# Patient Record
Sex: Female | Born: 1970
Health system: Southern US, Community
[De-identification: ages and names within clinical notes are randomized; demographics above are authoritative.]

## PROBLEM LIST (undated history)

## (undated) DIAGNOSIS — D219 Benign neoplasm of connective and other soft tissue, unspecified: Secondary | ICD-10-CM

## (undated) DIAGNOSIS — E039 Hypothyroidism, unspecified: Secondary | ICD-10-CM

## (undated) DIAGNOSIS — T7840XA Allergy, unspecified, initial encounter: Secondary | ICD-10-CM

## (undated) DIAGNOSIS — E7800 Pure hypercholesterolemia, unspecified: Secondary | ICD-10-CM

## (undated) DIAGNOSIS — D649 Anemia, unspecified: Secondary | ICD-10-CM

## (undated) DIAGNOSIS — E079 Disorder of thyroid, unspecified: Secondary | ICD-10-CM

## (undated) DIAGNOSIS — K5900 Constipation, unspecified: Secondary | ICD-10-CM

## (undated) HISTORY — DX: Anemia, unspecified: D64.9

## (undated) HISTORY — DX: Hypothyroidism, unspecified: E03.9

## (undated) HISTORY — DX: Benign neoplasm of connective and other soft tissue, unspecified: D21.9

## (undated) HISTORY — DX: Allergy, unspecified, initial encounter: T78.40XA

## (undated) HISTORY — DX: Pure hypercholesterolemia, unspecified: E78.00

## (undated) HISTORY — DX: Constipation, unspecified: K59.00

## (undated) HISTORY — PX: COLPOSCOPY: SHX161

## (undated) HISTORY — PX: TUBAL LIGATION: SHX77

## (undated) HISTORY — DX: Disorder of thyroid, unspecified: E07.9

---

## 2003-05-17 ENCOUNTER — Encounter (HOSPITAL_COMMUNITY): Admission: RE | Admit: 2003-05-17 | Discharge: 2003-08-15 | Payer: Self-pay | Admitting: Internal Medicine

## 2003-05-23 ENCOUNTER — Ambulatory Visit (HOSPITAL_COMMUNITY): Admission: RE | Admit: 2003-05-23 | Discharge: 2003-05-23 | Payer: Self-pay | Admitting: Internal Medicine

## 2003-05-24 ENCOUNTER — Encounter (HOSPITAL_COMMUNITY): Admission: RE | Admit: 2003-05-24 | Discharge: 2003-08-22 | Payer: Self-pay | Admitting: Internal Medicine

## 2003-05-24 ENCOUNTER — Encounter: Payer: Self-pay | Admitting: Internal Medicine

## 2003-09-10 DIAGNOSIS — E079 Disorder of thyroid, unspecified: Secondary | ICD-10-CM

## 2003-09-10 HISTORY — DX: Disorder of thyroid, unspecified: E07.9

## 2004-08-09 ENCOUNTER — Other Ambulatory Visit: Admission: RE | Admit: 2004-08-09 | Discharge: 2004-08-09 | Payer: Self-pay | Admitting: Gynecology

## 2005-02-25 ENCOUNTER — Inpatient Hospital Stay (HOSPITAL_COMMUNITY): Admission: AD | Admit: 2005-02-25 | Discharge: 2005-03-01 | Payer: Self-pay | Admitting: Gynecology

## 2005-04-12 ENCOUNTER — Other Ambulatory Visit: Admission: RE | Admit: 2005-04-12 | Discharge: 2005-04-12 | Payer: Self-pay | Admitting: Gynecology

## 2006-04-30 ENCOUNTER — Other Ambulatory Visit: Admission: RE | Admit: 2006-04-30 | Discharge: 2006-04-30 | Payer: Self-pay | Admitting: Gynecology

## 2017-06-28 ENCOUNTER — Ambulatory Visit (INDEPENDENT_AMBULATORY_CARE_PROVIDER_SITE_OTHER): Payer: BLUE CROSS/BLUE SHIELD | Admitting: Emergency Medicine

## 2017-06-28 ENCOUNTER — Encounter: Payer: Self-pay | Admitting: Emergency Medicine

## 2017-06-28 VITALS — BP 110/68 | HR 78 | Temp 98.6°F | Resp 16 | Ht 65.0 in | Wt 169.0 lb

## 2017-06-28 DIAGNOSIS — R399 Unspecified symptoms and signs involving the genitourinary system: Secondary | ICD-10-CM | POA: Diagnosis not present

## 2017-06-28 DIAGNOSIS — N390 Urinary tract infection, site not specified: Secondary | ICD-10-CM | POA: Diagnosis not present

## 2017-06-28 LAB — POCT URINALYSIS DIP (MANUAL ENTRY)
Bilirubin, UA: NEGATIVE
Glucose, UA: NEGATIVE mg/dL
Nitrite, UA: NEGATIVE
Protein Ur, POC: NEGATIVE mg/dL
Spec Grav, UA: >=1.03 — AB (ref 1.010–1.025)
Urobilinogen, UA: 0.2 E.U./dL
pH, UA: 5 (ref 5.0–8.0)

## 2017-06-28 MED ORDER — SULFAMETHOXAZOLE-TRIMETHOPRIM 800-160 MG PO TABS: 1.0000 | ORAL_TABLET | Freq: Two times a day (BID) | ORAL | 0 refills | Status: AC

## 2017-06-28 MED ORDER — FLUCONAZOLE 150 MG PO TABS: 150.0000 mg | ORAL_TABLET | Freq: Once | ORAL | 0 refills | Status: AC

## 2017-06-28 NOTE — Patient Instructions (Signed)
Infeccin urinaria en los adultos  (Urinary Tract Infection, Adult)  Una infeccin urinaria (IU) puede ocurrir en cualquier lugar de las vas urinarias. Las vas urinarias incluyen lo siguiente:   Riones.   Urteres.   Vejiga.   Uretra.  Estos rganos fabrican, almacenan y eliminan la orina del organismo.  CUIDADOS EN EL HOGAR   Tome los medicamentos de venta libre y los recetados solamente como se lo haya indicado el mdico.   Si le recetaron un antibitico, tmelo como se lo haya indicado el mdico. No deje de tomar los antibiticos aunque comience a sentirse mejor.   Evite beber lo siguiente:  ? Alcohol.  ? Cafena.  ? T.  ? Bebidas con gas.   Beba suficiente lquido para mantener el pis claro o de color amarillo plido.   Concurra a todas las visitas de control como se lo haya indicado el mdico. Esto es importante.   Asegrese de lo siguiente:  ? Vaciar la vejiga con frecuencia y en su totalidad. No contener la orina durante largos perodos.  ? Vaciar la vejiga antes y despus de tener relaciones sexuales.  ? Limpiar de adelante hacia atrs despus de defecar, si es mujer. Usar cada trozo de papel una vez cuando se limpie.  SOLICITE AYUDA SI:   Siente dolor en la espalda.   Tiene fiebre.   Siente malestar estomacal (nuseas).   Vomita.   Los sntomas no mejoran despus de 3das de tratamiento.   Los sntomas desaparecen y luego reaparecen.  SOLICITE AYUDA DE INMEDIATO SI:   Siente un dolor muy intenso en la espalda.   Siente un dolor muy intenso en la parte inferior del abdomen.   Tiene vmitos y no puede retener los medicamentos ni el agua.  Esta informacin no tiene como fin reemplazar el consejo del mdico. Asegrese de hacerle al mdico cualquier pregunta que tenga.  Document Released: 02/13/2010 Document Revised: 12/18/2015 Document Reviewed: 07/17/2015  Elsevier Interactive Patient Education  2018 Elsevier Inc.

## 2017-06-28 NOTE — Progress Notes (Signed)
Kelli Hale 46 y.o.   Chief Complaint  Patient presents with  . Urinary Frequency    with vaginal discharge x 2 weeks and hurts to pee    HISTORY OF PRESENT ILLNESS: This is a 46 y.o. female complaining of UTI symptoms x 2 weeks.  Urinary Tract Infection   This is a new problem. The current episode started 1 to 4 weeks ago. The problem occurs every urination. The problem has been gradually worsening. The quality of the pain is described as burning. The pain is at a severity of 5/10. The pain is moderate. There has been no fever. She is not sexually active. There is no history of pyelonephritis. Associated symptoms include a discharge (white), frequency and urgency. Pertinent negatives include no chills, flank pain, hematuria, nausea, possible pregnancy or vomiting. She has tried nothing for the symptoms. There is no history of catheterization, kidney stones, recurrent UTIs or a urological procedure.     Prior to Admission medications   Not on File    No Known Allergies  There are no active problems to display for this patient.   No past medical history on file.  No past surgical history on file.  Social History   Social History  . Marital status: Single    Spouse name: N/A  . Number of children: N/A  . Years of education: N/A   Occupational History  . Not on file.   Social History Main Topics  . Smoking status: Never Smoker  . Smokeless tobacco: Never Used  . Alcohol use No  . Drug use: No  . Sexual activity: Not on file   Other Topics Concern  . Not on file   Social History Narrative  . No narrative on file    No family history on file.   Review of Systems  Constitutional: Negative for chills, diaphoresis and fever.  HENT: Negative.  Negative for sore throat.   Eyes: Negative.   Respiratory: Negative.  Negative for cough and shortness of breath.   Cardiovascular: Negative.  Negative for chest pain and palpitations.  Gastrointestinal:  Negative.  Negative for abdominal pain, blood in stool, diarrhea, melena, nausea and vomiting.  Genitourinary: Positive for dysuria, frequency and urgency. Negative for flank pain and hematuria.  Musculoskeletal: Negative for back pain and myalgias.  Skin: Negative.  Negative for rash.  Neurological: Negative.  Negative for dizziness, sensory change, focal weakness and headaches.  Endo/Heme/Allergies: Negative.   All other systems reviewed and are negative.  Vitals:   06/28/17 0941  BP: 110/68  Pulse: 78  Resp: 16  Temp: 98.6 F (37 C)  SpO2: 98%     Physical Exam  Constitutional: She is oriented to person, place, and time. She appears well-developed and well-nourished.  HENT:  Head: Normocephalic and atraumatic.  Right Ear: External ear normal.  Mouth/Throat: Oropharynx is clear and moist.  Eyes: Pupils are equal, round, and reactive to light. Conjunctivae are normal.  Neck: Normal range of motion. Neck supple.  Cardiovascular: Normal rate, regular rhythm and normal heart sounds.   Pulmonary/Chest: Effort normal and breath sounds normal.  Abdominal: Soft. Bowel sounds are normal. She exhibits no distension. There is no tenderness.  Musculoskeletal: Normal range of motion.  Neurological: She is alert and oriented to person, place, and time. No sensory deficit. She exhibits normal muscle tone.  Skin: Skin is warm and dry. Capillary refill takes less than 2 seconds.  Psychiatric: She has a normal mood and affect. Her behavior is  normal.  Vitals reviewed.    ASSESSMENT & PLAN: Kelli Hale was seen today for urinary frequency.  Diagnoses and all orders for this visit:  Lower urinary tract symptoms (LUTS) -     Urine Culture -     POCT urinalysis dipstick -     fluconazole (DIFLUCAN) 150 MG tablet; Take 1 tablet (150 mg total) by mouth once.  Acute UTI -     sulfamethoxazole-trimethoprim (BACTRIM DS,SEPTRA DS) 800-160 MG tablet; Take 1 tablet by mouth 2 (two) times  daily.    Patient Instructions  Infeccin urinaria en los adultos (Urinary Tract Infection, Adult) Una infeccin urinaria (IU) puede ocurrir en Clinical cytogeneticist de las vas urinarias. Las vas urinarias incluyen lo siguiente:  Riones.  Urteres.  Vejiga.  Uretra. Estos rganos fabrican, Buyer, retail y eliminan la orina del organismo. [place] los medicamentos de venta libre y los recetados solamente como se lo haya indicado el mdico.  Si le recetaron un antibitico, tmelo como se lo haya indicado el mdico. No deje de tomar los antibiticos aunque comience a sentirse mejor.  Evite beber lo siguiente: ? Alcohol. ? Cafena. ? T. ? Bebidas con gas.  Beba suficiente lquido para mantener el pis claro o de color amarillo plido.  Concurra a todas las visitas de control como se lo haya indicado el mdico. Esto es importante.  Asegrese de lo siguiente: ? Vaciar la vejiga con frecuencia y en su totalidad. No contener la orina durante largos perodos. ? Vaciar la vejiga antes y despus de Clinical biochemist. ? Limpiar de adelante hacia atrs despus de defecar, si es mujer. Usar cada trozo de papel una vez cuando se limpie. SOLICITE AYUDA SI:  Siente dolor en la espalda.  Tiene fiebre.  Siente malestar estomacal (nuseas).  Vomita.  Los sntomas no mejoran despus de 3das de tratamiento.  Los sntomas desaparecen y Teacher, adult education. SOLICITE AYUDA DE INMEDIATO SI:  Siente un dolor muy intenso en la espalda.  Siente un dolor muy intenso en la parte inferior del abdomen.  Tiene vmitos y no puede retener los medicamentos ni el agua. Esta informacin no tiene Marine scientist el consejo del mdico. Asegrese de hacerle al mdico cualquier pregunta que tenga. Document Released: 02/13/2010 Document Revised: 12/18/2015 Document Reviewed: 07/17/2015 Elsevier Interactive Patient Education  2018 Elsevier Inc.      Agustina Caroli,  MD [facility]

## 2017-06-29 LAB — URINE CULTURE

## 2017-07-01 ENCOUNTER — Encounter: Payer: Self-pay | Admitting: Radiology

## 2017-09-09 DIAGNOSIS — E039 Hypothyroidism, unspecified: Secondary | ICD-10-CM

## 2017-09-09 HISTORY — DX: Hypothyroidism, unspecified: E03.9

## 2018-01-02 ENCOUNTER — Other Ambulatory Visit: Payer: Self-pay

## 2018-01-02 ENCOUNTER — Encounter: Payer: Self-pay | Admitting: Emergency Medicine

## 2018-01-02 ENCOUNTER — Ambulatory Visit (INDEPENDENT_AMBULATORY_CARE_PROVIDER_SITE_OTHER): Payer: BLUE CROSS/BLUE SHIELD

## 2018-01-02 ENCOUNTER — Ambulatory Visit (INDEPENDENT_AMBULATORY_CARE_PROVIDER_SITE_OTHER): Payer: BLUE CROSS/BLUE SHIELD | Admitting: Emergency Medicine

## 2018-01-02 VITALS — BP 110/80 | HR 63 | Temp 98.7°F | Resp 16 | Ht 64.5 in | Wt 165.2 lb

## 2018-01-02 DIAGNOSIS — R1010 Upper abdominal pain, unspecified: Secondary | ICD-10-CM | POA: Diagnosis not present

## 2018-01-02 DIAGNOSIS — Z23 Encounter for immunization: Secondary | ICD-10-CM

## 2018-01-02 LAB — POCT CBC
Granulocyte percent: 58 %G (ref 37–80)
HCT, POC: 31.9 % — AB (ref 37.7–47.9)
Hemoglobin: 10 g/dL — AB (ref 12.2–16.2)
Lymph, poc: 2.2 (ref 0.6–3.4)
MCH, POC: 21.5 pg — AB (ref 27–31.2)
MCHC: 31.4 g/dL — AB (ref 31.8–35.4)
MCV: 68.4 fL — AB (ref 80–97)
MID (cbc): 0.3 (ref 0–0.9)
MPV: 8.1 fL (ref 0–99.8)
POC Granulocyte: 3.4 (ref 2–6.9)
POC LYMPH PERCENT: 36.5 %L (ref 10–50)
POC MID %: 5.5 %M (ref 0–12)
Platelet Count, POC: 317 10*3/uL (ref 142–424)
RBC: 4.67 M/uL (ref 4.04–5.48)
RDW, POC: 18.1 %
WBC: 5.9 10*3/uL (ref 4.6–10.2)

## 2018-01-02 LAB — POCT URINALYSIS DIP (MANUAL ENTRY)
Bilirubin, UA: NEGATIVE
Blood, UA: NEGATIVE
Glucose, UA: NEGATIVE mg/dL
Ketones, POC UA: NEGATIVE mg/dL
Nitrite, UA: NEGATIVE
Protein Ur, POC: NEGATIVE mg/dL
Spec Grav, UA: 1.025 (ref 1.010–1.025)
Urobilinogen, UA: 0.2 E.U./dL
pH, UA: 6 (ref 5.0–8.0)

## 2018-01-02 MED ORDER — OMEPRAZOLE 40 MG PO CPDR
40.0000 mg | DELAYED_RELEASE_CAPSULE | Freq: Every day | ORAL | 3 refills | Status: DC
Start: 2018-01-02 — End: 2018-01-16

## 2018-01-02 NOTE — Progress Notes (Signed)
Kelli Hale 47 y.o.   Chief Complaint  Patient presents with  . Abdominal Pain    x 8 days  . Nausea    HISTORY OF PRESENT ILLNESS: This is a 47 y.o. female complaining of upper abdominal pain for the past 7 to 8 days with nausea.  Has fullness sensation in the upper abdomen with early satiety.  Denies vomiting.  Denies diarrhea.  Denies rectal bleeding.  No significant past medical history.  On no medications.  Non-smoker.  [place] abuser.  Has a history of anemia.  Denies melena.  Abdominal Pain  This is a new problem. The current episode started 1 to 4 weeks ago. The onset quality is gradual. The problem occurs intermittently. The problem has been waxing and waning. The pain is located in the epigastric region. The pain is at a severity of 5/10. The pain is moderate. The quality of the pain is dull and a sensation of fullness. The abdominal pain does not radiate. Associated symptoms include nausea. Pertinent negatives include no anorexia, diarrhea, dysuria, fever, frequency, headaches, hematochezia, hematuria, melena, myalgias, vomiting or weight loss. The pain is aggravated by eating. The pain is relieved by nothing. She has tried nothing for the symptoms. There is no history of abdominal surgery, colon cancer, Crohn's disease, gallstones, GERD, pancreatitis, PUD or ulcerative colitis.     Prior to Admission medications   Not on File    No Known Allergies  Patient Active Problem List   Diagnosis Date Noted  . Lower urinary tract symptoms (LUTS) 06/28/2017  . Acute UTI 06/28/2017    No past medical history on file.    Social History   Socioeconomic History  . Marital status: Single    Spouse name: Not on file  . Number of children: Not on file  . Years of education: Not on file  . Highest education level: Not on file  Occupational History  . Not on file  Social Needs  . Financial resource strain: Not on file  . Food insecurity:    Worry: Not on file   Inability: Not on file  . Transportation needs:    Medical: Not on file    Non-medical: Not on file  Tobacco Use  . Smoking status: Never Smoker  . Smokeless tobacco: Never Used  Substance and Sexual Activity  . Alcohol use: No  . Drug use: No  . Sexual activity: Not on file  Lifestyle  . Physical activity:    Days per week: Not on file    Minutes per session: Not on file  . Stress: Not on file  Relationships  . Social connections:    Talks on phone: Not on file    Gets together: Not on file    Attends religious service: Not on file    Active member of club or organization: Not on file    Attends meetings of clubs or organizations: Not on file    Relationship status: Not on file  . Intimate partner violence:    Fear of current or ex partner: Not on file    Emotionally abused: Not on file    Physically abused: Not on file    Forced sexual activity: Not on file  Other Topics Concern  . Not on file  Social History Narrative  . Not on file    Family History  Problem Relation Age of Onset  . Heart disease Mother      Review of Systems  Constitutional: Negative.  Negative  for chills, fever and weight loss.  HENT: Negative.  Negative for nosebleeds, sinus pain and sore throat.   Eyes: Negative.  Negative for blurred vision and double vision.  Respiratory: Negative.  Negative for cough, hemoptysis and shortness of breath.   Cardiovascular: Negative.  Negative for chest pain, palpitations, claudication and leg swelling.  Gastrointestinal: Positive for abdominal pain and nausea. Negative for anorexia, blood in stool, diarrhea, heartburn, hematochezia, melena and vomiting.  Genitourinary: Negative.  Negative for dysuria, frequency and hematuria.  Musculoskeletal: Negative for back pain, myalgias and neck pain.  Skin: Negative.  Negative for rash.  Neurological: Negative.  Negative for dizziness and headaches.  Endo/Heme/Allergies: Negative.   All other systems reviewed and  are negative.   Vitals:   01/02/18 1629  BP: 110/80  Pulse: 63  Resp: 16  Temp: 98.7 F (37.1 C)  SpO2: 98%    Physical Exam  Constitutional: She is oriented to person, place, and time. She appears well-developed and well-nourished.  HENT:  Head: Normocephalic and atraumatic.  Nose: Nose normal.  Mouth/Throat: Oropharynx is clear and moist.  Eyes: Pupils are equal, round, and reactive to light. Conjunctivae and EOM are normal.  Neck: Normal range of motion. Neck supple. No JVD present. No thyromegaly present.  Cardiovascular: Normal rate, regular rhythm and normal heart sounds.  Pulmonary/Chest: Effort normal and breath sounds normal. No respiratory distress.  Abdominal: Soft. Normal appearance and bowel sounds are normal. She exhibits no distension, no abdominal bruit, no ascites and no mass. There is no hepatosplenomegaly. There is tenderness in the epigastric area. There is no rigidity, no rebound, no guarding and no CVA tenderness.  Musculoskeletal: Normal range of motion.  Lymphadenopathy:    She has no cervical adenopathy.  Neurological: She is alert and oriented to person, place, and time. No sensory deficit. She exhibits normal muscle tone.  Skin: Skin is warm and dry. Capillary refill takes less than 2 seconds. No rash noted.  Psychiatric: She has a normal mood and affect. Her behavior is normal.  Vitals reviewed.  Results for orders placed or performed in visit on 01/02/18 (from the past 24 hour(s))  POCT CBC     Status: Abnormal   Collection Time: 01/02/18  5:05 PM  Result Value Ref Range   WBC 5.9 4.6 - 10.2 K/uL   Lymph, poc 2.2 0.6 - 3.4   POC LYMPH PERCENT 36.5 10 - 50 %L   MID (cbc) 0.3 0 - 0.9   POC MID % 5.5 0 - 12 %M   POC Granulocyte 3.4 2 - 6.9   Granulocyte percent 58.0 37 - 80 %G   RBC 4.67 4.04 - 5.48 M/uL   Hemoglobin 10.0 (A) 12.2 - 16.2 g/dL   HCT, POC 31.9 (A) 37.7 - 47.9 %   MCV 68.4 (A) 80 - 97 fL   MCH, POC 21.5 (A) 27 - 31.2 pg   MCHC  31.4 (A) 31.8 - 35.4 g/dL   RDW, POC 18.1 %   Platelet Count, POC 317 142 - 424 K/uL   MPV 8.1 0 - 99.8 fL  POCT urinalysis dipstick     Status: Abnormal   Collection Time: 01/02/18  5:08 PM  Result Value Ref Range   Color, UA yellow yellow   Clarity, UA clear clear   Glucose, UA negative negative mg/dL   Bilirubin, UA negative negative   Ketones, POC UA negative negative mg/dL   Spec Grav, UA 1.025 1.010 - 1.025   Blood, UA  negative negative   pH, UA 6.0 5.0 - 8.0   Protein Ur, POC negative negative mg/dL   Urobilinogen, UA 0.2 0.2 or 1.0 E.U./dL   Nitrite, UA Negative Negative   Leukocytes, UA Trace (A) Negative   Dg Abd Acute W/chest  Result Date: 01/02/2018 CLINICAL DATA:  Upper abdominal pain. EXAM: DG ABDOMEN ACUTE W/ 1V CHEST COMPARISON:  No recent. FINDINGS: Mediastinum hilar structures normal. Lungs are clear. No pleural effusion or pneumothorax. Heart size normal. Soft tissue structures the abdomen are unremarkable. No bowel distention. Stool noted throughout the colon. Faint opacity noted over the left upper abdomen most likely represents debris in the stomach. Mild thoracolumbar spine scoliosis.  No acute bony abnormality. IMPRESSION: 1.  No acute cardiopulmonary disease. 2.  No acute intra-abdominal abnormality. Electronically Signed   By: Marcello Moores  Register   On: 01/02/2018 17:07   A total of 40 minutes was spent in the room with the patient, greater than 50% of which was in counseling/coordination of care regarding differential diagnosis, management, medications, diet, diagnostic imaging, and follow-up with GI.  Blood work and x-rays reviewed with the patient in the room.   ASSESSMENT & PLAN: [name] was seen today for abdominal pain and nausea.  Diagnoses and all orders for this visit:  Upper abdominal pain -     POCT CBC -     POCT urinalysis dipstick -     Comprehensive metabolic panel -     Lipase -     DG Abd Acute W/Chest; Future -     omeprazole (PRILOSEC) 40  MG capsule; Take 1 capsule (40 mg total) by mouth daily. -     Ambulatory referral to Gastroenterology  Need for diphtheria-tetanus-pertussis (Tdap) vaccine -     Tdap vaccine greater than or equal to 7yo IM    Patient Instructions       IF you received an x-ray today, you will receive an invoice from [facility] Rad[facility]. Please contact [facility] Radiology at [phone] with questions or concerns regarding your invoice.   IF you received labwork today, you will receive an invoice from [place]. Please contact [facility] at [phone] with questions or concerns regarding your invoice.   Our billing staff will not be able to assist you with questions regarding bills from these companies.  You will be contacted with the lab results as soon as they are available. The fastest way to get your results is to activate your My Chart account. Instructions are located on the last page of this paperwork. If you have not heard from [place] regarding the results in 2 weeks, please contact this office.     Dolor abdominal en los adultos Abdominal Pain, Adult El dolor de [place] (abdominal) puede tener muchas causas. [place] veces, el dolor de [place] no es peligroso. Muchos de [employer] de dolor de estmago pueden controlarse y tratarse en casa. Sin embargo, a Clinical cytogeneticist, Conservation officer, historic buildings de [employer] puede ser grave. El mdico intentar descubrir la causa del dolor de [place]. Siga estas indicaciones en su casa:  [place] los medicamentos de venta libre y los recetados solamente como se lo haya indicado el mdico. No tome medicamentos que lo ayuden a Landscape architect (laxantes), salvo que el mdico se lo indique.  Beba suficiente lquido para mantener el pis (orina) claro o de color amarillo plido.  Est atento al dolor de estmago para Actuary cambio.  Concurra a todas las visitas de control como se lo haya indicado el mdico. Esto  es importante. Comunquese con un mdico si:  El dolor de  estmago cambia o [place].  No tiene apetito o baja de peso sin proponrselo.  Tiene dificultades para defecar (est estreido) o heces lquidas (diarrea) durante ms de 2 o 3das.  Siente dolor al orinar o defecar.  El dolor de estmago lo despierta de noche.  El dolor empeora con las comidas, despus de comer o con determinados alimentos.  Vomita y no puede retener nada de lo que ingiere.  Tiene fiebre. Solicite ayuda de inmediato si:  El dolor no desaparece en el tiempo indicado por el mdico.  No puede detener los vmitos.  Siente dolor solamente en zonas especficas del abdomen, como el lado derecho o la parte inferior izquierda.  Tiene heces con sangre, de color negro o con aspecto alquitranado.  Tiene dolor muy intenso en el vientre, clicos o meteorismo.  Presenta signos de no tener suficientes lquidos o agua en el cuerpo (deshidratacin), por ejemplo: ? [place] es [place], es muy escasa o no orina. ? Labios agrietados. ? [employer]. ? Ojos hundidos. ? Somnolencia. ? Debilidad. Esta informacin no tiene Marine scientist el consejo del mdico. Asegrese de hacerle al mdico cualquier pregunta que tenga. Document Released: 11/22/2008 Document Revised: 11/20/2016 Document Reviewed: 02/07/2016 Elsevier Interactive Patient Education  2018 Elsevier Inc.      Agustina Caroli, MD [facility]

## 2018-01-02 NOTE — Patient Instructions (Addendum)
     IF you received an x-ray today, you will receive an invoice from [facility] Radiology. Please contact [facility] Radiology at [phone] with questions or concerns regarding your invoice.   IF you received labwork today, you will receive an invoice from [place]. Please contact [facility] at [phone] with questions or concerns regarding your invoice.   Our billing staff will not be able to assist you with questions regarding bills from these companies.  You will be contacted with the lab results as soon as they are available. The fastest way to get your results is to activate your My Chart account. Instructions are located on the last page of this paperwork. If you have not heard from [place] regarding the results in 2 weeks, please contact this office.     Dolor abdominal en los adultos Abdominal Pain, Adult El dolor de [place] (abdominal) puede tener muchas causas. [place] veces, el dolor de [place] no es peligroso. Muchos de [employer] de dolor de estmago pueden controlarse y tratarse en casa. Sin embargo, a Clinical cytogeneticist, Conservation officer, historic buildings de [employer] puede ser grave. El mdico intentar descubrir la causa del dolor de [place]. Siga estas indicaciones en su casa:  Tome los medicamentos de venta libre y los recetados solamente como se lo haya indicado el mdico. No tome medicamentos que lo ayuden a Landscape architect (laxantes), salvo que el mdico se lo indique.  Beba suficiente lquido para mantener el pis (orina) claro o de color amarillo plido.  Est atento al dolor de estmago para [employer]uary cambio.  Concurra a todas las visitas de control como se lo haya indicado el mdico. Esto es importante. Comunquese con un mdico si:  El dolor de estmago cambia o [place].  No tiene apetito o baja de peso sin proponrselo.  Tiene dificultades para defecar (est estreido) o heces lquidas (diarrea) durante ms de 2 o 3das.  Siente dolor al orinar o defecar.  El dolor de estmago  lo despierta de noche.  El dolor empeora con las comidas, despus de comer o con determinados alimentos.  Vomita y no puede retener nada de lo que ingiere.  Tiene fiebre. Solicite ayuda de inmediato si:  El dolor no desaparece en el tiempo indicado por el mdico.  No puede detener los vmitos.  Siente dolor solamente en zonas especficas del abdomen, como el lado derecho o la parte inferior izquierda.  Tiene heces con sangre, de color negro o con aspecto alquitranado.  Tiene dolor muy intenso en el vientre, clicos o meteorismo.  Presenta signos de no tener suficientes lquidos o agua en el cuerpo (deshidratacin), por ejemplo: ? [place] es [place], es muy escasa o no orina. ? Labios agrietados. ? [employer]. ? Ojos hundidos. ? Somnolencia. ? Debilidad. Esta informacin no tiene Marine scientist el consejo del mdico. Asegrese de hacerle al mdico cualquier pregunta que tenga. Document Released: 11/22/2008 Document Revised: 11/20/2016 Document Reviewed: 02/07/2016 Elsevier Interactive Patient Education  [employer].

## 2018-01-03 ENCOUNTER — Encounter: Payer: Self-pay | Admitting: Radiology

## 2018-01-03 LAB — COMPREHENSIVE METABOLIC PANEL
ALT: 17 IU/L (ref 0–32)
AST: 24 IU/L (ref 0–40)
Albumin/Globulin Ratio: 1.6 (ref 1.2–2.2)
Albumin: 4.4 g/dL (ref 3.5–5.5)
Alkaline Phosphatase: 88 IU/L (ref 39–117)
BUN/Creatinine Ratio: 19 (ref 9–23)
BUN: 15 mg/dL (ref 6–24)
Bilirubin Total: 0.3 mg/dL (ref 0.0–1.2)
CO2: 24 mmol/L (ref 20–29)
Calcium: 8.9 mg/dL (ref 8.7–10.2)
Chloride: 104 mmol/L (ref 96–106)
Creatinine, Ser: 0.77 mg/dL (ref 0.57–1.00)
GFR calc Af Amer: 107 mL/min/{1.73_m2} (ref >59)
GFR calc non Af Amer: 93 mL/min/{1.73_m2} (ref >59)
Globulin, Total: 2.8 g/dL (ref 1.5–4.5)
Glucose: 85 mg/dL (ref 65–99)
Potassium: 3.6 mmol/L (ref 3.5–5.2)
Sodium: 140 mmol/L (ref 134–144)
Total Protein: 7.2 g/dL (ref 6.0–8.5)

## 2018-01-03 LAB — LIPASE: Lipase: 34 U/L (ref 14–72)

## 2018-01-16 ENCOUNTER — Encounter: Payer: Self-pay | Admitting: Gastroenterology

## 2018-01-16 ENCOUNTER — Other Ambulatory Visit: Payer: Self-pay

## 2018-01-16 ENCOUNTER — Ambulatory Visit (AMBULATORY_SURGERY_CENTER): Payer: BLUE CROSS/BLUE SHIELD | Admitting: Gastroenterology

## 2018-01-16 ENCOUNTER — Other Ambulatory Visit (INDEPENDENT_AMBULATORY_CARE_PROVIDER_SITE_OTHER): Payer: BLUE CROSS/BLUE SHIELD

## 2018-01-16 ENCOUNTER — Ambulatory Visit (INDEPENDENT_AMBULATORY_CARE_PROVIDER_SITE_OTHER): Payer: BLUE CROSS/BLUE SHIELD | Admitting: Gastroenterology

## 2018-01-16 VITALS — BP 110/76 | HR 60 | Ht 65.0 in | Wt 164.4 lb

## 2018-01-16 VITALS — BP 127/81 | HR 52 | Temp 98.4°F | Resp 13 | Ht 65.0 in | Wt 164.0 lb

## 2018-01-16 DIAGNOSIS — R1013 Epigastric pain: Secondary | ICD-10-CM | POA: Diagnosis not present

## 2018-01-16 DIAGNOSIS — D649 Anemia, unspecified: Secondary | ICD-10-CM | POA: Diagnosis not present

## 2018-01-16 DIAGNOSIS — K5900 Constipation, unspecified: Secondary | ICD-10-CM

## 2018-01-16 DIAGNOSIS — K2950 Unspecified chronic gastritis without bleeding: Secondary | ICD-10-CM | POA: Diagnosis not present

## 2018-01-16 HISTORY — PX: UPPER GASTROINTESTINAL ENDOSCOPY: SHX188

## 2018-01-16 LAB — FERRITIN: Ferritin: 2.6 ng/mL — ABNORMAL LOW (ref 10.0–291.0)

## 2018-01-16 MED ORDER — SODIUM CHLORIDE 0.9 % IV SOLN: 500.0000 mL | Freq: Once | INTRAVENOUS | Status: DC

## 2018-01-16 MED ORDER — OMEPRAZOLE 40 MG PO CPDR: 40.0000 mg | DELAYED_RELEASE_CAPSULE | Freq: Two times a day (BID) | ORAL | 3 refills | Status: DC

## 2018-01-16 MED ORDER — FERROUS SULFATE 325 (65 FE) MG PO TABS: 325.0000 mg | ORAL_TABLET | Freq: Two times a day (BID) | ORAL | 3 refills | Status: DC

## 2018-01-16 NOTE — Op Note (Signed)
[place] Patient Name: Kelli Hale Procedure Date: 01/16/2018 3:36 PM MRN: [id number] Endoscopist: Remo Lipps P. Armbruster MD, MD Age: 47 Referring MD:  Date of Birth: 06-24-1971 Gender: Female Account #: [id number] Procedure:                Upper GI endoscopy Indications:              Epigastric abdominal pain, Iron deficiency anemia Medicines:                Monitored Anesthesia Care Procedure:                Pre-Anesthesia Assessment:                           - Prior to the procedure, a History and Physical                            was performed, and patient medications and                            allergies were reviewed. The patient's tolerance of                            previous anesthesia was also reviewed. The risks                            and benefits of the procedure and the sedation                            options and risks were discussed with the patient.                            All questions were answered, and informed consent                            was obtained. Prior Anticoagulants: The patient has                            taken no previous anticoagulant or antiplatelet                            agents. ASA Grade Assessment: II - A patient with                            mild systemic disease. After reviewing the risks                            and benefits, the patient was deemed in                            satisfactory condition to undergo the procedure.                           After obtaining informed consent, the endoscope was  passed under direct vision. Throughout the                            procedure, the patient's blood pressure, pulse, and                            oxygen saturations were monitored continuously. The                            Endoscope was introduced through the mouth, and                            advanced to the second part of duodenum. The upper            GI endoscopy was accomplished without difficulty.                            The patient tolerated the procedure well. Scope In: Scope Out: Findings:                 Esophagogastric landmarks were identified: the                            Z-line was found at 37 cm, the gastroesophageal                            junction was found at 37 cm and the upper extent of                            the gastric folds was found at 37 cm from the                            incisors.                           The exam of the esophagus was otherwise normal.                           The entire examined stomach was normal. Biopsies                            were taken from the antrum, body, and incisura with                            a cold forceps for Helicobacter pylori testing.                           The duodenal bulb and second portion of the                            duodenum were normal. Complications:            No immediate complications. Estimated blood loss:  Minimal. Estimated Blood Loss:     Estimated blood loss was minimal. Impression:               - Esophagogastric landmarks identified.                           - Normal esophagus.                           - Normal stomach. Biopsied to rule out H pylori                            given iron deficiency / epigastric pain.                           - Normal duodenal bulb and second portion of the                            duodenum.                           No overt cause for pain or iron deficiency anemia                            on this exam. Recommendation:           - Patient has a contact number available for                            emergencies. The signs and symptoms of potential                            delayed complications were discussed with the                            patient. Return to normal activities tomorrow.                            Written discharge instructions were  provided to the                            patient.                           - Resume previous diet.                           - Continue present medications.                           - Start ferrous sulfate 325mg  twice daily, Miralax                            daily for constipation                           - Await pathology results.                           -  Recommend US of the RUQ to assess for gallstones                           - Recommend colonoscopy to clear the colon in                            regards to iron deficiency. It's possible the                            anemia is otherwise due to menstrual blood loss.                            Will discuss with patient Kelli Hale. Armbruster MD, MD 01/16/2018 4:01:12 PM This report has been signed electronically.

## 2018-01-16 NOTE — Patient Instructions (Signed)
YOU HAD AN ENDOSCOPIC PROCEDURE TODAY AT [facility]:   Refer to the procedure report that was given to you for any specific questions about what was found during the examination.  If the procedure report does not answer your questions, please call your gastroenterologist to clarify.  If you requested that your care partner not be given the details of your procedure findings, then the procedure report has been included in a sealed envelope for you to review at your convenience later.  YOU SHOULD EXPECT: Some feelings of bloating in the abdomen. Passage of more gas than usual.  Walking can help get rid of the air that was put into your GI tract during the procedure and reduce the bloating. If you had a lower endoscopy (such as a colonoscopy or flexible sigmoidoscopy) you may notice spotting of blood in your stool or on the toilet paper. If you underwent a bowel prep for your procedure, you may not have a normal bowel movement for a few days.  Please Note:  You might notice some irritation and congestion in your nose or some drainage.  This is from the oxygen used during your procedure.  There is no need for concern and it should clear up in a day or so.  SYMPTOMS TO REPORT IMMEDIATELY:    Following upper endoscopy (EGD)  Vomiting of blood or coffee ground material  New chest pain or pain under the shoulder blades  Painful or persistently difficult swallowing  New shortness of breath  Fever of 100F or higher  Black, tarry-looking stools  For urgent or emergent issues, a gastroenterologist can be reached at any hour by calling [phone].   DIET:  We do recommend a small meal at first, but then you may proceed to your regular diet.  Drink plenty of fluids but you should avoid alcoholic beverages for 24 hours.  ACTIVITY:  You should plan to take it easy for the rest of today and you should NOT DRIVE or use heavy machinery until tomorrow (because of the sedation medicines used  during the test).    FOLLOW UP: Our staff will call the number listed on your records the next business day following your procedure to check on you and address any questions or concerns that you may have regarding the information given to you following your procedure. If we do not reach you, we will leave a message.  However, if you are feeling well and you are not experiencing any problems, there is no need to return our call.  We will assume that you have returned to your regular daily activities without incident.  If any biopsies were taken you will be contacted by phone or by letter within the next 1-3 weeks.  Please call us at [phone] if you have not heard about the biopsies in 3 weeks.    SIGNATURES/CONFIDENTIALITY: You and/or your care partner have signed paperwork which will be entered into your electronic medical record.  These signatures attest to the fact that that the information above on your After Visit Summary has been reviewed and is understood.  Full responsibility of the confidentiality of this discharge information lies with you and/or your care-partner.   Ferrous sulfate twice a day with meals. Use Miralax daily.

## 2018-01-16 NOTE — Patient Instructions (Addendum)
If you are age 47 or older, your body mass index should be between 23-30. Your Body mass index is 27.35 kg/m. If this is out of the aforementioned range listed, please consider follow up with your Primary Care Provider.  If you are age 48 or younger, your body mass index should be between 19-25. Your Body mass index is 27.35 kg/m. If this is out of the aformentioned range listed, please consider follow up with your Primary Care Provider.   You have been scheduled for an endoscopy. Please follow written instructions given to you at your visit today. If you use inhalers (even only as needed), please bring them with you on the day of your procedure. Your physician has requested that you go to www.startemmi.com and enter the access code given to you at your visit today. This web site gives a general overview about your procedure. However, you should still follow specific instructions given to you by our office regarding your preparation for the procedure.  Take Omeprazole 40 mg twice a day.  Please purchase the following medications over the counter and take as directed: Miralax  Please go to the lab in the basement of our building to have lab work done as you leave today.   Thank you for entrusting me with your care and for choosing [facility], Dr. [place] Cellar

## 2018-01-16 NOTE — Progress Notes (Signed)
Her daughter [name] is interpreting for her

## 2018-01-16 NOTE — Progress Notes (Signed)
Called to room to assist during endoscopic procedure.  Patient ID and intended procedure confirmed with present staff. Received instructions for my participation in the procedure from the performing physician.  

## 2018-01-16 NOTE — Progress Notes (Signed)
HPI :  47 y/o healthy female referred here for a new patient visit for abdominal discomfort by Dr. Agustina Caroli.  She has been experiencing epigastric discomfort for the past 3-4 weeks. She states it feels like a "pressure" in her abdomen after she eats. This is effecting how she is eating and bothering her. She feels it in the epigastric area, does not radiate to the RUQ, but can feel it in her back a little bit. She endorses nausea with eating but no vomiting. Eating reliably produces her symptoms, which can then last for several hours. She has the pain on most days. It does not wake her up at night. She was placed on omeprazole 40mg  / day for the past week which did seem to help too much so far. On review of labs she has a microcytic anemia, Hgb of 10s with MCV of 68. She's had no iron levels tested.She does endorse some constipation at baseline, she does not take anything for this. No blood in the stools. She has regular menses which can be heavy at times. They are uncertain about how long this anemia has been going on. No weight loss. She has not had H pylori testing. She denies any reflux symptoms / heartburn. No dysphagia. No family history of cancer. Her gallbladder is in place.      History reviewed. No pertinent past medical history.   Past Surgical History:  Procedure Laterality Date  . CESAREAN SECTION     Family History  Problem Relation Age of Onset  . Heart disease Mother    Social History   Tobacco Use  . Smoking status: Never Smoker  . Smokeless tobacco: Never Used  Substance Use Topics  . Alcohol use: No  . Drug use: No   Current Outpatient Medications  Medication Sig Dispense Refill  . omeprazole (PRILOSEC) 40 MG capsule Take 1 capsule (40 mg total) by mouth daily. 30 capsule 3   No current facility-administered medications for this visit.    No Known Allergies   Review of Systems: All systems reviewed and negative except where noted in HPI.    Dg  Abd Acute W/chest  Result Date: 01/02/2018 CLINICAL DATA:  Upper abdominal pain. EXAM: DG ABDOMEN ACUTE W/ 1V CHEST COMPARISON:  No recent. FINDINGS: Mediastinum hilar structures normal. Lungs are clear. No pleural effusion or pneumothorax. Heart size normal. Soft tissue structures the abdomen are unremarkable. No bowel distention. Stool noted throughout the colon. Faint opacity noted over the left upper abdomen most likely represents debris in the stomach. Mild thoracolumbar spine scoliosis.  No acute bony abnormality. IMPRESSION: 1.  No acute cardiopulmonary disease. 2.  No acute intra-abdominal abnormality. Electronically Signed   By: Marcello Moores  Register   On: 01/02/2018 17:07   Lab Results  Component Value Date   WBC 5.9 01/02/2018   HGB 10.0 (A) 01/02/2018   HCT 31.9 (A) 01/02/2018   MCV 68.4 (A) 01/02/2018    Lab Results  Component Value Date   CREATININE 0.77 01/02/2018   BUN 15 01/02/2018   NA 140 01/02/2018   K 3.6 01/02/2018   CL 104 01/02/2018   CO2 24 01/02/2018    Lab Results  Component Value Date   ALT 17 01/02/2018   AST 24 01/02/2018   ALKPHOS 88 01/02/2018   BILITOT 0.3 01/02/2018      Physical Exam: BP 110/76 (BP Location: Left Arm, Patient Position: Sitting, Cuff Size: Normal)   Pulse 60   Ht 5\' 5"  (  1.651 m) Comment: height measured without shoes  Wt 164 lb 6 oz (74.6 kg)   LMP 01/07/2018   Breastfeeding? Unknown   BMI 27.35 kg/m  Constitutional: Pleasant,well-developed, female in no acute distress. HEENT: Normocephalic and atraumatic. Conjunctivae are normal. No scleral icterus. Neck supple.  Cardiovascular: Normal rate, regular rhythm.  Pulmonary/chest: Effort normal and breath sounds normal. No wheezing, rales or rhonchi. Abdominal: Soft, nondistended, mild epigastric TTP without rebound. There are no masses palpable. No hepatomegaly. Extremities: no edema Lymphadenopathy: No cervical adenopathy noted. Neurological: Alert and oriented to person  place and time. Skin: Skin is warm and dry. No rashes noted. Psychiatric: Normal mood and affect. Behavior is normal.   ASSESSMENT AND PLAN: 47 y/o female with persistent epigastric pain for the past 3-4 weeks, reliably reproduced with eating. She's also noted to be anemic with microcytosis, concerning for iron deficiency. I discussed ddx with her and daughter to include PUD, H pylori, biliary colic, other gastric lesion, etc. Given her anemia, while it's possible this could be related to menses, I'm recommending we initially evaluate with an EGD to assess for PUD / gastritis / mass lesion. I discussed risks / benefits of EGD and anesthesia with the patient and her daughter who provided translation, who agrees to proceed. I will send her to the lab to check iron studies today and replete if deficient. While we await EGD she will increase omeprazole to BID. For her constipation, recommend a trial of Miralax daily and titrate up as needed. If her EGD is negative, will then proceed with an [place] to assess for gallstones although duration of how long her pain lasts is a bit atypical for biliary colic. She agreed with the plan. Further recommendations pending the results and her course.   Peoa Cellar, MD [facility]  CC: Mitchel [place]our [place]

## 2018-01-16 NOTE — Progress Notes (Signed)
Report to RN, VSS, adequate respirations noted, no c/o pain or discomfort 

## 2018-01-17 LAB — IRON AND TIBC
Iron Saturation: 4 % — CL (ref 15–55)
Iron: 18 ug/dL — ABNORMAL LOW (ref 27–159)
Total Iron Binding Capacity: 439 ug/dL (ref 250–450)
UIBC: 421 ug/dL (ref 131–425)

## 2018-01-19 ENCOUNTER — Telehealth: Payer: Self-pay | Admitting: *Deleted

## 2018-01-19 NOTE — Telephone Encounter (Signed)
  Follow up Call-  Call back number 01/16/2018  Post procedure Call Back phone  # [phone]- her daughter Lenn Sink number  Permission to leave phone message Yes  Some recent data might be hidden     Patient questions:  Do you have a fever, pain , or abdominal swelling? No. Pain Score  0 *  Have you tolerated food without any problems? Yes.    Have you been able to return to your normal activities? Yes.    Do you have any questions about your discharge instructions: Diet   No. Medications  No. Follow up visit  No.  Do you have questions or concerns about your Care? No.  Actions: * If pain score is 4 or above: No action needed, pain <4.

## 2018-01-21 ENCOUNTER — Telehealth: Payer: Self-pay | Admitting: Gastroenterology

## 2018-01-21 NOTE — Telephone Encounter (Signed)
I have relayed EGD biopsy results to patient. Patient states she is taking her Iron and is already taking Miralax. She would like to schedule [place] preferably on 01/26/18 or 01/30/18. Pt also states she would like to think about scheduling colonoscopy and will let me know what she decides after hearing about [place] appointment.

## 2018-01-22 NOTE — Telephone Encounter (Signed)
This is patient that Lorriane Shire called with test results. States she is not having any pain but feels hot like she may have a fever. Please advise.

## 2018-01-22 NOTE — Telephone Encounter (Signed)
If she feels like she is having a fever she should get a thermometer and take her temperature. If she is having a fever without other symptoms, she can see her primary care for evaluation of that.  Colonoscopy is recommended given her iron deficiency with negative EGD.  [place] was recommended to evaluate for gallstones for her history of upper abdominal discomfort. If her pain has completely resolved and no longer having it, we can hold off on that. thanks

## 2018-01-22 NOTE — Telephone Encounter (Signed)
Kelli Hale, please see below. Can you please explain this to the patient that Dr. Havery Moros is recommending the colonoscopy to evaluate for iron deficiency and schedule, if she is willing, along with a pre-visit appointment.

## 2018-01-22 NOTE — Telephone Encounter (Signed)
Is she having right upper quadrant pain? Or any abdominal pain?

## 2018-01-22 NOTE — Telephone Encounter (Signed)
Patient states she is not having any pain but states she feels very hot like she may have a fever.

## 2018-01-27 ENCOUNTER — Encounter: Payer: Self-pay | Admitting: Gastroenterology

## 2018-01-27 NOTE — Telephone Encounter (Signed)
Okay thanks Julie 

## 2018-01-27 NOTE — Telephone Encounter (Signed)
Please see note from [place].

## 2018-01-27 NOTE — Telephone Encounter (Signed)
Spoke with patient states the fever like symptoms lasted a short time and no pain in abdomen. Pt does state some discomfort in her throat when swallowing liquids feels like it is closing up.

## 2018-01-27 NOTE — Telephone Encounter (Signed)
Patient has decided to move forward with colonoscopy. Patient has been scheduled for previsit and colonoscopy in [place].

## 2018-03-11 ENCOUNTER — Encounter: Payer: BLUE CROSS/BLUE SHIELD | Admitting: Gastroenterology

## 2018-04-16 ENCOUNTER — Ambulatory Visit (AMBULATORY_SURGERY_CENTER): Payer: Self-pay | Admitting: *Deleted

## 2018-04-16 VITALS — Ht 64.0 in | Wt 166.6 lb

## 2018-04-16 DIAGNOSIS — D509 Iron deficiency anemia, unspecified: Secondary | ICD-10-CM

## 2018-04-16 MED ORDER — NA SULFATE-K SULFATE-MG SULF 17.5-3.13-1.6 GM/177ML PO SOLN: 1.0000 | Freq: Once | ORAL | 0 refills | Status: AC

## 2018-04-16 NOTE — Progress Notes (Signed)
No egg or soy allergy known to patient - eggs upset stomach but not allergic- feels heavy in stomach  No issues with past sedation with any surgeries  or procedures, no intubation problems  No diet pills per patient No home 02 use per patient  No blood thinners per patient  Pt denies issues with constipation  No A fib or A flutter  EMMI video sent to pt's e mail - pt has no e [place] in [place] today with pt.  Pt given a Suprep $15 coupon in [place] today .

## 2018-04-23 ENCOUNTER — Encounter: Payer: Self-pay | Admitting: Gastroenterology

## 2018-04-30 ENCOUNTER — Other Ambulatory Visit: Payer: Self-pay

## 2018-04-30 ENCOUNTER — Ambulatory Visit (AMBULATORY_SURGERY_CENTER): Payer: BLUE CROSS/BLUE SHIELD | Admitting: Gastroenterology

## 2018-04-30 ENCOUNTER — Encounter: Payer: Self-pay | Admitting: Gastroenterology

## 2018-04-30 ENCOUNTER — Other Ambulatory Visit (INDEPENDENT_AMBULATORY_CARE_PROVIDER_SITE_OTHER): Payer: BLUE CROSS/BLUE SHIELD

## 2018-04-30 VITALS — BP 119/75 | HR 43 | Temp 98.4°F | Resp 18 | Ht 64.0 in | Wt 166.0 lb

## 2018-04-30 DIAGNOSIS — D509 Iron deficiency anemia, unspecified: Secondary | ICD-10-CM | POA: Diagnosis not present

## 2018-04-30 DIAGNOSIS — D125 Benign neoplasm of sigmoid colon: Secondary | ICD-10-CM

## 2018-04-30 DIAGNOSIS — Z1211 Encounter for screening for malignant neoplasm of colon: Secondary | ICD-10-CM | POA: Diagnosis not present

## 2018-04-30 LAB — HEMOGLOBIN: Hemoglobin: 13.3 g/dL (ref 12.0–15.0)

## 2018-04-30 MED ORDER — FERROUS SULFATE 325 (65 FE) MG PO TABS: 325.0000 mg | ORAL_TABLET | Freq: Two times a day (BID) | ORAL | 3 refills | Status: DC

## 2018-04-30 MED ORDER — SODIUM CHLORIDE 0.9 % IV SOLN: 500.0000 mL | INTRAVENOUS | Status: DC

## 2018-04-30 NOTE — Progress Notes (Signed)
To pacu VSS. Report to [email or web address] 

## 2018-04-30 NOTE — Progress Notes (Signed)
Patient to lab after discharge. °

## 2018-04-30 NOTE — Patient Instructions (Signed)
Discharge instructions given. Handout on polyps. Prescription sent to pharmacy. Resume previous medications. YOU HAD AN ENDOSCOPIC PROCEDURE TODAY AT THE [facility]:   Refer to the procedure report that was given to you for any specific questions about what was found during the examination.  If the procedure report does not answer your questions, please call your gastroenterologist to clarify.  If you requested that your care partner not be given the details of your procedure findings, then the procedure report has been included in a sealed envelope for you to review at your convenience later.  YOU SHOULD EXPECT: Some feelings of bloating in the abdomen. Passage of more gas than usual.  Walking can help get rid of the air that was put into your GI tract during the procedure and reduce the bloating. If you had a lower endoscopy (such as a colonoscopy or flexible sigmoidoscopy) you may notice spotting of blood in your stool or on the toilet paper. If you underwent a bowel prep for your procedure, you may not have a normal bowel movement for a few days.  Please Note:  You might notice some irritation and congestion in your nose or some drainage.  This is from the oxygen used during your procedure.  There is no need for concern and it should clear up in a day or so.  SYMPTOMS TO REPORT IMMEDIATELY:   Following lower endoscopy (colonoscopy or flexible sigmoidoscopy):  Excessive amounts of blood in the stool  Significant tenderness or worsening of abdominal pains  Swelling of the abdomen that is new, acute  Fever of 100F or higher   For urgent or emergent issues, a gastroenterologist can be reached at any hour by calling [phone].   DIET:  We do recommend a small meal at first, but then you may proceed to your regular diet.  Drink plenty of fluids but you should avoid alcoholic beverages for 24 hours.  ACTIVITY:  You should plan to take it easy for the rest of today and you  should NOT DRIVE or use heavy machinery until tomorrow (because of the sedation medicines used during the test).    FOLLOW UP: Our staff will call the number listed on your records the next business day following your procedure to check on you and address any questions or concerns that you may have regarding the information given to you following your procedure. If we do not reach you, we will leave a message.  However, if you are feeling well and you are not experiencing any problems, there is no need to return our call.  We will assume that you have returned to your regular daily activities without incident.  If any biopsies were taken you will be contacted by phone or by letter within the next 1-3 weeks.  Please call us at [phone] if you have not heard about the biopsies in 3 weeks.    SIGNATURES/CONFIDENTIALITY: You and/or your care partner have signed paperwork which will be entered into your electronic medical record.  These signatures attest to the fact that that the information above on your After Visit Summary has been reviewed and is understood.  Full responsibility of the confidentiality of this discharge information lies with you and/or your care-partner. 

## 2018-04-30 NOTE — Progress Notes (Signed)
Called to room to assist during endoscopic procedure.  Patient ID and intended procedure confirmed with present staff. Received instructions for my participation in the procedure from the performing physician.  

## 2018-04-30 NOTE — Progress Notes (Signed)
Pt's states no medical or surgical changes since previsit or office visit. 

## 2018-04-30 NOTE — Op Note (Signed)
[place] Patient Name: Kelli Hale Procedure Date: 04/30/2018 2:27 PM MRN: [id number] Endoscopist: Remo Lipps P. Havery Moros , MD Age: 47 Referring MD:  Date of Birth: 01-Feb-1971 Gender: Female Account #: [id number] Procedure:                Colonoscopy Indications:              Iron deficiency anemia, EGD negative Medicines:                Monitored Anesthesia Care Procedure:                Pre-Anesthesia Assessment:                           - Prior to the procedure, a History and Physical                            was performed, and patient medications and                            allergies were reviewed. The patient's tolerance of                            previous anesthesia was also reviewed. The risks                            and benefits of the procedure and the sedation                            options and risks were discussed with the patient.                            All questions were answered, and informed consent                            was obtained. Prior Anticoagulants: The patient has                            taken no previous anticoagulant or antiplatelet                            agents. ASA Grade Assessment: II - A patient with                            mild systemic disease. After reviewing the risks                            and benefits, the patient was deemed in                            satisfactory condition to undergo the procedure.                           After obtaining informed consent, the colonoscope  was passed under direct vision. Throughout the                            procedure, the patient's blood pressure, pulse, and                            oxygen saturations were monitored continuously. The                            Colonoscope was introduced through the anus and                            advanced to the the terminal ileum, with                            identification of  the appendiceal orifice and IC                            valve. The colonoscopy was performed without                            difficulty. The patient tolerated the procedure                            well. The quality of the bowel preparation was                            good. The terminal ileum, ileocecal valve,                            appendiceal orifice, and rectum were photographed. Scope In: 2:31:02 PM Scope Out: 2:49:34 PM Scope Withdrawal Time: 0 hours 15 minutes 45 seconds  Total Procedure Duration: 0 hours 18 minutes 32 seconds  Findings:                 The perianal and digital rectal examinations were                            normal.                           The terminal ileum appeared normal.                           A 4 mm polyp was found in the sigmoid colon. The                            polyp was sessile. The polyp was removed with a                            cold snare. Resection and retrieval were complete.                           The exam was otherwise without abnormality on  direct and retroflexion views. Complications:            No immediate complications. Estimated blood loss:                            Minimal. Estimated Blood Loss:     Estimated blood loss was minimal. Impression:               - The examined portion of the ileum was normal.                           - One 4 mm polyp in the sigmoid colon, removed with                            a cold snare. Resected and retrieved.                           - The examination was otherwise normal on direct                            and retroflexion views.                           No cause for iron deficiency anemia, which may                            otherwise be due to menstrual blood loss. Recommendation:           - Patient has a contact number available for                            emergencies. The signs and symptoms of potential                             delayed complications were discussed with the                            patient. Return to normal activities tomorrow.                            Written discharge instructions were provided to the                            patient.                           - Resume previous diet.                           - Continue present medications.                           - Resume ferrous sulfate 325mg  BID (patient had ran                            out)                           -  Repeat Hgb to ensure stable                           - Consultation to Gynecology for menorrhagia                           - Await pathology results.                           - Repeat colonoscopy for surveillance based on                            pathology results. Remo Lipps P. Havery Moros, MD 04/30/2018 2:54:28 PM This report has been signed electronically.

## 2018-05-01 ENCOUNTER — Other Ambulatory Visit: Payer: Self-pay

## 2018-05-01 ENCOUNTER — Telehealth: Payer: Self-pay

## 2018-05-01 DIAGNOSIS — N924 Excessive bleeding in the premenopausal period: Secondary | ICD-10-CM

## 2018-05-01 NOTE — Telephone Encounter (Signed)
  Follow up Call-  Call back number 04/30/2018 01/16/2018  Post procedure Call Back phone  # [phone]- her daughter Lenn Sink number  Permission to leave phone message Yes Yes  Some recent data might be hidden     Patient questions:  Do you have a fever, pain , or abdominal swelling? No. Pain Score  0 *  Have you tolerated food without any problems? Yes.    Have you been able to return to your normal activities? Yes.    Do you have any questions about your discharge instructions: Diet   No. Medications  No. Follow up visit  No.  Do you have questions or concerns about your Care? No.  Actions: * If pain score is 4 or above: No action needed, pain <4.

## 2018-05-04 ENCOUNTER — Telehealth: Payer: Self-pay | Admitting: Obstetrics and Gynecology

## 2018-05-04 NOTE — Telephone Encounter (Signed)
Called with an interpreter and left a message for patient to call back to schedule a new patient doctor referral appointment with our office to see any provider for: Menorrhagia.

## 2018-05-08 ENCOUNTER — Encounter: Payer: Self-pay | Admitting: Gastroenterology

## 2018-05-27 ENCOUNTER — Other Ambulatory Visit: Payer: Self-pay | Admitting: Obstetrics and Gynecology

## 2018-05-27 ENCOUNTER — Ambulatory Visit (INDEPENDENT_AMBULATORY_CARE_PROVIDER_SITE_OTHER): Payer: BLUE CROSS/BLUE SHIELD | Admitting: Obstetrics and Gynecology

## 2018-05-27 ENCOUNTER — Other Ambulatory Visit: Payer: Self-pay

## 2018-05-27 ENCOUNTER — Encounter: Payer: Self-pay | Admitting: Obstetrics and Gynecology

## 2018-05-27 ENCOUNTER — Other Ambulatory Visit (HOSPITAL_COMMUNITY)
Admission: RE | Admit: 2018-05-27 | Discharge: 2018-05-27 | Disposition: A | Discharge status: Home / Self Care | Payer: BLUE CROSS/BLUE SHIELD | Source: Ambulatory Visit | Attending: Obstetrics and Gynecology | Admitting: Obstetrics and Gynecology

## 2018-05-27 VITALS — BP 118/73 | HR 51 | Ht 64.5 in | Wt 163.0 lb

## 2018-05-27 DIAGNOSIS — N939 Abnormal uterine and vaginal bleeding, unspecified: Secondary | ICD-10-CM

## 2018-05-27 DIAGNOSIS — Z01419 Encounter for gynecological examination (general) (routine) without abnormal findings: Secondary | ICD-10-CM | POA: Diagnosis not present

## 2018-05-27 DIAGNOSIS — Z1231 Encounter for screening mammogram for malignant neoplasm of breast: Secondary | ICD-10-CM

## 2018-05-27 NOTE — Progress Notes (Signed)
Patient scheduled while in office for PUS/EMB and MMG with help of interpreter, Alba. Patient scheduled for PUS/EMB on 06-04-18 at 0800. Patient agreeable to date and time of appointment. Patient scheduled for 3D MMG at [facility] on 06-23-18 at 1500. Patient aware to contact [employer] to see if plan with cover Gypsy charge, aware there will be out of pocket expense if they do not. Patient states she thinks she will do 3D, but aware to call the [facility] prior to her appointment if she changes her mind.

## 2018-05-27 NOTE — Progress Notes (Signed)
47 y.o. [id number] Married female here for new patient annual exam.    Spanish interpretor present for the visit today.   Menstruation every month, about every 20 - 25 days for 3 - 4 months.  Last for 8 days.  Has clots. Changes pad every 15 minutes at the worst.  Back pain and cramping with cycles.   Pain with intercourse.   No pain outside of cycle or if not having intercourse.   Hx anemia.  Taking iron daily.   Normal endoscopy and colonoscopy.  Found polyp.   Has pain in back when sitting for a long time.   From [place].  Living here for 20 years.   PCP:   [place]  Patient's last menstrual period was 05/21/2018.     Period Cycle (Days): 14 Period Duration (Days): 5-7 Period Pattern: (!) Irregular Menstrual Flow: Heavy Menstrual Control: Maxi pad Dysmenorrhea: (!) Moderate     Sexually active: Yes.    The current method of family planning is tubal ligation.    Exercising: yes walk daily Smoker:  no  Health Maintenance: Pap:  2018 normal per patient.  Did through [employer].  History of abnormal Pap:  no MMG:  n/a Colonoscopy:  n/a BMD:   n/a  Result  n/a TDaP:  12/2017 Gardasil:   no HIV: Hep C: Screening Labs:   ---   reports that she has never smoked. She has never used smokeless tobacco. She reports that she does not drink alcohol or use drugs.  Past Medical History:  Diagnosis Date  . Allergy   . Anemia   . Constipation    occasionally- but daily or weekly   . Thyroid disease 2005    Past Surgical History:  Procedure Laterality Date  . CESAREAN SECTION    . COLPOSCOPY    . TUBAL LIGATION    . UPPER GASTROINTESTINAL ENDOSCOPY  01/16/2018    Current Outpatient Medications  Medication Sig Dispense Refill  . ferrous sulfate (FERROUSUL) 325 (65 FE) MG tablet Take 1 tablet (325 mg total) by mouth 2 (two) times daily with a meal. 30 tablet 3  . Multiple Vitamin (MULTIVITAMIN) tablet Take 1 tablet by mouth daily.     Current  Facility-Administered Medications  Medication Dose Route Frequency Provider Last Rate Last Dose  . 0.9 %  sodium chloride infusion  500 mL Intravenous Once Armbruster, [name], MD        Family History  Problem Relation Age of Onset  . Heart disease Mother   . Rectal cancer Neg Hx   . Stomach cancer Neg Hx   . Colon cancer Neg Hx   . Esophageal cancer Neg Hx   . Colon polyps Neg Hx     Review of Systems  Constitutional: Negative.   HENT: Negative.   Eyes: Negative.   Respiratory: Negative.   Cardiovascular: Negative.   Gastrointestinal: Negative.   Endocrine: Negative.   Genitourinary: Positive for vaginal bleeding.  Musculoskeletal: Negative.   Skin: Negative.   Allergic/Immunologic: Negative.   Neurological: Negative.   Hematological: Negative.   Psychiatric/Behavioral: Negative.   All other systems reviewed and are negative.   Exam:   BP 118/73   Pulse (!) 51   Ht 5' 4.5" (1.638 m)   Wt 163 lb (73.9 kg)   LMP 05/21/2018   BMI 27.55 kg/m     General appearance: alert, cooperative and appears stated age Head: Normocephalic, without obvious abnormality, atraumatic Neck: no adenopathy, supple, symmetrical, trachea midline and  thyroid normal to inspection and palpation Lungs: clear to auscultation bilaterally Breasts: normal appearance, no masses or tenderness, No nipple retraction or dimpling, No nipple discharge or bleeding, No axillary or supraclavicular adenopathy Heart: regular rate and rhythm Abdomen: soft, non-tender; no masses, no organomegaly Extremities: extremities normal, atraumatic, no cyanosis or edema Skin: Skin color, texture, turgor normal. No rashes or lesions Lymph nodes: Cervical, supraclavicular, and axillary nodes normal. No abnormal inguinal nodes palpated Neurologic: Grossly normal  Pelvic: External genitalia:  no lesions              Urethra:  normal appearing urethra with no masses, tenderness or lesions              Bartholins and  Skenes: normal                 Vagina: normal appearing vagina with normal color and discharge, no lesions              Cervix: no lesions.  Mild menstrual bleeding noted.               Pap taken: Yes.   Bimanual Exam:  Uterus:  7 - 8 week size and globular.               Adnexa: no mass, fullness, tenderness              Rectal exam: Yes.  .  Confirms.              Anus:  normal sphincter tone, no lesions  Chaperone was present for exam.  Assessment:   Well woman visit with normal exam. Status post BTL.  Hx anemia.  Menorrhagia with irregular menses.  I suspect fibroids.   Plan: Mammogram screening.  Will assist with scheduling.  Recommended self breast awareness. Pap and HR HPV as above. Guidelines for Calcium, Vitamin D, regular exercise program including cardiovascular and weight bearing exercise. TSH, CBC. Return for pelvic US and possible EMB.  Discussed with patient and written information given to the patient in Spanish.  Follow up annually and prn.    After visit summary provided.

## 2018-05-27 NOTE — Patient Instructions (Signed)
EXERCISE AND DIET:  We recommended that you start or continue a regular exercise program for good health. Regular exercise means any activity that makes your heart beat faster and makes you sweat.  We recommend exercising at least 30 minutes per day at least 3 days a week, preferably 4 or 5.  We also recommend a diet low in fat and sugar.  Inactivity, poor dietary choices and obesity can cause diabetes, heart attack, stroke, and kidney damage, among others.    ALCOHOL AND SMOKING:  Women should limit their alcohol intake to no more than 7 drinks/beers/glasses of wine (combined, not each!) per week. Moderation of alcohol intake to this level decreases your risk of breast cancer and liver damage. And of course, no recreational drugs are part of a healthy lifestyle.  And absolutely no smoking or even second hand smoke. Most people know smoking can cause heart and lung diseases, but did you know it also contributes to weakening of your bones? Aging of your skin?  Yellowing of your teeth and nails?  CALCIUM AND VITAMIN D:  Adequate intake of calcium and Vitamin D are recommended.  The recommendations for exact amounts of these supplements seem to change often, but generally speaking 600 mg of calcium (either carbonate or citrate) and 800 units of Vitamin D per day seems prudent. Certain women may benefit from higher intake of Vitamin D.  If you are among these women, your doctor will have told you during your visit.    PAP SMEARS:  Pap smears, to check for cervical cancer or precancers,  have traditionally been done yearly, although recent scientific advances have shown that most women can have pap smears less often.  However, every woman still should have a physical exam from her gynecologist every year. It will include a breast check, inspection of the vulva and vagina to check for abnormal growths or skin changes, a visual exam of the cervix, and then an exam to evaluate the size and shape of the uterus and  ovaries.  And after 47 years of age, a rectal exam is indicated to check for rectal cancers. We will also provide age appropriate advice regarding health maintenance, like when you should have certain vaccines, screening for sexually transmitted diseases, bone density testing, colonoscopy, mammograms, etc.   MAMMOGRAMS:  All women over 40 years old should have a yearly mammogram. Many facilities now offer a "3D" mammogram, which may cost around $50 extra out of pocket. If possible,  we recommend you accept the option to have the 3D mammogram performed.  It both reduces the number of women who will be called back for extra views which then turn out to be normal, and it is better than the routine mammogram at detecting truly abnormal areas.    COLONOSCOPY:  Colonoscopy to screen for colon cancer is recommended for all women at age 50.  We know, you hate the idea of the prep.  We agree, BUT, having colon cancer and not knowing it is worse!!  Colon cancer so often starts as a polyp that can be seen and removed at colonscopy, which can quite literally save your life!  And if your first colonoscopy is normal and you have no family history of colon cancer, most women don't have to have it again for 10 years.  Once every ten years, you can do something that may end up saving your life, right?  We will be happy to help you get it scheduled when you are ready.    Be sure to check your insurance coverage so you understand how much it will cost.  It may be covered as a preventative service at no cost, but you should check your particular policy.      Biopsia de endometrio (Endometrial Biopsy) La biopsia de endometrio es un procedimiento en el que se toma una Sentinel de tejido del tero. Luego la muestra de tejido se observa en el microscopio para ver si el tejido es normal o anormal. El endometrio es el revestimiento interno del tero. Este procedimiento ayuda a determinar si est en el ciclo menstrual y de que modo los  niveles de hormonas afectan el revestimiento del tero. Este procedimiento tambin se [place] para evaluar el sangrado uterino o para Electrical engineer de endometrio, tuberculosis, plipos o enfermedades inflamatorias. INFORME A SU MDICO:  Cualquier alergia que tenga.  Todos los [employer] [place], incluyendo vitaminas, hierbas, gotas oftlmicas, cremas y medicamentos de venta libre.  Problemas previos que usted o los [employer] de su familia hayan tenido con el uso de anestsicos.  Enfermedades de [employer].  Cirugas previas.  Padecimientos mdicos.  Posible embarazo.  RIESGOS Y COMPLICACIONES Generalmente es un procedimiento seguro. Sin embargo, Games developer procedimiento, pueden surgir complicaciones. Las complicaciones posibles son:  Hemorragias.  Infecciones plvicas  Lesin en la pared del tero con el instrumento utilizado para tomar la biopsia (raro). ANTES DEL PROCEDIMIENTO  Lleve un registro de sus ciclos menstruales segn las indicaciones de su mdico. Puede ser necesario que programe el procedimiento para un momento especfico del ciclo menstrual.  Tendr que llevar un apsito sanitario para usar despus del procedimiento.  Pdale a alguna persona que la lleve a su casa despus del procedimiento si le dan un medicamento para relajarse (sedante).  PROCEDIMIENTO  Le podrn administrar un medicamento para relajarse.  Deber recostarse en una camilla con los pies y las piernas elevados, como en el examen plvico.  El mdico insertar un instrumento (espculo) en la vagina para observar el cuello del tero.  El cuello del tero ser desinfectado con una solucin antisptica. Para adormecer el cuello del tero le aplicarn un medicamento (anestsico local).  Se utilizar un frceps (tenculo) para [employer].  Se insertar un instrumento delgado, similar a una varilla (sonda uterina) a travs del cuello del tero para Teacher, adult education su longitud  y la ubicacin en la que ser tomada la muestra para la biopsia.  Luego se pasa un tubo delgado y flexible (catter) a travs del cuello del tero hasta el tero. El catter se [place] para Barista de tejido del endometrio para la biopsia.  El catter y el espculo se retirarn y la [place] se enviar al laboratorio para ser examinada.  DESPUS DEL PROCEDIMIENTO  Descansar en una sala de recuperacin hasta que est lista para volver a su casa.  Sentir clicos leves y tendr una pequea cantidad de sangrado vaginal durante algunos das despus del procedimiento. Esto es normal.  Asegrese de [employer].  Esta informacin no tiene Marine scientist el consejo del mdico. Asegrese de hacerle al mdico cualquier pregunta que tenga. Document Released: 04/28/2013 Document Revised: 08/31/2013 Document Reviewed: 02/10/2013 Elsevier Interactive Patient Education  2017 Onycha transvaginal (Transvaginal Ultrasound) La ecografa transvaginal, tambin llamada ecografa endovaginal, es un estudio que utiliza ondas sonoras inofensivas para tomar imgenes del aparato genital femenino. Las imgenes se toman con un dispositivo llamado transductor, que se coloca en la vagina. Este estudio puede realizarse para:  Detectar problemas con el embarazo.  Examinar al beb en desarrollo.  Detectar cualquier anomala en el tero o los ovarios.  Evaluar el dolor plvico o hemorragia. INFORME A SU MDICO:  Cualquier alergia que tenga.  Todos los [employer], incluidos vitaminas, hierbas, gotas oftlmicas, cremas y medicamentos de venta libre.  Enfermedades de la sangre que tenga.  Si tiene cirugas previas.  Cualquier enfermedad que tenga.  Si est embarazada o podra estarlo.  Si est menstruando. RIESGOS Y COMPLICACIONES La ecografa no representa complicaciones ni riesgos conocidos. ANTES DEL PROCEDIMIENTO  Este estudio se debe  realizar cuando su vejiga est vaca. Siga las indicaciones de su mdico sobre la ingesta de lquidos y vaciar su vejiga antes del estudio. PROCEDIMIENTO  Deber vaciar la vejiga.  Se quitar la ropa de la cintura para abajo.  Deber acostarse en una camilla, con las rodillas dobladas y los pies en los estribos.  Un mdico cubrir el transductor con un condn estril.  Aplicar gel en el transductor. El gel ayuda a transmitir las ondas sonoras y Fish farm manager la irritacin de la vagina.  El Diplomatic Services operational officer en la vagina para obtener las imgenes. Estas imgenes se vern en un monitor similar a una pequea pantalla de televisin.  El tcnico retirar el transductor cuando termine el procedimiento. DESPUS DEL PROCEDIMIENTO  Es su responsabilidad retirar el resultado del [place]. Consulte a su mdico o en el departamento donde se realice el procedimiento cundo estarn [employer].  Concurra a todas las visitas de control como se lo haya indicado el mdico. Esto es importante. Esta informacin no tiene Marine scientist el consejo del mdico. Asegrese de hacerle al mdico cualquier pregunta que tenga. Document Released: 12/12/2008 Document Revised: 12/18/2015 Document Reviewed: 01/25/2015 Elsevier Interactive Patient Education  [employer].

## 2018-05-28 LAB — CBC
Hematocrit: 39.9 % (ref 34.0–46.6)
Hemoglobin: 12.9 g/dL (ref 11.1–15.9)
MCH: 27.6 pg (ref 26.6–33.0)
MCHC: 32.3 g/dL (ref 31.5–35.7)
MCV: 85 fL (ref 79–97)
PLATELETS: 283 10*3/uL (ref 150–450)
RBC: 4.68 x10E6/uL (ref 3.77–5.28)
RDW: 14.1 % (ref 12.3–15.4)
WBC: 5.4 10*3/uL (ref 3.4–10.8)

## 2018-05-28 LAB — TSH: TSH: 36.12 u[IU]/mL — ABNORMAL HIGH (ref 0.450–4.500)

## 2018-05-29 LAB — CYTOLOGY - PAP
DIAGNOSIS: NEGATIVE
HPV: NOT DETECTED

## 2018-06-04 ENCOUNTER — Encounter: Payer: Self-pay | Admitting: Obstetrics and Gynecology

## 2018-06-04 ENCOUNTER — Other Ambulatory Visit: Payer: BLUE CROSS/BLUE SHIELD

## 2018-06-04 ENCOUNTER — Ambulatory Visit (INDEPENDENT_AMBULATORY_CARE_PROVIDER_SITE_OTHER): Payer: BLUE CROSS/BLUE SHIELD | Admitting: Obstetrics and Gynecology

## 2018-06-04 ENCOUNTER — Ambulatory Visit (INDEPENDENT_AMBULATORY_CARE_PROVIDER_SITE_OTHER): Payer: BLUE CROSS/BLUE SHIELD

## 2018-06-04 VITALS — BP 110/62 | HR 64 | Resp 16 | Ht 64.5 in | Wt 163.0 lb

## 2018-06-04 DIAGNOSIS — D219 Benign neoplasm of connective and other soft tissue, unspecified: Secondary | ICD-10-CM

## 2018-06-04 DIAGNOSIS — N939 Abnormal uterine and vaginal bleeding, unspecified: Secondary | ICD-10-CM

## 2018-06-04 DIAGNOSIS — N9489 Other specified conditions associated with female genital organs and menstrual cycle: Secondary | ICD-10-CM | POA: Diagnosis not present

## 2018-06-04 NOTE — Patient Instructions (Signed)
Dilatacin y curetaje o curetaje por aspiracin (Dilation and Curettage or Vacuum Curettage) La dilatacin y el curetaje por aspiracin son procedimientos menores. Consiste en la apertura (dilatacin) del cuello del tero y el raspado (curetaje) en la superficie interna del tero. Durante este procedimiento, el tejido del interior del tero se raspa suavemente. Durante el curetaje por aspiracin, se utiliza una succin suave para extirpar tejidos del tero.  El curetaje podr realizarse para diagnstico o para tratar un problema. Como procedimiento diagnstico, se realiza para examinar los tejidos del tero. Un diagnstico con curetaje se realiza en caso de presentarse los siguientes sntomas:   Sangrado irregular en el tero.  Hemorragias con cogulos.  Prdida de Microsoft perodos [place].  Perodos menstruales prolongados.  Sangrado luego de la menopausia.  Falta de periodo menstrual (amenorrea).  Cambio en el tamao y forma del tero. Entergy Corporation, el curetaje podr realizarse por las siguientes causas:   Remocin del DIU (dispositivo intrauterino).  Remocin de placenta retenida luego del parto. La placenta retenida puede causar una hemorragia lo suficientemente grave como para requerir transfusiones o Astronomer una infeccin.  Aborto.  Aborto espontneo.  Extirpacin de plipos en el interior del tero.  Extirpacin de fibromas no frecuentes (bultos no cancerosos). INFORME A SU MDICO:   Cualquier alergia que tenga.  Todos los UAL Corporation [place], incluyendo vitaminas, hierbas, gotas oftlmicas, cremas y medicamentos de venta libre.  Problemas previos que usted o los [facility] de su familia hayan tenido con el uso de anestsicos.  Enfermedades de Campbell Soup.  Cirugas previas.  Padecimientos mdicos. RIESGOS Y COMPLICACIONES  Generalmente es un procedimiento seguro. Sin embargo, Games developer procedimiento, pueden surgir complicaciones.  Las complicaciones posibles son:  Elvina Mattes.  Infeccin en el tero.  Lesin en el cuello del tero.  Desarrollo de tejido Pensions consultant (adherencias) dentro del tero que posteriormente causan hemorragia menstrual en cantidad anormal.  Complicaciones de la anestesia general, si se ha usado.  Perforacin del tero. Esto es raro. ANTES DEL PROCEDIMIENTO   Coma y beba antes del procedimiento slo lo que le indique el mdico.  Arregle con alguna persona para que la lleve a su casa. PROCEDIMIENTO  El procedimiento demora entre 15 y 26 minutos.  Podrn administrarle uno de los siguientes medicamentos: ? Medicamentos que adormecen el rea del cuello uterino (anestesia local). ? Un medicamento para que duerma durante el procedimiento (anestesia general).  Deber recostarse sobre la espalda con las piernas en los estribos.  Le colocarn en la vagina un instrumento metlico o plstico entibiado espculo) para [place] y permitir al profesional visualizar el cuello del tero.  Reliant Energy formas en las que el cuello del tero puede ser ablandado y dilatado. Pueden ser: ? Tomar medicamentos. ? Insercin de unas varillas delgadas (laminarias) en el cuello del tero.  Se utilizar un instrumento curvo (cureta)para raspar las clulas de la membrana que cubre el interior del tero. En algunos casos, se aplica una succin suave con la cureta. Luego la cureta se Charity fundraiser. DESPUS DEL PROCEDIMIENTO   Descansar en una sala de recuperacin hasta que se sienta estable y lista para volver a su casa.  Podr tener nuseas o vmitos si le han administrado anestesia general.  Education officer, environmental de garganta si le han colocado un tubo durante la anestesia general.  Podr sentir algunos clicos y Best boy una pequea hemorragia. Estas molestias pueden durar entre 2 das y 2 semanas despus del procedimiento.  Luego del procedimiento, el tero formar [employer]  nueva cubierta. Esto puede hacer que el  prximo perodo se retrase. Esta informacin no tiene Marine scientist el consejo del mdico. Asegrese de hacerle al mdico cualquier pregunta que tenga. Document Released: 02/12/2008 Document Revised: 04/28/2013 Elsevier Interactive Patient Education  2017 [place] (Hysteroscopy) La histeroscopa es un procedimiento que se utiliza para observar el interior de la matriz (tero) Puede indicarse por diferentes motivos, entre ellos:  Para evaluar una hemorragia anormal, un fibroma (tumor benigno, no canceroso), tumores, plipos o tejido cicatrizal (adherencias) y la posibilidad de cncer de tero.  Para detectar bultos (tumores) y otros crecimientos anormales uterinos.  Para buscar las causas por las que una mujer no queda embarazada (infertilidad) causas recurrentes de prdida de embarazo (abortos espontneos) o por la prdida de un dispositivo intrauterino (DIU).  Para realizar un procedimiento de esterilizacin cerrando las trompas de Falopio desde adentro del tero. En este procedimiento, se coloca un tubo delgado y luminoso, con una cmara en el extremo (histeroscopio)para observar el interior del tero. La histeroscopa se realiza inmediatamente despus del perodo menstrual para asegurarse de que no existe embarazo. INFORME A SU MDICO:  Cualquier alergia que tenga.  Todos los UAL Corporation [place], incluyendo vitaminas, hierbas, gotas oftlmicas, cremas y medicamentos de venta libre.  Problemas previos que usted o los [facility] de su familia hayan tenido con el uso de anestsicos.  Enfermedades de Campbell Soup.  Cirugas previas.  Padecimientos mdicos.  RIESGOS Y COMPLICACIONES Generalmente es un procedimiento seguro. Sin embargo, Games developer procedimiento, pueden surgir complicaciones. Las complicaciones posibles son:  Perforacin del tero.  Sangrado excesivo.  Infeccin.  Lesin en el cuello del tero.  Lesiones en otros  rganos.  Reaccin alrgica a un medicamento.  Inoculacin de lquido en exceso dentro del tero. ANTES DEL PROCEDIMIENTO  Consulte a su mdico si debe cambiar o suspender los medicamentos que toma habitualmente.  No tome aspirina ni anticoagulantes durante la semana previa al procedimiento, o segn le hayan indicado. Pueden ocasionar hemorragias.  Si fuma, no lo haga durante las AT[employer]T al procedimiento.  En algunos casos, el da anterior al procedimiento se coloca un medicamento en el cuello del tero. Este medicamento dilata el cuello y [place] la abertura. Esto facilita la insercin del instrumento en el tero durante el procedimiento.  No debe comer ni beber nada durante al menos 8 horas antes de la [place].  Pdale a alguna persona que la lleve a su casa luego del procedimiento.  PROCEDIMIENTO  Le administrarn un medicamento para relajarse (sedante). Tambin podrn administrarle uno de los siguientes medicamentos: ? Medicamentos que adormecen el rea del cuello uterino (anestesia local). ? Un medicamento para que duerma durante el procedimiento (anestesia genera).  El histeroscopio se inserta a travs de la vagina, dentro del tero. La cmara del laparoscopio enva una imagen a una pantalla de televisin que se encuentra en el quirfano. [employer] modo, el mdico tendr Ardelia Mems buena visin del interior del tero.  Durante el Wellsite geologist un lquido o aire en el interior de [place], lo que le permitir al cirujano observar mejor.  En algunas ocasiones, el tejido del interior del tero se legra suavemente. Esas muestras de tejido se envan al laboratorio para ser examinadas.  DESPUS DEL PROCEDIMIENTO  Si le han administrado anestesia general podr sentirse atontada durante algunas horas despus del procedimiento.  Si se utiliza Tour manager, podr volver a su casa tan pronto como est estable y se sienta en condiciones.  Podr  sentir clicos  leves. Es normal que esto dure un par [employer].  Podr tener una hemorragia, la que puede variar entre una pequea mancha durante algunos das hasta una hemorragia similar a Mining engineer durante 3-7 das. Esto es normal.  Si el resultado de los estudios no estn listos durante la visita, tenga otra entrevista con su mdico para conocerlos.  Esta informacin no tiene Marine scientist el consejo del mdico. Asegrese de hacerle al mdico cualquier pregunta que tenga. Document Released: 08/26/2005 Document Revised: 06/16/2013 Document Reviewed: 03/25/2013 Elsevier Interactive Patient Education  2017 [employer].

## 2018-06-04 NOTE — Progress Notes (Signed)
GYNECOLOGY  VISIT   HPI: 47 y.o.   Married  Hispanic  female   [phone] with Patient's last menstrual period was 05/21/2018.   here for   Ultrasound.  Interpretor present for this visit.   Heavy menses and dysmenorrhea.   She does not have a lot of pain.  Her lower back pain has improved since she had her pap smear.  The pain only occurs once and a while.   GYNECOLOGIC HISTORY: Patient's last menstrual period was 05/21/2018. Contraception:  Tubal ligation  Menopausal hormone therapy: none Last mammogram:  None  Last pap smear:   05/27/18 Neg; Neg HR HPV        OB History    Gravida  3   Para  2   Term  2   Preterm  0   AB  0   Living  3     SAB  0   TAB  0   Ectopic  0   Multiple  0   Live Births  3              Patient Active Problem List   Diagnosis Date Noted  . Upper abdominal pain 01/02/2018  . Lower urinary tract symptoms (LUTS) 06/28/2017  . Acute UTI 06/28/2017    Past Medical History:  Diagnosis Date  . Allergy   . Anemia   . Constipation    occasionally- but daily or weekly   . Thyroid disease 2005    Past Surgical History:  Procedure Laterality Date  . CESAREAN SECTION    . COLPOSCOPY    . TUBAL LIGATION    . UPPER GASTROINTESTINAL ENDOSCOPY  01/16/2018    Current Outpatient Medications  Medication Sig Dispense Refill  . ferrous sulfate (FERROUSUL) 325 (65 FE) MG tablet Take 1 tablet (325 mg total) by mouth 2 (two) times daily with a meal. 30 tablet 3  . Multiple Vitamin (MULTIVITAMIN) tablet Take 1 tablet by mouth daily.     Current Facility-Administered Medications  Medication Dose Route Frequency Provider Last Rate Last Dose  . 0.9 %  sodium chloride infusion  500 mL Intravenous Once Armbruster, [name], MD         ALLERGIES: Patient has no known allergies.  Family History  Problem Relation Age of Onset  . Heart disease Mother   . Rectal cancer Neg Hx   . Stomach cancer Neg Hx   . Colon cancer Neg Hx   .  Esophageal cancer Neg Hx   . Colon polyps Neg Hx     Social History   Socioeconomic History  . Marital status: Married    Spouse name: Not on file  . Number of children: 3  . Years of education: Not on file  . Highest education level: Not on file  Occupational History  . Not on file  Social Needs  . Financial resource strain: Not on file  . Food insecurity:    Worry: Not on file    Inability: Not on file  . Transportation needs:    Medical: Not on file    Non-medical: Not on file  Tobacco Use  . Smoking status: Never Smoker  . Smokeless tobacco: Never Used  Substance and Sexual Activity  . Alcohol use: No  . Drug use: No  . Sexual activity: Yes    Birth control/protection: Surgical  Lifestyle  . Physical activity:    Days per week: Not on file    Minutes per session: Not on  file  . Stress: Not on file  Relationships  . Social connections:    Talks on phone: Not on file    Gets together: Not on file    Attends religious service: Not on file    Active member of club or organization: Not on file    Attends meetings of clubs or organizations: Not on file    Relationship status: Not on file  . Intimate partner violence:    Fear of current or ex partner: Not on file    Emotionally abused: Not on file    Physically abused: Not on file    Forced sexual activity: Not on file  Other Topics Concern  . Not on file  Social History Narrative  . Not on file    Review of Systems  Constitutional: Positive for chills.       Weight loss   Eyes:       Head cold   Respiratory:       Sinusitis     PHYSICAL EXAMINATION:    BP 110/62   Pulse 64   Resp 16   Ht 5' 4.5" (1.638 m)   Wt 163 lb (73.9 kg)   LMP 05/21/2018   BMI 27.55 kg/m     General appearance: alert, cooperative and appears stated age  Pelvic US Posterior right fibroid - 3.6 x 2.9 cm with degeneration.  Echogenic mass of the endometrium 24 x 10 mm with feeder vessel.  2.3 cm right ovarian follicle.    No free fluid.  ASSESSMENT  Status post BTL.  Hx anemia.  Menorrhagia with irregular menses.    Fibroid with degeneration.  Minimal pelvic pain.  Thickened endometrium with feeder vessel.   PLAN  I discussed the fibroid and echogenic thickening suspected to be a polyp.  I am recommending a hysteroscopy with dilation and curettage with polypectomy.  Hysterectomy was briefly discussed but is not needed at this time.  Written information on hysteroscopy with dilation and curettage in Spanish to patient. Patient will return for preop visit.    An After Visit Summary was printed and given to the patient.  _15_____ minutes face to face time of which over 50% was spent in counseling.

## 2018-06-05 ENCOUNTER — Telehealth: Payer: Self-pay | Admitting: *Deleted

## 2018-06-05 DIAGNOSIS — R7989 Other specified abnormal findings of blood chemistry: Secondary | ICD-10-CM

## 2018-06-05 NOTE — Telephone Encounter (Signed)
Notes recorded by Burnice Logan, RN on 06/05/2018 at 9:47 AM EDT Call placed using [place], [place] #[id number], Left message to call Sharee Pimple, RN at [phone].   [phone] recall placed

## 2018-06-05 NOTE — Telephone Encounter (Signed)
Spoke with patient using [place] ID [phone]. Advised as seen below per Dr. Quincy Simmonds. Lab appt scheduled for 10/1 at 2:30pm. Patient verbalizes understanding and is agreeable.   Future lab orders placed.   Encounter closed.

## 2018-06-05 NOTE — Telephone Encounter (Signed)
-----   Message from Nunzio Cobbs, MD sent at 06/05/2018  5:41 AM EDT ----- Please contact patient about her test results.  You will need to check her DPI or use a Spanish interpretor.  She was at the office yesterday, and I did not review this with her at that time.  She has an elevated TSH and may be developing hypothyroidism.  This needs to be rechecked along with a free T4 level to confirm the diagnosis.  She needs to have a hysteroscopy with polyp removal, and we need to determine her thyroid status prior to surgery. She may need to start taking thyroid hormone. Please schedule this lab visit for next week.   Her blood counts were normal.   Her pap is normal and her HR HPV is negative.  Pap recall - 02.

## 2018-06-09 ENCOUNTER — Other Ambulatory Visit (INDEPENDENT_AMBULATORY_CARE_PROVIDER_SITE_OTHER): Payer: BLUE CROSS/BLUE SHIELD

## 2018-06-09 DIAGNOSIS — R7989 Other specified abnormal findings of blood chemistry: Secondary | ICD-10-CM

## 2018-06-10 LAB — T4, FREE: FREE T4: 0.44 ng/dL — AB (ref 0.82–1.77)

## 2018-06-10 LAB — TSH: TSH: 39.87 u[IU]/mL — ABNORMAL HIGH (ref 0.450–4.500)

## 2018-06-11 ENCOUNTER — Encounter: Payer: Self-pay | Admitting: Obstetrics and Gynecology

## 2018-06-23 ENCOUNTER — Ambulatory Visit
Admission: RE | Admit: 2018-06-23 | Discharge: 2018-06-23 | Disposition: A | Discharge status: Home / Self Care | Payer: BLUE CROSS/BLUE SHIELD | Source: Ambulatory Visit | Attending: Obstetrics and Gynecology | Admitting: Obstetrics and Gynecology

## 2018-06-23 DIAGNOSIS — Z1231 Encounter for screening mammogram for malignant neoplasm of breast: Secondary | ICD-10-CM

## 2018-06-25 ENCOUNTER — Ambulatory Visit (INDEPENDENT_AMBULATORY_CARE_PROVIDER_SITE_OTHER): Payer: BLUE CROSS/BLUE SHIELD | Admitting: Obstetrics and Gynecology

## 2018-06-25 ENCOUNTER — Encounter: Payer: Self-pay | Admitting: Obstetrics and Gynecology

## 2018-06-25 ENCOUNTER — Other Ambulatory Visit: Payer: Self-pay

## 2018-06-25 VITALS — BP 110/62 | HR 60 | Ht 64.5 in | Wt 163.0 lb

## 2018-06-25 DIAGNOSIS — N9489 Other specified conditions associated with female genital organs and menstrual cycle: Secondary | ICD-10-CM | POA: Diagnosis not present

## 2018-06-25 DIAGNOSIS — N760 Acute vaginitis: Secondary | ICD-10-CM | POA: Diagnosis not present

## 2018-06-25 DIAGNOSIS — E039 Hypothyroidism, unspecified: Secondary | ICD-10-CM

## 2018-06-25 DIAGNOSIS — N946 Dysmenorrhea, unspecified: Secondary | ICD-10-CM | POA: Diagnosis not present

## 2018-06-25 MED ORDER — LEVOTHYROXINE SODIUM 50 MCG PO TABS: 50.0000 ug | ORAL_TABLET | Freq: Every day | ORAL | 11 refills | Status: DC

## 2018-06-25 NOTE — Patient Instructions (Signed)
Levothyroxine tablets Qu es este medicamento? La LEVOTIROXINA es una hormona tiroidea. Este medicamento puede Un[employer]ys Corporation sntomas de deficiencia tiroidea, tales como hablar con lentitud, falta de energa, aumento de [place], cada del cabello, piel seca y sensibilidad inusual al fro. Tambin sirve para tratar una enfermedad llamada bocio (la dilatacin de la glndula tiroides). Se utiliza tambin para tratar algunos tipos de cncer tiroideo junto con la [place] y otros medicamentos. Este medicamento puede ser utilizado para otros usos; si tiene alguna pregunta consulte con su proveedor de atencin mdica o con su farmacutico. MARCAS COMUNES: Estre, Levo-T, Levothroid, Levoxyl, Synthroid, Thyro-Tabs, Unithroid [employer] a mi profesional de la salud antes de tomar este medicamento? Necesita saber si usted presenta alguno de los [employer] o situaciones: -angina -problemas de coagulacin -diabetes -si est haciendo dieta o participa en un programa para bajar de peso -problemas de fertilidad -enfermedad cardiaca -altos niveles de la hormona tiroidea -problema de la glndula pituitaria -ataque cardaco previo -una reaccin alrgica o inusual a la levotiroxina, otras hormonas tiroideas, a otros medicamentos, alimentos, colorantes o conservantes -si est embarazada o buscando quedar embarazada -si est amamantando a un beb Cmo debo utilizar este medicamento? Cel[employer] Corporation medicamento por va oral con agua abundante. Es mejor tomar este medicamento con el estmago vaco por lo menos 30 minutos antes o 2 horas despus de una comida. Siga las instrucciones de la etiqueta del [place]. Tmelo a la United Technologies Corporation. No tome su medicamento con una frecuencia mayor que la indicada. Hable con su pediatra para informarse acerca del uso de este medicamento en nios. Aunque este medicamento ha sido recetado a nios y bebs tan menores como unos pocos das de edad para  condiciones selectivas, las precauciones se aplican. Para los bebs, puede triturar la tableta y mezclarla con una pequea cantidad (5-10 ml o 1 a 2 cucharaditas) de agua, leche materna o formula del lactante sin soya. No lo mezcle con formula del lactante a base de soya. Sobredosis: Pngase en contacto inmediatamente con un centro toxicolgico o una sala de urgencia si usted cree que haya tomado demasiado medicamento. ATENCIN: [employer] es solo para usted. No comparta este medicamento con nadie. Qu sucede si me olvido de una dosis? Si olvida una dosis, tmela lo antes posible. Si es casi la hora de la prxima dosis, tome slo esa dosis. No tome dosis adicionales o dobles. Qu puede interactuar con este medicamento? -amiodarona -anticidos -medicamentos antitiroideos -suplementos de calcio -carbamazepina -colestiramina -colestipol -digoxina -hormonas femeninas, incluyendo el contraconceptivo o las pldoras anticonceptivas -suplementos de hierro -Personal assistant -productos lquidos de nutricin, como Ensure -medicamentos para resfros y Primary school teacher -medicamentos para la diabetes -medicamentos para la depresin mental -medicamentos o productos a base de hierbas para bajar de peso o reducir el apetito -fenobarbital u otros barbitricos -fenitona -prednisona u otros corticosteroides -rifabutina -rifampicina -isoflavonas de soya -sucralfato -teofilina -warfarina Puede ser que esta lista no menciona todas las posibles interacciones. Informe a su profesional de KB [employer]	[place] de [employer] Corporation productos a base de hierbas, medicamentos de [place] o suplementos nutritivos que est tomando. Si usted fuma, consume bebidas alcohlicas o si utiliza drogas ilegales, indqueselo tambin a su profesional de KB Home	[place]. Algunas sustancias pueden interactuar con su medicamento. A qu debo estar atento al usar Coca-Cola? Asegrese de tomar Coca-Cola con lquidos en abundancia.  Ciertas marcas de las tabletas pueden provocar asfixia, arcadas o dificultad al tragar a caso de que la tableta se le puede  quedar atascada en la garganta. La mayora de estos problemas se desaparecen cuando tome el medicamento con la cantidad [place] de agua u otros lquidos. No cambie de marca de Coca-Cola a menos que su profesional de la salud est de acuerdo. Consulte con su profesional de la salud si no est seguro. Necesitar realizarse [employer] regularmente y [place] de sangre de [place] en cuando para evaluar su respuesta al [place]. Si est News Corporation para el hipotiroidismo, pueden transcurrir varias semanas antes de que observe Southwest Airlines. Consulte con su mdico o con su profesional de la salud si sus sntomas no mejoran. Tal vez deba usar este medicamento de por vida. No deje de usarlo a menos que su mdico o su profesional de KB Home	[place] as lo indique. Este medicamento puede afectar su nivel de Dispensing optician. Si tiene diabetes, controle su nivel de azcar como se le haya indicado. Es posible que se le caiga un poco el cabello al comenzar [place]. Por lo general este problema se resuelve solo. Si va a someterse a una operacin, informe a su mdico o a su profesional de la salud que est tomando Coca-Cola. Qu efectos secundarios puedo tener al Masco Corporation este medicamento? Efectos secundarios que debe informar a su mdico o a Barrister's clerk de la salud tan pronto como sea posible: -Chief of Staff como erupcin cutnea, picazn o urticarias, hinchazn de la cara, labios o lengua -dolor en el pecho -sudoracin excesiva o intolerancia al calor -pulso cardiaco rpido o irregular -nerviosismo -erupcin cutnea o urticaria -hinchazn de tobillos, pies o piernas -temblores Efectos secundarios que, por lo general, no requieren atencin mdica (debe informarlos a su mdico o a su profesional de la salud si persisten o si son molestos): -cambios en el  apetito -cambios en los perodos menstruales -diarrea -cada del cabello -dolor de cabeza -problemas para dormir -prdida de peso Puede ser que esta lista no menciona todos los posibles efectos secundarios. Comunquese a su mdico por asesoramiento mdico Humana Inc. Usted puede informar los efectos secundarios a la FDA por telfono al 1-800-FDA-1088. Dnde debo guardar mi medicina? Mantngala fuera del alcance de los nios. [place] a [employer], entre 15 y 42 grados C (31 y 60 grados F). Protjala de la luz y de la humedad. Mantenga el envase bien cerrado. Deseche todo el medicamento que no haya utilizado, despus de la fecha de vencimiento. ATENCIN: Este folleto es un resumen. Puede ser que no cubra toda la posible informacin. Si usted tiene preguntas acerca de esta medicina, consulte con su mdico, su farmacutico o su profesional de Technical sales engineer.  2018 Elsevier/Gold Standard (2014-10-18 00:00:00)

## 2018-06-25 NOTE — Progress Notes (Signed)
GYNECOLOGY  VISIT   HPI: 47 y.o.   Married  Hispanic  female   [phone] with Patient's last menstrual period was 06/17/2018 (exact date).   here for consult.  New dx hypothyroidism and has heavy menses and dysmenorrhea.   Interpretor present for the entire visit.   Elevated TSH 39.870 and low free T4 0.44 on 06/09/18.  Pelvic US done 06/04/18 for heavy menses and dysmenorrhea.  This showed: Posterior right fibroid - 3.6 x 2.9 cm with degeneration.  Echogenic mass of the endometrium 24 x 10 mm with feeder vessel.  2.3 cm right ovarian follicle.  No free fluid.  Noting some vaginal irritation symptoms.   GYNECOLOGIC HISTORY: Patient's last menstrual period was 06/17/2018 (exact date). Contraception:  Tubal Menopausal hormone therapy:  none Last mammogram:  none Last pap smear: 05/27/18 Neg; Neg HR HPV        OB History    Gravida  3   Para  2   Term  2   Preterm  0   AB  0   Living  3     SAB  0   TAB  0   Ectopic  0   Multiple  0   Live Births  3              Patient Active Problem List   Diagnosis Date Noted  . Endometrial mass 06/04/2018  . Fibroid 06/04/2018  . Upper abdominal pain 01/02/2018  . Lower urinary tract symptoms (LUTS) 06/28/2017  . Acute UTI 06/28/2017    Past Medical History:  Diagnosis Date  . Allergy   . Anemia   . Constipation    occasionally- but daily or weekly   . Hypothyroidism 2019  . Thyroid disease 2005    Past Surgical History:  Procedure Laterality Date  . CESAREAN SECTION    . COLPOSCOPY    . TUBAL LIGATION    . UPPER GASTROINTESTINAL ENDOSCOPY  01/16/2018    Current Outpatient Medications  Medication Sig Dispense Refill  . ferrous sulfate (FERROUSUL) 325 (65 FE) MG tablet Take 1 tablet (325 mg total) by mouth 2 (two) times daily with a meal. 30 tablet 3  . Multiple Vitamin (MULTIVITAMIN) tablet Take 1 tablet by mouth daily.     Current Facility-Administered Medications  Medication Dose Route Frequency  Provider Last Rate Last Dose  . 0.9 %  sodium chloride infusion  500 mL Intravenous Once Armbruster, [name], MD         ALLERGIES: Patient has no known allergies.  Family History  Problem Relation Age of Onset  . Heart disease Mother   . Rectal cancer Neg Hx   . Stomach cancer Neg Hx   . Colon cancer Neg Hx   . Esophageal cancer Neg Hx   . Colon polyps Neg Hx   . Breast cancer Neg Hx     Social History   Socioeconomic History  . Marital status: Married    Spouse name: Not on file  . Number of children: 3  . Years of education: Not on file  . Highest education level: Not on file  Occupational History  . Not on file  Social Needs  . Financial resource strain: Not on file  . Food insecurity:    Worry: Not on file    Inability: Not on file  . Transportation needs:    Medical: Not on file    Non-medical: Not on file  Tobacco Use  . Smoking status: Never  Smoker  . Smokeless tobacco: Never Used  Substance and Sexual Activity  . Alcohol use: No  . Drug use: No  . Sexual activity: Yes    Birth control/protection: Surgical  Lifestyle  . Physical activity:    Days per week: Not on file    Minutes per session: Not on file  . Stress: Not on file  Relationships  . Social connections:    Talks on phone: Not on file    Gets together: Not on file    Attends religious service: Not on file    Active member of club or organization: Not on file    Attends meetings of clubs or organizations: Not on file    Relationship status: Not on file  . Intimate partner violence:    Fear of current or ex partner: Not on file    Emotionally abused: Not on file    Physically abused: Not on file    Forced sexual activity: Not on file  Other Topics Concern  . Not on file  Social History Narrative  . Not on file    Review of Systems  All other systems reviewed and are negative.   PHYSICAL EXAMINATION:    BP 110/62 (BP Location: Right Arm, Patient Position: Sitting, Cuff Size:  Normal)   Pulse 60   Ht 5' 4.5" (1.638 m)   Wt 163 lb (73.9 kg)   LMP 06/17/2018 (Exact Date)   Breastfeeding? No   BMI 27.55 kg/m     General appearance: alert, cooperative and appears stated age Head: Normocephalic, without obvious abnormality, atraumatic Neck: no adenopathy, supple, symmetrical, trachea midline and thyroid normal to inspection and palpation Lungs: clear to auscultation bilaterally Heart: regular rate and rhythm Abdomen: soft, non-tender, no masses,  no organomegaly Extremities: extremities normal, atraumatic, no cyanosis or edema  No abnormal inguinal nodes palpated Neurologic: Grossly normal  Pelvic: External genitalia:  no lesions              Urethra:  normal appearing urethra with no masses, tenderness or lesions              Bartholins and Skenes: normal                 Vagina: normal appearing vagina with normal color and discharge, no lesions              Cervix: no lesions                Bimanual Exam:  Uterus:  normal size, contour, position, consistency, mobility, non-tender              Adnexa: no mass, fullness, tenderness              Chaperone was present for exam.  ASSESSMENT  Endometrial mass.  Dysmenorrhea. Hypothyroidism.  Vaginitis.   PLAN  We discussed hypothyroidism in detail.  Signs and symptoms, treatment with Synthroid and its importance and follow up TFTs in about 3 weeks in preparation for surgery.  Discussion of hysteroscopy with Myosure polypectomy, dilation and curettage.  Risks, benefits, and alternatives reviewed. Risks include but are not limited to bleeding, infection, damage to surrounding organs including uterine perforation requiring hospitalization and laparoscopy, pulmonary edema, reaction to anesthesia, DVT, PE, death, and need for further treatment. Surgical expectations and recovery discussed.  Patient wishes to proceed.  Affirm taken.  Tx to follow.    An After Visit Summary was printed and given to the  patient.  __25____ minutes  face to face time of which over 50% was spent in counseling.

## 2018-06-26 ENCOUNTER — Other Ambulatory Visit: Payer: Self-pay | Admitting: *Deleted

## 2018-06-26 LAB — VAGINITIS/VAGINOSIS, DNA PROBE
Candida Species: POSITIVE — AB
Gardnerella vaginalis: NEGATIVE
Trichomonas vaginosis: NEGATIVE

## 2018-06-26 MED ORDER — FLUCONAZOLE 150 MG PO TABS: 150.0000 mg | ORAL_TABLET | Freq: Once | ORAL | 0 refills | Status: AC

## 2018-06-28 ENCOUNTER — Encounter: Payer: Self-pay | Admitting: Obstetrics and Gynecology

## 2018-06-28 DIAGNOSIS — E039 Hypothyroidism, unspecified: Secondary | ICD-10-CM | POA: Insufficient documentation

## 2018-06-29 ENCOUNTER — Telehealth: Payer: Self-pay | Admitting: Obstetrics and Gynecology

## 2018-06-29 NOTE — Telephone Encounter (Signed)
Kim from Centralized Scheduling called stating this patient's surgical case needs to go through [facility], which is first come first served, because it is outside of Dr. Elza Rafter normal operating block.  Separate staff message sent.

## 2018-06-29 NOTE — Telephone Encounter (Signed)
Call to Central Scheduling. Case clarified and posted in Dr Elza Rafter block time.   Encounter closed.

## 2018-07-08 NOTE — Patient Instructions (Addendum)
Your procedure is scheduled on: Tuesday, Nov 19  Enter through the Main Entrance of [facility] at: 10 am  Pick up the phone at the desk and dial [phone].  Call this number if you have problems the morning of surgery: [phone].  Remember: Do NOT eat food or Do NOT drink clear liquids (including water) after midnight Monday.  Take these medicines the morning of surgery with a SIP OF WATER: synthroid  Brush your teeth on the day of surgery.  Stop herbal medications, vitamin supplements, Ibuprofen/NSAIDS at this time (or 1 week prior to surgery).  Do NOT wear jewelry (body piercing), metal hair clips/bobby pins, make-up, or nail polish. Do NOT wear lotions, powders, or perfumes.  You may wear deoderant. Do NOT shave for 48 hours prior to surgery. Do NOT bring valuables to the hospital. . Have a responsible adult drive you home and stay with you for 24 hours after your procedure. Home with daughter [name] cell [phone]

## 2018-07-17 ENCOUNTER — Encounter (HOSPITAL_COMMUNITY)
Admission: RE | Admit: 2018-07-17 | Discharge: 2018-07-17 | Disposition: A | Discharge status: Home / Self Care | Payer: BLUE CROSS/BLUE SHIELD | Source: Ambulatory Visit | Attending: Obstetrics and Gynecology | Admitting: Obstetrics and Gynecology

## 2018-07-17 ENCOUNTER — Encounter (HOSPITAL_COMMUNITY): Payer: Self-pay

## 2018-07-17 ENCOUNTER — Other Ambulatory Visit: Payer: Self-pay

## 2018-07-17 DIAGNOSIS — Z01812 Encounter for preprocedural laboratory examination: Secondary | ICD-10-CM | POA: Insufficient documentation

## 2018-07-17 LAB — BASIC METABOLIC PANEL
Anion gap: 8 (ref 5–15)
BUN: 18 mg/dL (ref 6–20)
CO2: 26 mmol/L (ref 22–32)
CREATININE: 0.7 mg/dL (ref 0.44–1.00)
Calcium: 8.6 mg/dL — ABNORMAL LOW (ref 8.9–10.3)
Chloride: 104 mmol/L (ref 98–111)
GFR calc Af Amer: >60 mL/min (ref >60)
GFR calc non Af Amer: >60 mL/min (ref >60)
GLUCOSE: 96 mg/dL (ref 70–99)
POTASSIUM: 3.6 mmol/L (ref 3.5–5.1)
SODIUM: 138 mmol/L (ref 135–145)

## 2018-07-17 LAB — CBC
HCT: 41.2 % (ref 36.0–46.0)
HEMOGLOBIN: 13.3 g/dL (ref 12.0–15.0)
MCH: 28.3 pg (ref 26.0–34.0)
MCHC: 32.3 g/dL (ref 30.0–36.0)
MCV: 87.7 fL (ref 80.0–100.0)
Platelets: 241 10*3/uL (ref 150–400)
RBC: 4.7 MIL/uL (ref 3.87–5.11)
RDW: 13.9 % (ref 11.5–15.5)
WBC: 5.8 10*3/uL (ref 4.0–10.5)
nRBC: 0 % (ref 0.0–0.2)

## 2018-07-17 NOTE — Progress Notes (Signed)
Used [employer], Administrator, sports for [employer].

## 2018-07-20 ENCOUNTER — Ambulatory Visit (INDEPENDENT_AMBULATORY_CARE_PROVIDER_SITE_OTHER): Payer: BLUE CROSS/BLUE SHIELD | Admitting: Obstetrics and Gynecology

## 2018-07-20 ENCOUNTER — Encounter: Payer: Self-pay | Admitting: Obstetrics and Gynecology

## 2018-07-20 ENCOUNTER — Other Ambulatory Visit: Payer: BLUE CROSS/BLUE SHIELD

## 2018-07-20 VITALS — BP 122/80 | HR 60 | Ht 64.5 in | Wt 162.4 lb

## 2018-07-20 DIAGNOSIS — N9489 Other specified conditions associated with female genital organs and menstrual cycle: Secondary | ICD-10-CM | POA: Diagnosis not present

## 2018-07-20 DIAGNOSIS — E039 Hypothyroidism, unspecified: Secondary | ICD-10-CM | POA: Diagnosis not present

## 2018-07-20 NOTE — Progress Notes (Signed)
GYNECOLOGY  VISIT   HPI: 47 y.o.   Married  Hispanic  female   [phone] with Patient's last menstrual period was 07/16/2018 (exact date).   here for surgical consult and TFTs.   Interpretor and daughter present for the discussion portion of the visit.   Had endometrial mass with feeder vessel.  Also has degenerating fibroid.  Normal ovaries.   Started Synthroid about 3 weeks ago for hypothyroidism.  Feeling better.  Has some blurry vision for one month.   GYNECOLOGIC HISTORY: Patient's last menstrual period was 07/16/2018 (exact date). Contraception:  Tubal Menopausal hormone therapy:  none Last mammogram:  06-23-18 3D Neg/density D/BiRads1 Last pap smear: 05-27-18 Neg:Neg HR HPV        OB History    Gravida  3   Para  2   Term  2   Preterm  0   AB  0   Living  3     SAB  0   TAB  0   Ectopic  0   Multiple  0   Live Births  3              Patient Active Problem List   Diagnosis Date Noted  . Hypothyroidism 06/28/2018  . Endometrial mass 06/04/2018  . Fibroid 06/04/2018  . Upper abdominal pain 01/02/2018  . Lower urinary tract symptoms (LUTS) 06/28/2017  . Acute UTI 06/28/2017    Past Medical History:  Diagnosis Date  . Allergy   . Anemia   . Constipation    occasionally- but daily or weekly   . Hypothyroidism 2019  . Hypothyroidism 2019  . Thyroid disease 2005    Past Surgical History:  Procedure Laterality Date  . CESAREAN SECTION     x 1  . COLPOSCOPY    . TUBAL LIGATION    . UPPER GASTROINTESTINAL ENDOSCOPY  01/16/2018    Current Outpatient Medications  Medication Sig Dispense Refill  . ferrous sulfate (FERROUSUL) 325 (65 FE) MG tablet Take 1 tablet (325 mg total) by mouth 2 (two) times daily with a meal. 30 tablet 3  . levothyroxine (SYNTHROID) 50 MCG tablet Take 1 tablet (50 mcg total) by mouth daily. One po qd 30 tablet 11  . Multiple Vitamin (MULTIVITAMIN) tablet Take 1 tablet by mouth daily.     Current  Facility-Administered Medications  Medication Dose Route Frequency Provider Last Rate Last Dose  . 0.9 %  sodium chloride infusion  500 mL Intravenous Once Armbruster, [name], MD         ALLERGIES: Patient has no known allergies.  Family History  Problem Relation Age of Onset  . Heart disease Mother   . Rectal cancer Neg Hx   . Stomach cancer Neg Hx   . Colon cancer Neg Hx   . Esophageal cancer Neg Hx   . Colon polyps Neg Hx   . Breast cancer Neg Hx     Social History   Socioeconomic History  . Marital status: Married    Spouse name: Not on file  . Number of children: 3  . Years of education: Not on file  . Highest education level: Not on file  Occupational History  . Not on file  Social Needs  . Financial resource strain: Not on file  . Food insecurity:    Worry: Not on file    Inability: Not on file  . Transportation needs:    Medical: Not on file    Non-medical: Not on file  Tobacco Use  . Smoking status: Never Smoker  . Smokeless tobacco: Never Used  Substance and Sexual Activity  . Alcohol use: No  . Drug use: No  . Sexual activity: Yes    Birth control/protection: Surgical  Lifestyle  . Physical activity:    Days per week: Not on file    Minutes per session: Not on file  . Stress: Not on file  Relationships  . Social connections:    Talks on phone: Not on file    Gets together: Not on file    Attends religious service: Not on file    Active member of club or organization: Not on file    Attends meetings of clubs or organizations: Not on file    Relationship status: Not on file  . Intimate partner violence:    Fear of current or ex partner: Not on file    Emotionally abused: Not on file    Physically abused: Not on file    Forced sexual activity: Not on file  Other Topics Concern  . Not on file  Social History Narrative  . Not on file    Review of Systems  All other systems reviewed and are negative.   PHYSICAL EXAMINATION:    BP 122/80  (BP Location: Right Arm, Patient Position: Sitting, Cuff Size: Large)   Pulse 60   Ht 5' 4.5" (1.638 m)   Wt 162 lb 6.4 oz (73.7 kg)   LMP 07/16/2018 (Exact Date)   BMI 27.45 kg/m     General appearance: alert, cooperative and appears stated age Head: Normocephalic, without obvious abnormality, atraumatic Neck: no adenopathy, supple, symmetrical, trachea midline and thyroid normal to inspection and palpation Lungs: clear to auscultation bilaterally Heart: regular rate and rhythm Abdomen: soft, non-tender, no masses,  no organomegaly Extremities: extremities normal, atraumatic, no cyanosis or edema Skin: Skin color, texture, turgor normal. No rashes or lesions Lymph nodes: Cervical, supraclavicular, and axillary nodes normal. No abnormal inguinal nodes palpated Neurologic: Grossly normal  Pelvic: External genitalia:  no lesions              Urethra:  normal appearing urethra with no masses, tenderness or lesions              Bartholins and Skenes: normal                 Vagina: normal appearing vagina with normal color and discharge, no lesions              Cervix: no lesions                Bimanual Exam:  Uterus:  normal size, contour, position, consistency, mobility, non-tender              Adnexa: no mass, fullness, tenderness        Chaperone was present for exam.  ASSESSMENT  Status post BTL.  Endometrial mass.  Hypothyroidism.  Visual change.   PLAN  Proceed with hysteroscopy with Myosure resection of endometrial mass, dilation and curettage.  Risks, benefits, and alternatives reviewed with the patient who wishes to proceed.  TFTs today.  We discussed that her thyroid function will require some time to regulate and that her dosage may be altered. I recommend that she see her ophthalmologist for an eye exam.  An After Visit Summary was printed and given to the patient.  __15____ minutes face to face time of which over 50% was spent in counseling.

## 2018-07-21 LAB — T4, FREE: FREE T4: 1.07 ng/dL (ref 0.82–1.77)

## 2018-07-21 LAB — TSH: TSH: 16.12 u[IU]/mL — AB (ref 0.450–4.500)

## 2018-07-27 NOTE — H&P (Signed)
Office Visit   07/20/2018 Kelli Hale, Everardo All, MD  Obstetrics and Gynecology   Endometrial mass +1 more  Dx   Surgical Consult   ; Referred by Horald Pollen, MD  Reason for Visit   Additional Documentation   Vitals:   BP 122/80 (BP Location: Right Arm, Patient Position: Sitting, Cuff Size: Large)   Pulse 60   Ht 5' 4.5" (1.638 m)   Wt 73.7 kg   LMP 07/16/2018 (Exact Date)   BMI 27.45 kg/m   BSA 1.83 m   Flowsheets:   MEWS Score,   Anthropometrics     Encounter Info:   Billing Info,   History,   Allergies,   Detailed Report     All Notes   Progress Notes by Nunzio Cobbs, MD at 07/20/2018 3:30 PM  Author: Nunzio Cobbs, MD Author Type: Physician Filed: 07/23/2018 6:26 AM  Note Status: Signed Cosign: Cosign Not Required Encounter Date: 07/20/2018  Editor: Nunzio Cobbs, MD (Physician)  Prior Versions: 1. Lowella Fairy, CMA (Certified Medical Assistant) at 07/20/2018 3:52 PM - Sign at close encounter    GYNECOLOGY  VISIT   HPI: 47 y.o.   Married  Hispanic  female   [phone] with Patient's last menstrual period was 07/16/2018 (exact date).   here for surgical consult and TFTs.   Interpretor and daughter present for the discussion portion of the visit.   Had endometrial mass with feeder vessel.  Also has degenerating fibroid.  Normal ovaries.   Started Synthroid about 3 weeks ago for hypothyroidism.  Feeling better.  Has some blurry vision for one month.   GYNECOLOGIC HISTORY: Patient's last menstrual period was 07/16/2018 (exact date). Contraception:  Tubal Menopausal hormone therapy:  none Last mammogram:  06-23-18 3D Neg/density D/BiRads1 Last pap smear: 05-27-18 Neg:Neg HR HPV                OB History    Gravida  3   Para  2   Term  2   Preterm  0   AB  0   Living  3     SAB  0   TAB  0   Ectopic  0   Multiple  0   Live Births  3                  Patient Active Problem List   Diagnosis Date Noted  . Hypothyroidism 06/28/2018  . Endometrial mass 06/04/2018  . Fibroid 06/04/2018  . Upper abdominal pain 01/02/2018  . Lower urinary tract symptoms (LUTS) 06/28/2017  . Acute UTI 06/28/2017        Past Medical History:  Diagnosis Date  . Allergy   . Anemia   . Constipation    occasionally- but daily or weekly   . Hypothyroidism 2019  . Hypothyroidism 2019  . Thyroid disease 2005         Past Surgical History:  Procedure Laterality Date  . CESAREAN SECTION     x 1  . COLPOSCOPY    . TUBAL LIGATION    . UPPER GASTROINTESTINAL ENDOSCOPY  01/16/2018          Current Outpatient Medications  Medication Sig Dispense Refill  . ferrous sulfate (FERROUSUL) 325 (65 FE) MG tablet Take 1 tablet (325 mg total) by mouth 2 (two) times daily with a meal. 30 tablet 3  . levothyroxine (SYNTHROID)  50 MCG tablet Take 1 tablet (50 mcg total) by mouth daily. One po qd 30 tablet 11  . Multiple Vitamin (MULTIVITAMIN) tablet Take 1 tablet by mouth daily.              Current Facility-Administered Medications  Medication Dose Route Frequency Provider Last Rate Last Dose  . 0.9 %  sodium chloride infusion  500 mL Intravenous Once Armbruster, [name], MD         ALLERGIES: Patient has no known allergies.       Family History  Problem Relation Age of Onset  . Heart disease Mother   . Rectal cancer Neg Hx   . Stomach cancer Neg Hx   . Colon cancer Neg Hx   . Esophageal cancer Neg Hx   . Colon polyps Neg Hx   . Breast cancer Neg Hx     Social History        Socioeconomic History  . Marital status: Married    Spouse name: Not on file  . Number of children: 3  . Years of education: Not on file  . Highest education level: Not on file  Occupational History  . Not on file  Social Needs  . Financial resource strain: Not on file  . Food insecurity:    Worry: Not on file     Inability: Not on file  . Transportation needs:    Medical: Not on file    Non-medical: Not on file  Tobacco Use  . Smoking status: Never Smoker  . Smokeless tobacco: Never Used  Substance and Sexual Activity  . Alcohol use: No  . Drug use: No  . Sexual activity: Yes    Birth control/protection: Surgical  Lifestyle  . Physical activity:    Days per week: Not on file    Minutes per session: Not on file  . Stress: Not on file  Relationships  . Social connections:    Talks on phone: Not on file    Gets together: Not on file    Attends religious service: Not on file    Active member of club or organization: Not on file    Attends meetings of clubs or organizations: Not on file    Relationship status: Not on file  . Intimate partner violence:    Fear of current or ex partner: Not on file    Emotionally abused: Not on file    Physically abused: Not on file    Forced sexual activity: Not on file  Other Topics Concern  . Not on file  Social History Narrative  . Not on file    Review of Systems  All other systems reviewed and are negative.   PHYSICAL EXAMINATION:    BP 122/80 (BP Location: Right Arm, Patient Position: Sitting, Cuff Size: Large)   Pulse 60   Ht 5' 4.5" (1.638 m)   Wt 162 lb 6.4 oz (73.7 kg)   LMP 07/16/2018 (Exact Date)   BMI 27.45 kg/m     General appearance: alert, cooperative and appears stated age Head: Normocephalic, without obvious abnormality, atraumatic Neck: no adenopathy, supple, symmetrical, trachea midline and thyroid normal to inspection and palpation Lungs: clear to auscultation bilaterally Heart: regular rate and rhythm Abdomen: soft, non-tender, no masses,  no organomegaly Extremities: extremities normal, atraumatic, no cyanosis or edema Skin: Skin color, texture, turgor normal. No rashes or lesions Lymph nodes: Cervical, supraclavicular, and axillary nodes normal. No abnormal inguinal nodes  palpated Neurologic: Grossly normal  Pelvic:  External genitalia:  no lesions              Urethra:  normal appearing urethra with no masses, tenderness or lesions              Bartholins and Skenes: normal                 Vagina: normal appearing vagina with normal color and discharge, no lesions              Cervix: no lesions                Bimanual Exam:  Uterus:  normal size, contour, position, consistency, mobility, non-tender              Adnexa: no mass, fullness, tenderness        Chaperone was present for exam.  ASSESSMENT  Status post BTL.  Endometrial mass.  Hypothyroidism.  Visual change.   PLAN  Proceed with hysteroscopy with Myosure resection of endometrial mass, dilation and curettage.  Risks, benefits, and alternatives reviewed with the patient who wishes to proceed.  TFTs today.  We discussed that her thyroid function will require some time to regulate and that her dosage may be altered. I recommend that she see her ophthalmologist for an eye exam.  An After Visit Summary was printed and given to the patient.  __15____ minutes face to face time of which over 50% was spent in counseling.

## 2018-07-28 ENCOUNTER — Encounter (HOSPITAL_COMMUNITY)
Admission: AD | Disposition: A | Discharge status: Home / Self Care | Payer: Self-pay | Source: Ambulatory Visit | Attending: Obstetrics and Gynecology

## 2018-07-28 ENCOUNTER — Encounter (HOSPITAL_COMMUNITY): Payer: Self-pay

## 2018-07-28 ENCOUNTER — Ambulatory Visit (HOSPITAL_COMMUNITY): Payer: BLUE CROSS/BLUE SHIELD | Admitting: Anesthesiology

## 2018-07-28 ENCOUNTER — Other Ambulatory Visit: Payer: Self-pay

## 2018-07-28 ENCOUNTER — Ambulatory Visit (HOSPITAL_COMMUNITY)
Admission: AD | Admit: 2018-07-28 | Discharge: 2018-07-28 | Disposition: A | Discharge status: Home / Self Care | Payer: BLUE CROSS/BLUE SHIELD | Source: Ambulatory Visit | Attending: Obstetrics and Gynecology | Admitting: Obstetrics and Gynecology

## 2018-07-28 DIAGNOSIS — D649 Anemia, unspecified: Secondary | ICD-10-CM | POA: Insufficient documentation

## 2018-07-28 DIAGNOSIS — N921 Excessive and frequent menstruation with irregular cycle: Secondary | ICD-10-CM | POA: Diagnosis not present

## 2018-07-28 DIAGNOSIS — R9389 Abnormal findings on diagnostic imaging of other specified body structures: Secondary | ICD-10-CM | POA: Insufficient documentation

## 2018-07-28 DIAGNOSIS — Z79899 Other long term (current) drug therapy: Secondary | ICD-10-CM | POA: Insufficient documentation

## 2018-07-28 DIAGNOSIS — E039 Hypothyroidism, unspecified: Secondary | ICD-10-CM | POA: Diagnosis not present

## 2018-07-28 DIAGNOSIS — N858 Other specified noninflammatory disorders of uterus: Secondary | ICD-10-CM | POA: Diagnosis not present

## 2018-07-28 DIAGNOSIS — N8500 Endometrial hyperplasia, unspecified: Secondary | ICD-10-CM | POA: Diagnosis not present

## 2018-07-28 DIAGNOSIS — N840 Polyp of corpus uteri: Secondary | ICD-10-CM | POA: Diagnosis not present

## 2018-07-28 DIAGNOSIS — Z79890 Hormone replacement therapy (postmenopausal): Secondary | ICD-10-CM | POA: Diagnosis not present

## 2018-07-28 DIAGNOSIS — N920 Excessive and frequent menstruation with regular cycle: Secondary | ICD-10-CM | POA: Diagnosis not present

## 2018-07-28 HISTORY — PX: DILATATION & CURETTAGE/HYSTEROSCOPY WITH MYOSURE: SHX6511

## 2018-07-28 LAB — PREGNANCY, URINE: PREG TEST UR: NEGATIVE

## 2018-07-28 SURGERY — DILATATION & CURETTAGE/HYSTEROSCOPY WITH MYOSURE
Anesthesia: General

## 2018-07-28 MED ORDER — ACETAMINOPHEN 160 MG/5ML PO SOLN: 325.0000 mg | ORAL | Status: DC | PRN

## 2018-07-28 MED ORDER — OXYCODONE HCL 5 MG PO TABS: 5.0000 mg | ORAL_TABLET | Freq: Once | ORAL | Status: DC | PRN

## 2018-07-28 MED ORDER — MEPERIDINE HCL 25 MG/ML IJ SOLN: 6.2500 mg | INTRAMUSCULAR | Status: DC | PRN

## 2018-07-28 MED ORDER — KETOROLAC TROMETHAMINE 30 MG/ML IJ SOLN
INTRAMUSCULAR | Status: AC
  Filled 2018-07-28: qty 1

## 2018-07-28 MED ORDER — DEXAMETHASONE SODIUM PHOSPHATE 10 MG/ML IJ SOLN
INTRAMUSCULAR | Status: AC
  Filled 2018-07-28: qty 1

## 2018-07-28 MED ORDER — SCOPOLAMINE 1 MG/3DAYS TD PT72
1.0000 | MEDICATED_PATCH | Freq: Once | TRANSDERMAL | Status: DC
  Administered 2018-07-28: 1.5 mg via TRANSDERMAL

## 2018-07-28 MED ORDER — GLYCOPYRROLATE 0.2 MG/ML IJ SOLN
INTRAMUSCULAR | Status: DC | PRN
  Administered 2018-07-28: 0.2 mg via INTRAVENOUS

## 2018-07-28 MED ORDER — SODIUM CHLORIDE 0.9 % IR SOLN
Status: DC | PRN
  Administered 2018-07-28: 3000 mL

## 2018-07-28 MED ORDER — OXYCODONE HCL 5 MG/5ML PO SOLN: 5.0000 mg | Freq: Once | ORAL | Status: DC | PRN

## 2018-07-28 MED ORDER — ACETAMINOPHEN 325 MG PO TABS: 325.0000 mg | ORAL_TABLET | ORAL | Status: DC | PRN

## 2018-07-28 MED ORDER — LIDOCAINE HCL (CARDIAC) PF 100 MG/5ML IV SOSY
PREFILLED_SYRINGE | INTRAVENOUS | Status: AC
  Filled 2018-07-28: qty 5

## 2018-07-28 MED ORDER — LACTATED RINGERS IV SOLN
INTRAVENOUS | Status: DC
  Administered 2018-07-28 (×2): via INTRAVENOUS

## 2018-07-28 MED ORDER — ONDANSETRON HCL 4 MG/2ML IJ SOLN
INTRAMUSCULAR | Status: DC | PRN
  Administered 2018-07-28: 4 mg via INTRAVENOUS

## 2018-07-28 MED ORDER — FENTANYL CITRATE (PF) 100 MCG/2ML IJ SOLN: 25.0000 ug | INTRAMUSCULAR | Status: DC | PRN

## 2018-07-28 MED ORDER — ONDANSETRON HCL 4 MG/2ML IJ SOLN
4.0000 mg | Freq: Once | INTRAMUSCULAR | Status: AC | PRN
  Administered 2018-07-28: 4 mg via INTRAVENOUS

## 2018-07-28 MED ORDER — MIDAZOLAM HCL 2 MG/2ML IJ SOLN
INTRAMUSCULAR | Status: AC
  Filled 2018-07-28: qty 2

## 2018-07-28 MED ORDER — LIDOCAINE HCL 1 % IJ SOLN
INTRAMUSCULAR | Status: AC
  Filled 2018-07-28: qty 20

## 2018-07-28 MED ORDER — ONDANSETRON HCL 4 MG/2ML IJ SOLN: 4.0000 mg | Freq: Once | INTRAMUSCULAR | Status: DC | PRN

## 2018-07-28 MED ORDER — PROPOFOL 10 MG/ML IV BOLUS
INTRAVENOUS | Status: DC | PRN
  Administered 2018-07-28: 150 mg via INTRAVENOUS

## 2018-07-28 MED ORDER — LIDOCAINE HCL (CARDIAC) PF 100 MG/5ML IV SOSY
PREFILLED_SYRINGE | INTRAVENOUS | Status: DC | PRN
  Administered 2018-07-28: 60 mg via INTRAVENOUS

## 2018-07-28 MED ORDER — GLYCOPYRROLATE 0.2 MG/ML IJ SOLN
INTRAMUSCULAR | Status: AC
  Filled 2018-07-28: qty 1

## 2018-07-28 MED ORDER — LIDOCAINE HCL 1 % IJ SOLN
INTRAMUSCULAR | Status: DC | PRN
  Administered 2018-07-28: 10 mL

## 2018-07-28 MED ORDER — ONDANSETRON HCL 4 MG/2ML IJ SOLN
INTRAMUSCULAR | Status: AC
  Filled 2018-07-28: qty 2

## 2018-07-28 MED ORDER — FENTANYL CITRATE (PF) 100 MCG/2ML IJ SOLN
INTRAMUSCULAR | Status: AC
  Filled 2018-07-28: qty 2

## 2018-07-28 MED ORDER — MIDAZOLAM HCL 2 MG/2ML IJ SOLN
INTRAMUSCULAR | Status: DC | PRN
  Administered 2018-07-28: 2 mg via INTRAVENOUS

## 2018-07-28 MED ORDER — LACTATED RINGERS IV SOLN
INTRAVENOUS | Status: DC
  Administered 2018-07-28: 125 mL/h via INTRAVENOUS

## 2018-07-28 MED ORDER — KETOROLAC TROMETHAMINE 30 MG/ML IJ SOLN
INTRAMUSCULAR | Status: DC | PRN
  Administered 2018-07-28: 30 mg via INTRAVENOUS

## 2018-07-28 MED ORDER — FENTANYL CITRATE (PF) 100 MCG/2ML IJ SOLN
INTRAMUSCULAR | Status: DC | PRN
  Administered 2018-07-28: 50 ug via INTRAVENOUS
  Administered 2018-07-28 (×2): 25 ug via INTRAVENOUS

## 2018-07-28 MED ORDER — DEXAMETHASONE SODIUM PHOSPHATE 10 MG/ML IJ SOLN
INTRAMUSCULAR | Status: DC | PRN
  Administered 2018-07-28: 10 mg via INTRAVENOUS

## 2018-07-28 MED ORDER — PROPOFOL 10 MG/ML IV BOLUS
INTRAVENOUS | Status: AC
  Filled 2018-07-28: qty 20

## 2018-07-28 MED ORDER — IBUPROFEN 800 MG PO TABS: 800.0000 mg | ORAL_TABLET | Freq: Three times a day (TID) | ORAL | 0 refills | Status: DC | PRN

## 2018-07-28 MED ORDER — SCOPOLAMINE 1 MG/3DAYS TD PT72
MEDICATED_PATCH | TRANSDERMAL | Status: AC
  Administered 2018-07-28: 1.5 mg via TRANSDERMAL
  Filled 2018-07-28: qty 1

## 2018-07-28 SURGICAL SUPPLY — 11 items
CATH ROBINSON RED A/P 16FR (CATHETERS) ×2 IMPLANT
DEVICE MYOSURE REACH (MISCELLANEOUS) ×2 IMPLANT
GLOVE BIO SURGEON STRL SZ 6.5 (GLOVE) ×2 IMPLANT
GLOVE BIOGEL PI IND STRL 7.0 (GLOVE) ×1 IMPLANT
GLOVE BIOGEL PI INDICATOR 7.0 (GLOVE) ×1
GOWN STRL REUS W/TWL LRG LVL3 (GOWN DISPOSABLE) ×4 IMPLANT
KIT PROCEDURE FLUENT (KITS) ×2 IMPLANT
PACK VAGINAL MINOR WOMEN LF (CUSTOM PROCEDURE TRAY) ×2 IMPLANT
PAD OB MATERNITY 4.3X12.25 (PERSONAL CARE ITEMS) ×2 IMPLANT
SEAL ROD LENS SCOPE MYOSURE (ABLATOR) ×2 IMPLANT
TOWEL OR 17X24 6PK STRL BLUE (TOWEL DISPOSABLE) ×4 IMPLANT

## 2018-07-28 NOTE — Anesthesia Preprocedure Evaluation (Signed)
Anesthesia Evaluation  Patient identified by MRN, date of birth, ID band Patient awake    Reviewed: Allergy & Precautions, H&P , NPO status , Patient's Chart, lab work & pertinent test results, reviewed documented beta blocker date and time   Airway Mallampati: II  TM Distance: >3 FB Neck ROM: full    Dental no notable dental hx.    Pulmonary neg pulmonary ROS,    Pulmonary exam normal breath sounds clear to auscultation       Cardiovascular Exercise Tolerance: Good negative cardio ROS   Rhythm:regular Rate:Normal     Neuro/Psych negative neurological ROS  negative psych ROS   GI/Hepatic negative GI ROS, Neg liver ROS,   Endo/Other  Hypothyroidism   Renal/GU negative Renal ROS  negative genitourinary   Musculoskeletal   Abdominal   Peds  Hematology negative hematology ROS (+) Blood dyscrasia, anemia ,   Anesthesia Other Findings   Reproductive/Obstetrics negative OB ROS                             Anesthesia Physical Anesthesia Plan  ASA: II  Anesthesia Plan: General   Post-op Pain Management:    Induction: Intravenous  PONV Risk Score and Plan: 3 and Ondansetron, Dexamethasone, Midazolam and Treatment may vary due to age or medical condition  Airway Management Planned: LMA  Additional Equipment:   Intra-op Plan:   Post-operative Plan: Extubation in OR  Informed Consent: I have reviewed the patients History and Physical, chart, labs and discussed the procedure including the risks, benefits and alternatives for the proposed anesthesia with the patient or authorized representative who has indicated his/her understanding and acceptance.   Dental Advisory Given  Plan Discussed with: CRNA, Anesthesiologist and Surgeon  Anesthesia Plan Comments: ( )        Anesthesia Quick Evaluation

## 2018-07-28 NOTE — Transfer of Care (Signed)
Immediate Anesthesia Transfer of Care Note  Patient: Kelli Hale  Procedure(s) Performed: DILATATION & CURETTAGE/HYSTEROSCOPY WITH MYOSURE (N/A )  Patient Location: PACU  Anesthesia Type:General  Level of Consciousness: awake, alert  and oriented  Airway & Oxygen Therapy: Patient Spontanous Breathing and Patient connected to nasal cannula oxygen  Post-op Assessment: Report given to RN, Post -op Vital signs reviewed and stable and Patient moving all extremities X 4  Post vital signs: Reviewed and stable  Last Vitals:  Vitals Value Taken Time  BP 128/90 07/28/2018 12:08 PM  Temp    Pulse 64 07/28/2018 12:10 PM  Resp 16 07/28/2018 12:10 PM  SpO2 100 % 07/28/2018 12:10 PM  Vitals shown include unvalidated device data.  Last Pain:  Vitals:   07/28/18 1008  TempSrc: Oral  PainSc: 8       Patients Stated Pain Goal: 3 (19/41/74 0814)  Complications: No apparent anesthesia complications

## 2018-07-28 NOTE — Op Note (Signed)
OPERATIVE REPORT   PREOPERATIVE DIAGNOSES:   Menorrhagia, endometrial thickening.   POSTOPERATIVE DIAGNOSES:   Menorrhagia, endometrial thickening.   PROCEDURE:  Hysteroscopy with dilation and curettage and Myosure resection of endometrial polyps  SURGEON:  Lenard Galloway, MD  ANESTHESIA:  LMA, paracervical block with 10 mL of 1% lidocaine.  IV FLUIDS:   1000 cc LR  EBL:  5 cc  URINE OUTPUT:  20 cc  NORMAL SALINE DEFICIT:   841 cc  COMPLICATIONS:  None.  INDICATIONS FOR THE PROCEDURE:     A plan is now made to proceed with a hysteroscopy with dilation and curettage and Myosure resection of endometrial mass; after risks, benefits and alternatives were reviewed.  FINDINGS:  Exam under anesthesia revealed a small anteverted, mobile uterus. The uterus was sounded to 7 cm Hysteroscopy showed multiple polypoid thickenings of the endometrial cavity.  The tubal ostia regions were normal. Endometrial currettings were scanty.  SPECIMENS:  Endometrial polyps and endometrial curettings were sent to Pathology separately.  PROCEDURE IN DETAIL:  The patient was reidentified in the preoperative hold area.  She received TED hose and PAS stockings for DVT prophylaxis.  In the operating room, the patient was placed in the dorsal lithotomy position and then an LMA anesthetic was introduced.  The patient's lower abdomen, vagina and perineum were sterilely prepped with Betadine and the  patient's bladder was catheterized of urine.  She was sterilly draped.  An exam under anesthesia was performed.  A speculum was placed inside the vagina and a single-tooth tenaculum was placed on the anterior cervical lip. The uterus was sounded. The cervix was dilated to a  #21 Pratt dilator.  The MyoSure hysteroscope was then inserted inside the uterine cavity  under the continuous infusion of normal  saline solution.  Findings are as noted above.   The MyoSure hysteroscope with the Reach device was used  to resect the polyps. The  specimen was sent to pathology.  The sharp and serrated curettes were introduced into  the uterine cavity and the endometrium was curetted in all 4 quadrants.  A minimal amount  of endometrial curettings was obtained.  This specimen was sent to Pathology.  A paracervical block was performed with a total of 10 mL of 1% lidocaine plain.    The single-tooth tenaculum which had been placed on the anterior cervical lip was  removed.  Hemostasis was good, and all of the vaginal instruments were removed.  The patient was awakened and escorted to the recovery room in stable condition after  she was cleansed with Betadine.  There were no complications to the procedure.  All  needle, instrument and sponge counts were correct.  Lenard Galloway, MD

## 2018-07-28 NOTE — Anesthesia Procedure Notes (Signed)
Procedure Name: LMA Insertion Date/Time: 07/28/2018 11:28 AM Performed by: Janeece Riggers, MD Pre-anesthesia Checklist: Patient identified, Patient being monitored, Emergency Drugs available, Timeout performed and Suction available Patient Re-evaluated:Patient Re-evaluated prior to induction Oxygen Delivery Method: Circle System Utilized Preoxygenation: Pre-oxygenation with 100% oxygen Induction Type: IV induction Ventilation: Mask ventilation without difficulty LMA: LMA inserted LMA Size: 4.0 Number of attempts: 1 Placement Confirmation: positive ETCO2 and breath sounds checked- equal and bilateral

## 2018-07-28 NOTE — Progress Notes (Signed)
Update to History and Physical  No marked change in status since office preop visit.  Has periodic posterior neck pain, and this is bothering her today.  Denies HA or fever.  Patient examined.  OK to proceed with surgery.

## 2018-07-28 NOTE — Anesthesia Postprocedure Evaluation (Signed)
Anesthesia Post Note  Patient: Kelli Hale  Procedure(s) Performed: DILATATION & CURETTAGE/HYSTEROSCOPY WITH MYOSURE (N/A )     Patient location during evaluation: PACU Anesthesia Type: General Level of consciousness: awake and alert Pain management: pain level controlled Vital Signs Assessment: post-procedure vital signs reviewed and stable Respiratory status: spontaneous breathing, nonlabored ventilation, respiratory function stable and patient connected to nasal cannula oxygen Cardiovascular status: blood pressure returned to baseline and stable Postop Assessment: no apparent nausea or vomiting Anesthetic complications: no    Last Vitals:  Vitals:   07/28/18 1315 07/28/18 1325  BP: 117/85   Pulse: (!) 59 61  Resp: 18 17  Temp:    SpO2: 97% 98%    Last Pain:  Vitals:   07/28/18 1300  TempSrc:   PainSc: 0-No pain   Pain Goal: Patients Stated Pain Goal: 3 (07/28/18 1300)               Evianna Chandran

## 2018-07-28 NOTE — Discharge Instructions (Signed)
Dilatacin y curetaje o curetaje por aspiracin, cuidados posteriores (Dilation and Curettage or Vacuum Curettage, Care After) Estas indicaciones le proporcionan informacin acerca de cmo deber cuidarse despus del procedimiento. El mdico tambin podr darle instrucciones especficas. Comunquese con el mdico si tiene algn problema o tiene preguntas despus del procedimiento. CUIDADOS EN EL HOGAR  No conduzca durante 24horas.  Espere 1 semana antes de realizar [employer] la [employer].  [place] (2) veces al da, durante 4 [place]. Antelas. Comunquese con su mdico si tiene fiebre.  No permanezca de pie durante mucho tiempo.  No levante, empuje ni jale objetos de ms de 10 libras (4,5 kilogramos).  Solo suba escaleras una o dos veces por da.  Descanse con frecuencia.  Siga con su dieta habitual.  Beba suficiente lquido para mantener el pis (orina) claro o de color amarillo plido.  Si tiene dificultad para Administrator, sports (estreimiento), puede hacer lo siguiente: ? Tome algn medicamento que la ayude a Film/video editor intestino (laxante) segn las indicaciones de su mdico. ? Consuma ms fruta y salvado. ? Beba ms lquidos.  [employer] ducha, no un bao de inmersin, durante el tiempo que le indique su mdico.  No practique natacin ni use el jacuzzi hasta que el mdico la autorice.  Pdale a alguien que se quede con usted durante 1 o 2das despus del procedimiento.  No se haga duchas vaginales, no use tampones ni tenga sexo (relaciones sexuales) durante 2 semanas.  Solo tome los [employer] le haya indicado su mdico. No tome aspirina. Puede ocasionar hemorragias.  Cumpla con los controles mdicos.  SOLICITE AYUDA SI:  Tiene clicos o siente un dolor que no puede controlar con los medicamentos.  Siente dolor en el vientre (abdomen).  Advierte un olor ftido que proviene de la vagina.  Tiene una erupcin cutnea.  Tiene problemas con  los medicamentos.  SOLICITE AYUDA DE INMEDIATO SI:  Tiene una hemorragia ms abundante que un perodo normal.  Tiene fiebre.  Siente dolor en el pecho.  Tiene dificultad para respirar.  Se siente mareada o siente como si fuera a desmayarse (vahdo).  Se desmaya.  Siente dolor en la zona superior de los hombros.  Tiene una hemorragia vaginal, con o sin grumos de sangre (cogulos sanguneos).  ASEGRESE DE QUE:  Comprende estas instrucciones.  Controlar su afeccin.  Recibir ayuda de inmediato si no mejora o si empeora.  Esta informacin no tiene Marine scientist el consejo del mdico. Asegrese de hacerle al mdico cualquier pregunta que tenga. Document Released: 04/24/2011 Document Revised: 08/31/2013 Document Reviewed: 03/25/2013 Elsevier Interactive Patient Education  2017 [place] (Hysteroscopy, Care After) Siga estas instrucciones durante las prximas semanas. Estas indicaciones le proporcionan informacin general acerca de cmo deber cuidarse despus del procedimiento. El mdico tambin podr darle instrucciones ms especficas. El tratamiento se ha planificado de acuerdo a las prcticas mdicas actuales, pero a veces se producen problemas. Comunquese con el mdico si tiene algn problema o tiene dudas despus del procedimiento. QU ESPERAR DESPUS DEL PROCEDIMIENTO Despus del procedimiento, es tpico tener las siguientes sensaciones:  Podr sentir clicos leves. Es normal que esto dure un par [employer].  Aumenta el sangrado. Puede variar desde un sangrado ligero durante un par de Intel un sangrado similar al menstrual por 3 - 7 das. [place] durante los primeros 1-2 das despus del procedimiento.  Tome slo medicamentos de venta [place] o  recetados, segn las indicaciones del mdico. No tome aspirina. La aspirina puede aumentar el riesgo de sangrado.  Tome slo duchas y  no baos durante 2 semanas, segn las indicaciones de su mdico.  No conduzca por 24 horas o siga las indicaciones de su mdico.  No beba alcohol si toma analgsicos.  No utilice tampones, duchas vaginales ni tenga relaciones sexuales durante 2 semanas o hasta que el profesional la autorice.  Tmese la [employer] por da, durante 4  5 das. Antela cada vez.  Siga las indicaciones de su mdico acerca de la dieta, la actividad fsica y levantar objetos pesados.  Si est constipada, usted puede: ? Tomar un laxante suave si su mdico la autoriza. ? Agregar salvado a su dieta. ? Beba suficiente lquido para mantener la orina clara o de color amarillo plido.  Pdale a alguna persona que permanezca con usted durante las primeras 24 a 48 horas, especialmente si le han administrado anestesia general.  Concurra a las consultas de control con su mdico segn las indicaciones.  SOLICITE ATENCIN MDICA SI:  Se siente mareado o sufre un desmayo.  Tiene Higher education careers adviser (nuseas).  Tiene flujo vaginal anormal.  Tiene una erupcin.  Aumenta el dolor y no puede controlarlo con Conservation officer, nature.  SOLICITE ATENCIN MDICA DE INMEDIATO SI:  Tiene cogulos de sangre o una hemorragia ms abundante que un perodo menstrual normal.  Tiene fiebre.  Aumenta el dolor y no puede controlarlo con Conservation officer, nature.  Siente un dolor nuevo en el vientre (abdominal).  Se desmaya.  Tiene dolor en los hombros (en la zona donde van los breteles).  Le falta el aire.  Esta informacin no tiene Marine scientist el consejo del mdico. Asegrese de hacerle al mdico cualquier pregunta que tenga. Document Released: 06/16/2013 Document Revised: 06/16/2013 Document Reviewed: 03/25/2013 Elsevier Interactive Patient Education  2017 [employer].

## 2018-07-29 ENCOUNTER — Encounter (HOSPITAL_COMMUNITY): Payer: Self-pay | Admitting: Obstetrics and Gynecology

## 2018-08-05 ENCOUNTER — Other Ambulatory Visit: Payer: Self-pay

## 2018-08-05 ENCOUNTER — Ambulatory Visit (INDEPENDENT_AMBULATORY_CARE_PROVIDER_SITE_OTHER): Payer: BLUE CROSS/BLUE SHIELD | Admitting: Obstetrics and Gynecology

## 2018-08-05 ENCOUNTER — Encounter: Payer: Self-pay | Admitting: Obstetrics and Gynecology

## 2018-08-05 VITALS — BP 122/74 | HR 64 | Ht 64.5 in | Wt 163.6 lb

## 2018-08-05 DIAGNOSIS — E039 Hypothyroidism, unspecified: Secondary | ICD-10-CM | POA: Diagnosis not present

## 2018-08-05 DIAGNOSIS — Z9889 Other specified postprocedural states: Secondary | ICD-10-CM

## 2018-08-05 NOTE — Progress Notes (Addendum)
GYNECOLOGY  VISIT   HPI: 47 y.o.   Married  Hispanic  female   [phone] with Patient's last menstrual period was 07/16/2018 (exact date).   here for 2 week follow up [place] (N/A ).   Chart reviewed with interpreter [phone]. Interpretor later arrived at end of consultation.   Had a little cramping and bleeding following her procedure. Took Ibuprofen for 3 days post op.  Will have TFTs checked today.     GYNECOLOGIC HISTORY: Patient's last menstrual period was 07/16/2018 (exact date). Contraception:  Tubal Menopausal hormone therapy:  none Last mammogram: 06-23-18 3D Neg/density D/BiRads1 Last pap smear:  2018 - [place] - normal per patient.   05/27/18 - Normal, neg HR HPV        OB History    Gravida  3   Para  2   Term  2   Preterm  0   AB  0   Living  3     SAB  0   TAB  0   Ectopic  0   Multiple  0   Live Births  3              Patient Active Problem List   Diagnosis Date Noted  . Hypothyroidism 06/28/2018  . Endometrial mass 06/04/2018  . Fibroid 06/04/2018  . Upper abdominal pain 01/02/2018  . Lower urinary tract symptoms (LUTS) 06/28/2017  . Acute UTI 06/28/2017    Past Medical History:  Diagnosis Date  . Allergy   . Anemia   . Constipation    occasionally- but daily or weekly   . Hypothyroidism 2019  . Hypothyroidism 2019  . Thyroid disease 2005    Past Surgical History:  Procedure Laterality Date  . CESAREAN SECTION     x 1  . COLPOSCOPY    . DILATATION & CURETTAGE/HYSTEROSCOPY WITH MYOSURE N/A 07/28/2018   Procedure: DILATATION & CURETTAGE/HYSTEROSCOPY WITH MYOSURE;  Surgeon: Nunzio Cobbs, MD;  Location: [facility];  Service: Gynecology;  Laterality: N/A;  rep will be here confirmed on 07/27/18  . TUBAL LIGATION    . UPPER GASTROINTESTINAL ENDOSCOPY  01/16/2018    Current Outpatient Medications  Medication Sig Dispense Refill  . ferrous sulfate (FERROUSUL) 325 (65 FE) MG  tablet Take 1 tablet (325 mg total) by mouth 2 (two) times daily with a meal. 30 tablet 3  . ibuprofen (ADVIL,MOTRIN) 800 MG tablet Take 1 tablet (800 mg total) by mouth every 8 (eight) hours as needed. 30 tablet 0  . levothyroxine (SYNTHROID) 50 MCG tablet Take 1 tablet (50 mcg total) by mouth daily. One po qd 30 tablet 11  . Multiple Vitamin (MULTIVITAMIN) tablet Take 1 tablet by mouth daily.     No current facility-administered medications for this visit.      ALLERGIES: Patient has no known allergies.  Family History  Problem Relation Age of Onset  . Heart disease Mother   . Rectal cancer Neg Hx   . Stomach cancer Neg Hx   . Colon cancer Neg Hx   . Esophageal cancer Neg Hx   . Colon polyps Neg Hx   . Breast cancer Neg Hx     Social History   Socioeconomic History  . Marital status: Married    Spouse name: Not on file  . Number of children: 3  . Years of education: Not on file  . Highest education level: Not on file  Occupational History  . Not  on file  Social Needs  . Financial resource strain: Not on file  . Food insecurity:    Worry: Not on file    Inability: Not on file  . Transportation needs:    Medical: Not on file    Non-medical: Not on file  Tobacco Use  . Smoking status: Never Smoker  . Smokeless tobacco: Never Used  Substance and Sexual Activity  . Alcohol use: No  . Drug use: No  . Sexual activity: Yes    Birth control/protection: Surgical  Lifestyle  . Physical activity:    Days per week: Not on file    Minutes per session: Not on file  . Stress: Not on file  Relationships  . Social connections:    Talks on phone: Not on file    Gets together: Not on file    Attends religious service: Not on file    Active member of club or organization: Not on file    Attends meetings of clubs or organizations: Not on file    Relationship status: Not on file  . Intimate partner violence:    Fear of current or ex partner: Not on file    Emotionally abused:  Not on file    Physically abused: Not on file    Forced sexual activity: Not on file  Other Topics Concern  . Not on file  Social History Narrative  . Not on file    Review of Systems  All other systems reviewed and are negative.   PHYSICAL EXAMINATION:    BP 122/74 (BP Location: Right Arm, Patient Position: Sitting, Cuff Size: Normal)   Pulse 64   Ht 5' 4.5" (1.638 m)   Wt 163 lb 9.6 oz (74.2 kg)   LMP 07/16/2018 (Exact Date)   BMI 27.65 kg/m     General appearance: alert, cooperative and appears stated age   Pelvic: External genitalia:  no lesions              Urethra:  normal appearing urethra with no masses, tenderness or lesions              Bartholins and Skenes: normal                 Vagina: normal appearing vagina with normal color and discharge, no lesions              Cervix: no lesions.  Small amount of bleeding noted.                 Bimanual Exam:  Uterus:   9 week size uterus, globally enlarged, minimally tender.               Adnexa: no mass, fullness, tenderness         Chaperone was present for exam.  ASSESSMENT  Status post hysteroscopy with polypectomy and dilation and curettage. Fibroid uterus.  Hypothyroidism.   PLAN  Final pathology reviewed with patient.  Will check TFTs again today.  Follow up for annual exam and prn.   An After Visit Summary was printed and given to the patient.

## 2018-08-06 LAB — TSH: TSH: 10.52 u[IU]/mL — AB (ref 0.450–4.500)

## 2018-08-06 LAB — T4, FREE: Free T4: 1.11 ng/dL (ref 0.82–1.77)

## 2018-08-07 NOTE — Addendum Note (Signed)
Addended by: Yisroel Ramming, Dietrich Pates E on: 08/07/2018 02:28 PM   Modules accepted: Orders

## 2018-08-17 ENCOUNTER — Telehealth: Payer: Self-pay | Admitting: *Deleted

## 2018-08-17 NOTE — Telephone Encounter (Signed)
Patient notified. Verbalized understanding. Lab appt scheduled 09/30/18  Patient asks how much longer she should stay abstinent since her surgery?    Dr. Quincy Simmonds please advise.

## 2018-08-17 NOTE — Telephone Encounter (Signed)
LM for pt to call back.

## 2018-08-17 NOTE — Telephone Encounter (Signed)
Patient may resume sexual activity.

## 2018-08-17 NOTE — Telephone Encounter (Signed)
Patient notified.  Verbalized understanding. 

## 2018-08-17 NOTE — Telephone Encounter (Signed)
-----   Message from Nunzio Cobbs, MD sent at 08/07/2018  2:28 PM EST ----- Please let patient know her results.  You may need an interpretor.   Her thyroid testing continues to improve further.  It is not normal yet.  I recommend she continue on her current dosage.   She will need retesting again in 6 weeks.  Please make an appointment for a lab visit.  I will place future orders.

## 2018-09-30 ENCOUNTER — Other Ambulatory Visit (INDEPENDENT_AMBULATORY_CARE_PROVIDER_SITE_OTHER): Payer: BLUE CROSS/BLUE SHIELD

## 2018-09-30 DIAGNOSIS — E039 Hypothyroidism, unspecified: Secondary | ICD-10-CM

## 2018-10-01 LAB — T4, FREE: FREE T4: 0.92 ng/dL (ref 0.82–1.77)

## 2018-10-01 LAB — TSH: TSH: 16.41 u[IU]/mL — ABNORMAL HIGH (ref 0.450–4.500)

## 2018-10-02 ENCOUNTER — Other Ambulatory Visit: Payer: Self-pay | Admitting: *Deleted

## 2018-10-02 MED ORDER — LEVOTHYROXINE SODIUM 75 MCG PO TABS: 75.0000 ug | ORAL_TABLET | Freq: Every day | ORAL | 10 refills | Status: DC

## 2018-11-09 ENCOUNTER — Telehealth: Payer: Self-pay | Admitting: *Deleted

## 2018-11-09 NOTE — Telephone Encounter (Signed)
Patient called. States she is experiencing back pain - upper back and neck.   Patient states she is not having any lower back pain nor pelvic pain.   Advised to follow up with PCP - per Dr. Quincy Simmonds. Patient verbalized understanding.   Routed to Dr. Quincy Simmonds to review Encounter closed.

## 2018-12-02 ENCOUNTER — Other Ambulatory Visit: Payer: Self-pay

## 2018-12-02 ENCOUNTER — Other Ambulatory Visit: Payer: BLUE CROSS/BLUE SHIELD

## 2018-12-02 DIAGNOSIS — E039 Hypothyroidism, unspecified: Secondary | ICD-10-CM | POA: Diagnosis not present

## 2018-12-04 LAB — TSH: TSH: 7.75 u[IU]/mL — AB (ref 0.450–4.500)

## 2018-12-04 LAB — T4, FREE: FREE T4: 1.22 ng/dL (ref 0.82–1.77)

## 2018-12-06 ENCOUNTER — Encounter: Payer: Self-pay | Admitting: Obstetrics and Gynecology

## 2018-12-09 ENCOUNTER — Telehealth: Payer: Self-pay

## 2018-12-09 NOTE — Telephone Encounter (Signed)
-----   Message from Nunzio Cobbs, MD sent at 12/06/2018  7:48 PM EDT ----- Please contact patient with results of thyroid testing.  Please check her DPI.  She usually comes in with a Spanish interpretor.   She needs to increase her Synthroid to 88 mcg daily.  Please send in a new prescription and give refills until her annual exam in September, 2020.  She does need another lab visit in 2 months.

## 2018-12-09 NOTE — Telephone Encounter (Signed)
Left message to call Jamesyn Lindell at [phone]. 

## 2018-12-10 MED ORDER — LEVOTHYROXINE SODIUM 88 MCG PO TABS: 88.0000 ug | ORAL_TABLET | Freq: Every day | ORAL | 1 refills | Status: DC

## 2018-12-10 NOTE — Telephone Encounter (Signed)
Patient is returning call to triage nurse. Needs interpretor.

## 2018-12-10 NOTE — Telephone Encounter (Signed)
Call placed to patient using Kelli Hale, [place], [place] # [id number]. Advised as seen below per Dr. Quincy Simmonds. Rx for synthroid 88 mcg #90/1RF to pharmacy on file. Lab appt scheduled for 6/3 at 0915. Patient verbalizes understanding and is agreeable.   Encounter closed.

## 2019-02-10 ENCOUNTER — Other Ambulatory Visit (INDEPENDENT_AMBULATORY_CARE_PROVIDER_SITE_OTHER): Payer: BC Managed Care – PPO

## 2019-02-10 ENCOUNTER — Other Ambulatory Visit: Payer: Self-pay

## 2019-02-10 DIAGNOSIS — E039 Hypothyroidism, unspecified: Secondary | ICD-10-CM

## 2019-02-11 ENCOUNTER — Telehealth: Payer: Self-pay

## 2019-02-11 LAB — TSH: TSH: 5.07 u[IU]/mL — ABNORMAL HIGH (ref 0.450–4.500)

## 2019-02-11 LAB — T4, FREE: Free T4: 1.15 ng/dL (ref 0.82–1.77)

## 2019-02-11 MED ORDER — LEVOTHYROXINE SODIUM 100 MCG PO TABS: 100.0000 ug | ORAL_TABLET | Freq: Every day | ORAL | 1 refills | Status: DC

## 2019-02-11 NOTE — Telephone Encounter (Signed)
Spoke with patient through [employer].#[id number] and advised patient of thyroid results and Dr.Silva's recommendations. Sent new Rx to pharm.on file for Synthroid 127mcg daily #90, 1R. Moved up AEX to 06-02-19 9:30am. Advised will recheck thyroid at that time.

## 2019-02-11 NOTE — Telephone Encounter (Signed)
Patient returning call to nurse  

## 2019-02-11 NOTE — Telephone Encounter (Signed)
-----   Message from Nunzio Cobbs, MD sent at 02/11/2019 11:59 AM EDT ----- Please contact patient with results of her thyroid testing.  She is almost into a normal range.  I recommend we increase her Synthroid to 100 mcg daily.  Please give her a 90 day prescription with one refill. I recommend we have her return for her annual exam in September, 2020 instead of December, 2020.  I can then recheck her thyroid function at her annual exam.   You will need an interpretor to make this call.

## 2019-02-11 NOTE — Telephone Encounter (Signed)
Called patient through interpreter [phone] and lmovm to return my call.

## 2019-05-18 ENCOUNTER — Other Ambulatory Visit: Payer: Self-pay | Admitting: Obstetrics and Gynecology

## 2019-05-18 DIAGNOSIS — Z1231 Encounter for screening mammogram for malignant neoplasm of breast: Secondary | ICD-10-CM

## 2019-06-02 ENCOUNTER — Ambulatory Visit (INDEPENDENT_AMBULATORY_CARE_PROVIDER_SITE_OTHER): Payer: BC Managed Care – PPO | Admitting: Obstetrics and Gynecology

## 2019-06-02 ENCOUNTER — Telehealth: Payer: Self-pay | Admitting: Obstetrics and Gynecology

## 2019-06-02 ENCOUNTER — Other Ambulatory Visit: Payer: Self-pay

## 2019-06-02 ENCOUNTER — Encounter: Payer: Self-pay | Admitting: Obstetrics and Gynecology

## 2019-06-02 VITALS — BP 118/70 | HR 68 | Temp 97.2°F | Resp 12 | Ht 64.75 in | Wt 166.0 lb

## 2019-06-02 DIAGNOSIS — E039 Hypothyroidism, unspecified: Secondary | ICD-10-CM | POA: Diagnosis not present

## 2019-06-02 DIAGNOSIS — R350 Frequency of micturition: Secondary | ICD-10-CM | POA: Diagnosis not present

## 2019-06-02 DIAGNOSIS — E611 Iron deficiency: Secondary | ICD-10-CM

## 2019-06-02 DIAGNOSIS — Z01419 Encounter for gynecological examination (general) (routine) without abnormal findings: Secondary | ICD-10-CM | POA: Diagnosis not present

## 2019-06-02 DIAGNOSIS — R590 Localized enlarged lymph nodes: Secondary | ICD-10-CM

## 2019-06-02 DIAGNOSIS — D219 Benign neoplasm of connective and other soft tissue, unspecified: Secondary | ICD-10-CM

## 2019-06-02 DIAGNOSIS — N852 Hypertrophy of uterus: Secondary | ICD-10-CM

## 2019-06-02 LAB — POCT URINALYSIS DIPSTICK
Bilirubin, UA: NEGATIVE
Blood, UA: NEGATIVE
Glucose, UA: NEGATIVE
Ketones, UA: NEGATIVE
Leukocytes, UA: NEGATIVE
Nitrite, UA: NEGATIVE
Protein, UA: NEGATIVE
Urobilinogen, UA: 0.2 E.U./dL
pH, UA: 6 (ref 5.0–8.0)

## 2019-06-02 NOTE — Patient Instructions (Signed)

## 2019-06-02 NOTE — Telephone Encounter (Signed)
Spoke with Danae Chen at [facility] . Thyroid US scheduled for 06/07/19 at 11:30am, arrive at 11am. [address] Location.   Call placed to patient using [facility], [place] # [id number]. Advised of appt at [facility], patient is driving, will return call to office later today for address of facility. Patient is agreeable to date and time.

## 2019-06-02 NOTE — Telephone Encounter (Signed)
Confirmed with Juliann Pulse at [facility] that thyroid US will include supraclavicular region. Juliann Pulse updated Imaging notes to include attention to left supraclavicular region.

## 2019-06-02 NOTE — Telephone Encounter (Signed)
Call placed to patient to review benefit and scheduled recommended ultrasound. Left voicemail message requesting a return call °

## 2019-06-02 NOTE — Progress Notes (Signed)
48 y.o. [id number] Married Hispanic female here for annual exam. Patient complains of having frequency and urgency. Sometimes can not empty her bladder completely, the has the urge to urinate shortly after. Patient complains of having a pain and occasional swelling in her neck.    Interpretor present for entire visit.  She is worried about the pandemic. She is having neck pain for one week, and not sleeping well due to this.  She is having left supraclavicular swelling and pain.   Her TSH was still slightly elevated in June.  Level 5.070.  Her Synthroid was increased to 100 mcg.   Menses are irregular.  They occur once a month, but the date changes.  Every 21 - 28. Flow is light, and then heavy for a few days, and 1 final light day.  Cramping prior to menses.  Manageable.   She has urinary frequency.  DF - every 30 minutes, NF - maybe once.  Caffeine - 1 coffee per day.  Water - 2 cups per day.  Drinks juice with beets and carrots.  No dysuria.   Urine dip - neg.   PCP: Horald Pollen, MD    Patient's last menstrual period was 05/17/2019.           Sexually active: Yes.    The current method of family planning is tubal ligation.    Exercising: Yes.    walking Smoker:  no  Health Maintenance: Pap:  05/27/18 Neg:Neg HR HPV History of abnormal Pap:  no MMG:  06/23/18 BIRADS 1 negative/density c -- scheduled 06/29/19 Colonoscopy:  04/30/18  BMD:   n/a  Result  n/a TDaP: 01/02/18 Gardasil:   no HIV: negative in the past Hep C: negative in the past Screening Labs: discuss today if needed Flu vaccine:  Recommended.    reports that she has never smoked. She has never used smokeless tobacco. She reports that she does not drink alcohol or use drugs.  Past Medical History:  Diagnosis Date  . Allergy   . Anemia   . Constipation    occasionally- but daily or weekly   . Fibroid   . Hypothyroidism 2019  . Hypothyroidism 2019  . Thyroid disease 2005    Past Surgical  History:  Procedure Laterality Date  . CESAREAN SECTION     x 1  . COLPOSCOPY    . DILATATION & CURETTAGE/HYSTEROSCOPY WITH MYOSURE N/A 07/28/2018   Procedure: DILATATION & CURETTAGE/HYSTEROSCOPY WITH MYOSURE;  Surgeon: Nunzio Cobbs, MD;  Location: [facility];  Service: Gynecology;  Laterality: N/A;  rep will be here confirmed on 07/27/18  . TUBAL LIGATION    . UPPER GASTROINTESTINAL ENDOSCOPY  01/16/2018    Current Outpatient Medications  Medication Sig Dispense Refill  . levothyroxine (SYNTHROID) 100 MCG tablet Take 1 tablet (100 mcg total) by mouth daily before breakfast. 90 tablet 1  . Multiple Vitamin (MULTIVITAMIN) tablet Take 1 tablet by mouth daily.    . ferrous sulfate (FERROUSUL) 325 (65 FE) MG tablet Take 1 tablet (325 mg total) by mouth 2 (two) times daily with a meal. (Patient not taking: Reported on 06/02/2019) 30 tablet 3   No current facility-administered medications for this visit.     Family History  Problem Relation Age of Onset  . Heart disease Mother   . Rectal cancer Neg Hx   . Stomach cancer Neg Hx   . Colon cancer Neg Hx   . Esophageal cancer Neg Hx   . Colon polyps  Neg Hx   . Breast cancer Neg Hx     Review of Systems  Constitutional: Negative.   HENT: Negative.   Eyes: Negative.   Respiratory: Negative.   Cardiovascular: Negative.   Gastrointestinal: Negative.   Endocrine: Negative.   Genitourinary: Positive for frequency and urgency.  Musculoskeletal:       Pain and occasional swelling in neck  Skin: Negative.   Allergic/Immunologic: Negative.   Neurological: Negative.   Hematological: Negative.   Psychiatric/Behavioral: Negative.     Exam:   BP 118/70 (BP Location: Left Arm, Patient Position: Sitting, Cuff Size: Large)   Pulse 68   Temp (!) 97.2 F (36.2 C) (Temporal)   Resp 12   Ht 5' 4.75" (1.645 m)   Wt 166 lb (75.3 kg)   LMP 05/17/2019   BMI 27.84 kg/m     General appearance: alert, cooperative and appears stated  age Head: normocephalic, without obvious abnormality, atraumatic Neck:  supple, symmetrical, trachea midline and thyroid normal to inspection and palpation.  Left supraclavicular swelling. Lungs: clear to auscultation bilaterally Breasts: normal appearance, no masses or tenderness, No nipple retraction or dimpling, No nipple discharge or bleeding, No axillary adenopathy Heart: regular rate and rhythm Abdomen: soft,  no masses, no organomegaly.  Mildly tender to deep palpation of the left lower quadrant.  Extremities: extremities normal, atraumatic, no cyanosis or edema Skin: skin color, texture, turgor normal. No rashes or lesions Lymph nodes: cervical, supraclavicular, and axillary nodes normal. Neurologic: grossly normal  Pelvic: External genitalia:  no lesions              No abnormal inguinal nodes palpated.              Urethra:  normal appearing urethra with no masses, tenderness or lesions              Bartholins and Skenes: normal                 Vagina: normal appearing vagina with normal color and discharge, no lesions              Cervix: no lesions              Pap taken: No. Bimanual Exam:  Uterus:  10 - 11 week size, globular.               Adnexa: no mass, fullness, tenderness              Rectal exam: Yes.  .  Confirms.              Anus:  normal sphincter tone, no lesions  Chaperone was present for exam.  Assessment:   Well woman visit with normal exam. Status post BTL.  Hx anemia.  Hx hyperthyroidism with enlarged thyroid on chart review. Status post radioactive iodine treatment.  Hypothyroidism now.  Left supraclavicular swelling.  Urinary frequency. Hx fibroid.  Enlarged uterus.   Plan: Mammogram screening discussed. Self breast awareness reviewed. Pap and HR HPV as above. Guidelines for Calcium, Vitamin D, regular exercise program including cardiovascular and weight bearing exercise. Labs including TFTs.  Return for pelvic US.  Will schedule neck and  thyroid ultrasound.  Follow up annually and prn.  After visit summary provided.

## 2019-06-02 NOTE — Telephone Encounter (Signed)
Patient's ultrasound will need to incorporate her left supraclavicular region.

## 2019-06-02 NOTE — Telephone Encounter (Signed)
Order placed for Thyroid US at [facility].  

## 2019-06-02 NOTE — Telephone Encounter (Signed)
Kelli Hale from [facility] returned call, advised patients appt has been changed to 9/28 at [facility] to contact patient to notify.

## 2019-06-02 NOTE — Telephone Encounter (Signed)
Please assist in scheduling a neck and thyroid ultrasound.   Patient has a hx of an enlarged thyroid.  She has left supraclavicular swelling today.   You will need to assistance of a Spanish interpretor for this.  The patient is currently in the office.

## 2019-06-03 LAB — CBC
Hematocrit: 42.8 % (ref 34.0–46.6)
Hemoglobin: 13.8 g/dL (ref 11.1–15.9)
MCH: 26.7 pg (ref 26.6–33.0)
MCHC: 32.2 g/dL (ref 31.5–35.7)
MCV: 83 fL (ref 79–97)
Platelets: 285 10*3/uL (ref 150–450)
RBC: 5.17 x10E6/uL (ref 3.77–5.28)
RDW: 13 % (ref 11.7–15.4)
WBC: 5.4 10*3/uL (ref 3.4–10.8)

## 2019-06-03 LAB — COMPREHENSIVE METABOLIC PANEL
ALT: 17 IU/L (ref 0–32)
AST: 18 IU/L (ref 0–40)
Albumin/Globulin Ratio: 1.6 (ref 1.2–2.2)
Albumin: 4.5 g/dL (ref 3.8–4.8)
Alkaline Phosphatase: 100 IU/L (ref 39–117)
BUN/Creatinine Ratio: 24 — ABNORMAL HIGH (ref 9–23)
BUN: 14 mg/dL (ref 6–24)
Bilirubin Total: 0.4 mg/dL (ref 0.0–1.2)
CO2: 24 mmol/L (ref 20–29)
Calcium: 9 mg/dL (ref 8.7–10.2)
Chloride: 102 mmol/L (ref 96–106)
Creatinine, Ser: 0.59 mg/dL (ref 0.57–1.00)
GFR calc Af Amer: 126 mL/min/{1.73_m2} (ref >59)
GFR calc non Af Amer: 109 mL/min/{1.73_m2} (ref >59)
Globulin, Total: 2.8 g/dL (ref 1.5–4.5)
Glucose: 89 mg/dL (ref 65–99)
Potassium: 4.2 mmol/L (ref 3.5–5.2)
Sodium: 139 mmol/L (ref 134–144)
Total Protein: 7.3 g/dL (ref 6.0–8.5)

## 2019-06-03 LAB — IRON: Iron: 112 ug/dL (ref 27–159)

## 2019-06-03 LAB — LIPID PANEL
Chol/HDL Ratio: 4.2 ratio (ref 0.0–4.4)
Cholesterol, Total: 250 mg/dL — ABNORMAL HIGH (ref 100–199)
HDL: 60 mg/dL (ref >39)
LDL Chol Calc (NIH): 166 mg/dL — ABNORMAL HIGH (ref 0–99)
Triglycerides: 134 mg/dL (ref 0–149)
VLDL Cholesterol Cal: 24 mg/dL (ref 5–40)

## 2019-06-03 LAB — FERRITIN: Ferritin: 23 ng/mL (ref 15–150)

## 2019-06-03 LAB — T3, FREE: T3, Free: 3.3 pg/mL (ref 2.0–4.4)

## 2019-06-03 LAB — T4, FREE: Free T4: 1.44 ng/dL (ref 0.82–1.77)

## 2019-06-03 LAB — TSH: TSH: 1.11 u[IU]/mL (ref 0.450–4.500)

## 2019-06-03 NOTE — Telephone Encounter (Signed)
Call placed to patient to review benefit and scheduled recommended ultrasound. Patient turned call over to her daughter, to interpret. Patient, via interpreter acknowledged understanding of information presented. Patient is scheduled 06/10/2019 with Dr Quincy Simmonds. Patient is aware of appointment date, arrival time and cancellation policy. No further questions. Will close encounter

## 2019-06-03 NOTE — Telephone Encounter (Signed)
Spoke with patient using [place] Interpretor ID# [id number]. Confirmed patients appt date, time and location with patient. Patient verbalizes understanding and is agreeable.   Patient placed in [facility] hold.   Routing to provider for final review. Patient is agreeable to disposition. Will close encounter.

## 2019-06-05 ENCOUNTER — Encounter: Payer: Self-pay | Admitting: Obstetrics and Gynecology

## 2019-06-07 ENCOUNTER — Other Ambulatory Visit: Payer: BLUE CROSS/BLUE SHIELD

## 2019-06-07 ENCOUNTER — Ambulatory Visit
Admission: RE | Admit: 2019-06-07 | Discharge: 2019-06-07 | Disposition: A | Discharge status: Home / Self Care | Payer: BLUE CROSS/BLUE SHIELD | Source: Ambulatory Visit | Attending: Obstetrics and Gynecology | Admitting: Obstetrics and Gynecology

## 2019-06-07 DIAGNOSIS — R590 Localized enlarged lymph nodes: Secondary | ICD-10-CM

## 2019-06-07 DIAGNOSIS — E041 Nontoxic single thyroid nodule: Secondary | ICD-10-CM | POA: Diagnosis not present

## 2019-06-07 DIAGNOSIS — E039 Hypothyroidism, unspecified: Secondary | ICD-10-CM | POA: Diagnosis not present

## 2019-06-09 ENCOUNTER — Other Ambulatory Visit: Payer: Self-pay | Admitting: *Deleted

## 2019-06-09 MED ORDER — LEVOTHYROXINE SODIUM 100 MCG PO TABS: 100.0000 ug | ORAL_TABLET | Freq: Every day | ORAL | 3 refills | Status: DC

## 2019-06-10 ENCOUNTER — Other Ambulatory Visit: Payer: Self-pay

## 2019-06-10 ENCOUNTER — Encounter: Payer: Self-pay | Admitting: Obstetrics and Gynecology

## 2019-06-10 ENCOUNTER — Ambulatory Visit (INDEPENDENT_AMBULATORY_CARE_PROVIDER_SITE_OTHER): Payer: BC Managed Care – PPO

## 2019-06-10 ENCOUNTER — Ambulatory Visit (INDEPENDENT_AMBULATORY_CARE_PROVIDER_SITE_OTHER): Payer: BC Managed Care – PPO | Admitting: Obstetrics and Gynecology

## 2019-06-10 VITALS — BP 140/82 | HR 60 | Temp 97.6°F | Ht 64.5 in | Wt 166.4 lb

## 2019-06-10 DIAGNOSIS — R9389 Abnormal findings on diagnostic imaging of other specified body structures: Secondary | ICD-10-CM

## 2019-06-10 DIAGNOSIS — N852 Hypertrophy of uterus: Secondary | ICD-10-CM | POA: Diagnosis not present

## 2019-06-10 DIAGNOSIS — D251 Intramural leiomyoma of uterus: Secondary | ICD-10-CM | POA: Diagnosis not present

## 2019-06-10 DIAGNOSIS — D219 Benign neoplasm of connective and other soft tissue, unspecified: Secondary | ICD-10-CM

## 2019-06-10 NOTE — Patient Instructions (Signed)
Biopsia de endometrio Endometrial Biopsy  La biopsia de endometrio es un procedimiento en el que se toma una [place] de tejido del tero. La muestra se toma del endometrio, que es el revestimiento del tero. A continuacin, la muestra de tejido se revisa en el microscopio para ver si el tejido es normal o anormal. Este procedimiento ayuda a determinar si est en el ciclo menstrual y de qu modo los niveles de hormonas afectan el revestimiento del tero. Este procedimiento tambin se [place] para evaluar el sangrado uterino o para Electrical engineer de endometrio, tuberculosis endometrial, plipos u otras enfermedades inflamatorias. Informe al mdico acerca de lo siguiente:  Cualquier alergia que tenga.  Todos los [employer], incluidos vitaminas, hierbas, gotas oftlmicas, cremas y medicamentos de venta libre.  Cualquier problema previo que usted o los miembros de su familia hayan tenido con anestsicos.  Cualquier enfermedad de la sangre que tenga.  Cirugas previas a las que se someti.  Cualquier enfermedad que tenga.  Si est embarazada o podra estarlo. Cules son los riesgos? En general, se trata de un procedimiento seguro. Sin embargo, pueden ocurrir complicaciones, por ejemplo:  Hemorragia.  Infecciones plvicas.  Puncin en la pared del tero con el dispositivo utilizado para tomar la biopsia (raro). Qu ocurre antes del procedimiento?  Lleve un registro de sus ciclos menstruales segn se lo haya indicado su mdico. Puede ser necesario que programe el procedimiento para un momento especfico del ciclo menstrual.  Puede llevar un apsito sanitario para usar despus del procedimiento.  Consulte al mdico si debe hacer o no lo siguiente: ? Cambiar o suspender los medicamentos que toma habitualmente. Esto es muy importante si toma medicamentos para la diabetes o anticoagulantes. ? Tomar medicamentos como aspirina e ibuprofeno. Estos medicamentos pueden tener un  efecto anticoagulante en la [place]. No tome estos medicamentos antes del procedimiento si su mdico le indica que no lo haga.  Haga planes para que una persona lo lleve a su casa desde el hospital o la clnica. Qu ocurre durante el procedimiento?  Para reducir el riesgo de infecciones: ? El equipo mdico se lavar o se desinfectar las manos.  Deber recostarse en una camilla con los pies y las piernas elevados, como en el examen plvico.  El mdico insertar un instrumento (espculo) en la vagina para observar el cuello del tero.  El cuello del tero ser desinfectado con una solucin antisptica.  Para adormecer el cuello del tero, le aplicarn un medicamento (anestsico local).  Se utilizar un frceps (tenculo) para Contractor cuello del tero firme.  Se insertar un instrumento delgado, similar a una varilla (sonda uterina) a travs del cuello del tero para determinar su longitud y TEFL teacher de donde se tomar la muestra para la biopsia.  A continuacin, se pasa un tubo delgado y flexible (catter) a travs del cuello del tero hasta el tero. El catter se Risk manager para Barista de tejido del endometrio para la biopsia.  Se retirarn el catter y [place], y la [place] se enviar al laboratorio para ser examinada. Qu sucede despus del procedimiento?  Descansar en una sala de recuperacin hasta que est lista para volver a su casa.  Sentir clicos leves y tendr una pequea cantidad de sangrado vaginal. Esto es normal.  Es su responsabilidad retirar los [place] del procedimiento. Pregntele al mdico o a alguien del departamento donde se realice el procedimiento cundo estarn [employer]. Resumen  La biopsia de endometrio es un procedimiento en  el que se toma [place] de tejido del endometrio, que es el revestimiento del tero.  Este procedimiento puede ayudar a Retail buyer problemas del ciclo menstrual, sangrado anormal u otras  afecciones que afectan el endometrio.  Antes del procedimiento, lleve un registro de sus ciclos menstruales segn se lo haya indicado su mdico.  La muestra de tejido extrada se revisar en el microscopio para ver si el tejido es normal o anormal. Esta informacin no tiene Marine scientist el consejo del mdico. Asegrese de hacerle al mdico cualquier pregunta que tenga. Document Released: 04/28/2013 Document Revised: 04/21/2017 Document Reviewed: 04/21/2017 Elsevier Patient Education  [place] uterinos Uterine Fibroids  Los fibromas uterinos (liomiomas) son tumores no cancerosos (benignos) que pueden Therapist, art. Los fibromas tambin pueden crecer en las trompas de [place], el cuello uterino o los tejidos (ligamentos) cerca del tero. Puede tener uno o muchos fibromas. Los fibromas tienen diferentes tamaos y pesos, y crecen en distintas partes del tero. Algunos pueden crecer hasta volverse bastante grandes. La mayora de los fibromas no requiere tratamiento mdico. Cules son las causas? Se desconoce la causa de esta afeccin. Qu incrementa el riesgo? Es ms probable que tenga esta afeccin si:  Tiene entre 30 y 48 aos de edad y no ha [employer].  Tiene antecedentes familiares de esta afeccin.  Tiene ascendencia afroamericana.  Tuvo su primer menstruacin a una edad temprana (menarquia prematura).  No ha tenido ningn hijo (nuliparidad).  Tiene sobrepeso u obesidad. Cules son los signos o los sntomas? Muchas mujeres no tienen sntomas. Los sntomas de esta afeccin pueden incluir los siguientes:  Hemorragias menstruales abundantes.  Prdidas de [place] o [employer] perodos [place].  Dolor y presin en la zona plvica, entre las caderas.  Problemas en la vejiga, como necesidad imperiosa de orinar u orinar con ms frecuencia que lo habitual.  Incapacidad para tener hijos  (infertilidad).  Imposibilidad de llevar un embarazo a trmino (aborto espontneo). Cmo se diagnostica? Esta afeccin se puede diagnosticar en funcin de lo siguiente:  Los sntomas y antecedentes mdicos.  Un examen fsico.  Un examen plvico que incluye la palpacin para detectar tumores.  Estudios de diagnstico por imgenes, como una ecografa o una resonancia magntica (RM). Cmo se trata? El tratamiento de esta afeccin puede incluir lo siguiente:  Ver al [employer] en visitas de seguimiento para controlar si hubo cambios en los fibromas.  Tomar antiinflamatorios no esteroideos (AINE), como ibuprofeno, naproxeno o aspirina para Best boy.  Medicamentos hormonales. Pueden tomarse en forma de comprimidos, aplicarse mediante inyecciones o administrarse a travs de un dispositivo con forma de T que se coloca en el tero (dispositivo intrauterino o DIU).  Ciruga para extirpar: ? Los fibromas (miomectoma). El mdico puede recomendarle esta opcin si los fibromas afectan su fertilidad y tiene deseos de [place]. ? El tero (histerectoma). ? La irrigacin sangunea a los fibromas (embolizacin de la arteria uterina). Siga estas indicaciones en su casa:  Tome los medicamentos de venta libre y los recetados solamente como se lo haya indicado el mdico.  Consulte a su mdico si debe tomar comprimidos de hierro o comer ms alimentos ricos en hierro, como verduras de hojas de color verde oscuro. El sangrado menstrual excesivo pueden causar niveles bajos de hierro.  Si se lo indican, aplique calor en la espalda o el abdomen para Best boy. Use la fuente de calor que el mdico le recomiende, como una compresa de calor hmedo o  una almohadilla trmica. ? Coloque una Gen[employer] piel y la fuente de Freight forwarder. ? Aplique el calor durante 20 a 57minutos. ? Retire la fuente de calor si la piel se pone de color rojo brillante. Esto es muy importante si no puede Tree surgeon, calor o fro. Puede correr un riesgo mayor de sufrir quemaduras.  Preste mucha atencin a su ciclo menstrual. Informe al mdico si nota algn cambio, por ejemplo: ? Un aumento del flujo de sangre que le exige el uso de ms apsitos o tampones que los que utiliza habitualmente. ? Un cambio en el nmero de [employer] dura la [place]. ? Un cambio en los sntomas asociados con la [place], como dolor de espalda o clicos abdominales.  Concurra a todas las visitas de seguimiento como se lo haya indicado el mdico. Esto es importante, especialmente si los fibromas deben controlarse para Actuary cambio. Comunquese con un mdico si:  Tiene dolor plvico, dolor de espalda o clicos abdominales que no se alivian con los medicamentos o al Presenter, broadcasting.  Presenta nuevo sangrado entre los [employer].  Observa un aumento del sangrado durante y [employer] perodos [place].  Se siente inusualmente cansada o dbil.  Tiene sensacin de desvanecimiento. Solicite ayuda inmediatamente si:  Se desmaya.  Tiene un dolor plvico que empeora sbitamente.  Tiene sangrado vaginal intenso que empapa un tampn o un apsito en 30 minutos o menos. Resumen  Los fibromas uterinos son tumores no cancerosos (benignos) que pueden Therapist, art.  Se desconoce la causa exacta de esta afeccin.  La mayora de los fibromas no necesitan tratamiento mdico, a menos que afecten su capacidad para tener hijos (fertilidad).  Comunquese con el mdico si tiene dolor plvico, dolor de espalda o clicos abdominales que no se alivian con medicamentos.  Asegrese de saber por cules sntomas debera obtener ayuda de inmediato. Esta informacin no tiene Marine scientist el consejo del mdico. Asegrese de hacerle al mdico cualquier pregunta que tenga. Document Released: 08/26/2005 Document Revised: 12/06/2017 Document Reviewed: 10/07/2017 Elsevier Patient Education  2020  [employer].

## 2019-06-10 NOTE — Progress Notes (Signed)
Encounter reviewed by Dr. Brook Amundson C. Silva.  

## 2019-06-10 NOTE — Progress Notes (Signed)
GYNECOLOGY  VISIT   HPI: 48 y.o.   Married  Hispanic  female   [phone] with Patient's last menstrual period was 05/17/2019.   here for pelvic ultrasound.    Spanish interpretor present for the entire visit today.   She was seen for her routine exam and was noted to have an enlarged uterus, 10 - 11 week size and globular.  She has symptoms on urinary urgency and frequency.  Menses every 21 - 28 days.   She has left supraclavicular swelling and had a thyroid US showing two small nodules which do not require biopsy.  Her TFTs are normal on Synthroid.   She has left arm pain when she raises her arm.   She is asking about taking Omega 3 fatty acids for her elevated cholesterol.   GYNECOLOGIC HISTORY: Patient's last menstrual period was 05/17/2019. Contraception: Tubal Menopausal hormone therapy:  none Last mammogram: 06/23/18 BIRADS 1 negative/density c Last pap smear: 05/27/18 Neg:Neg HR HPV        OB History    Gravida  3   Para  2   Term  2   Preterm  0   AB  0   Living  3     SAB  0   TAB  0   Ectopic  0   Multiple  0   Live Births  3              Patient Active Problem List   Diagnosis Date Noted  . Hypothyroidism 06/28/2018  . Fibroid 06/04/2018  . Upper abdominal pain 01/02/2018  . Lower urinary tract symptoms (LUTS) 06/28/2017  . Acute UTI 06/28/2017    Past Medical History:  Diagnosis Date  . Allergy   . Anemia   . Constipation    occasionally- but daily or weekly   . Elevated cholesterol   . Fibroid   . Hypothyroidism 2019  . Hypothyroidism 2019  . Thyroid disease 2005    Past Surgical History:  Procedure Laterality Date  . CESAREAN SECTION     x 1  . COLPOSCOPY    . DILATATION & CURETTAGE/HYSTEROSCOPY WITH MYOSURE N/A 07/28/2018   Procedure: DILATATION & CURETTAGE/HYSTEROSCOPY WITH MYOSURE;  Surgeon: Nunzio Cobbs, MD;  Location: [facility];  Service: Gynecology;  Laterality: N/A;  rep will be here confirmed on 07/27/18   . TUBAL LIGATION    . UPPER GASTROINTESTINAL ENDOSCOPY  01/16/2018    Current Outpatient Medications  Medication Sig Dispense Refill  . ferrous sulfate (FERROUSUL) 325 (65 FE) MG tablet Take 1 tablet (325 mg total) by mouth 2 (two) times daily with a meal. 30 tablet 3  . levothyroxine (SYNTHROID) 100 MCG tablet Take 1 tablet (100 mcg total) by mouth daily before breakfast. 90 tablet 3  . Multiple Vitamin (MULTIVITAMIN) tablet Take 1 tablet by mouth daily.     No current facility-administered medications for this visit.      ALLERGIES: Patient has no known allergies.  Family History  Problem Relation Age of Onset  . Heart disease Mother   . Rectal cancer Neg Hx   . Stomach cancer Neg Hx   . Colon cancer Neg Hx   . Esophageal cancer Neg Hx   . Colon polyps Neg Hx   . Breast cancer Neg Hx     Social History   Socioeconomic History  . Marital status: Married    Spouse name: Not on file  . Number of children: 3  . Years  of education: Not on file  . Highest education level: Not on file  Occupational History  . Not on file  Social Needs  . Financial resource strain: Not on file  . Food insecurity    Worry: Not on file    Inability: Not on file  . Transportation needs    Medical: Not on file    Non-medical: Not on file  Tobacco Use  . Smoking status: Never Smoker  . Smokeless tobacco: Never Used  Substance and Sexual Activity  . Alcohol use: No  . Drug use: No  . Sexual activity: Yes    Birth control/protection: Surgical  Lifestyle  . Physical activity    Days per week: Not on file    Minutes per session: Not on file  . Stress: Not on file  Relationships  . Social Herbalist on phone: Not on file    Gets together: Not on file    Attends religious service: Not on file    Active member of club or organization: Not on file    Attends meetings of clubs or organizations: Not on file    Relationship status: Not on file  . Intimate partner violence     Fear of current or ex partner: Not on file    Emotionally abused: Not on file    Physically abused: Not on file    Forced sexual activity: Not on file  Other Topics Concern  . Not on file  Social History Narrative  . Not on file    Review of Systems  All other systems reviewed and are negative.   PHYSICAL EXAMINATION:    BP 140/82   Pulse 60   Temp 97.6 F (36.4 C) (Temporal)   Ht 5' 4.5" (1.638 m)   Wt 166 lb 6.4 oz (75.5 kg)   LMP 05/17/2019   BMI 28.12 kg/m     General appearance: alert, cooperative and appears stated age   Pelvic US today Uterus with 2 fibroids:  1.9 cm and 6.47 cm with cystic center and septum(was 37 x 29 mm normal appearing fibroid in March 07, 2028).  EMS 18.49 mm Ovaries normal.  No free fluid.  ASSESSMENT  Left supraclavicular swelling. Lymph node?  Schwannoma?  Thyroid nodules on [place].  No follow up needed. Elevated cholesterol. Fibroids. One large one with cystic change.  Thickened endometrium.  PLAN  We discussed thickened endometrium and uterine fibroids.  Will do pelvic MRI.  Return for endometrial biopsy.  Procedure explained.  She will follow up with her PCP regarding her cholesterol and left supraclavicular swelling and arm pain.  An After Visit Summary was printed and given to the patient.  __15____ minutes face to face time of which over 50% was spent in counseling.

## 2019-06-15 ENCOUNTER — Telehealth: Payer: Self-pay | Admitting: Obstetrics and Gynecology

## 2019-06-15 NOTE — Telephone Encounter (Signed)
Patient returned call and I conveyed benefits. Patient understands/agreeable with benefits.

## 2019-06-15 NOTE — Telephone Encounter (Signed)
Call placed to convey benefits for endometrial biopsy. 

## 2019-06-16 ENCOUNTER — Ambulatory Visit: Payer: BC Managed Care – PPO | Admitting: Emergency Medicine

## 2019-06-17 ENCOUNTER — Telehealth: Payer: Self-pay | Admitting: Obstetrics and Gynecology

## 2019-06-17 NOTE — Telephone Encounter (Signed)
Returned call to patients daughter [name], she is listed on [place]. Patient is scheduled for endometrial biopsy on 06/18/2019. Patient is aware of the appointment date, arrival time and cancellation fee. No further quesitons. Will close encounter

## 2019-06-17 NOTE — Telephone Encounter (Signed)
See previous phone notes. Patients daughter, [name], (she is listed on DPR) called back stating patient started her period on 06/16/2019. Ivette states cycle last 7 days. Reviewed with Nurse Supervisor. Will need to rescheduled. Offered appointment date, 06/24/2019, which was declined, stating patient has other appointments on that date. Advised to called back to office Triage Nurse when patient stops bleeding to schedule. Ivette acknowledges understanding of information and will call back when patients cycle has stopped.   Forwarding to Dr Quincy Simmonds for final review. Ivette, is agreeable to disposition. Will close encounter.   cc: Lamont Snowball, RN         Kelli Hale

## 2019-06-17 NOTE — Telephone Encounter (Signed)
Patient's daughter [name] (on dpr) calling to schedule appointment. Her number is [phone]

## 2019-06-18 ENCOUNTER — Ambulatory Visit: Payer: BC Managed Care – PPO | Admitting: Obstetrics and Gynecology

## 2019-06-18 ENCOUNTER — Telehealth: Payer: Self-pay | Admitting: Obstetrics and Gynecology

## 2019-06-18 NOTE — Telephone Encounter (Signed)
Please schedule EMB with me for 06/21/19 if patient is available.  She has an upcoming MRI, and I would like to get this completed.  Cc- Lerry Liner

## 2019-06-18 NOTE — Telephone Encounter (Signed)
Spoke with patient using [facility] ID# [id number]. Tubal ligation for contraceptive. LMP 06/17/19. Cycle typically last 4 days. EMB scheduled for 10/12 at 4pm with Dr. Quincy Simmonds. Covid 19 prescreen negative, precautions reviewed.   Advised patient I would update Dr. Quincy Simmonds, our office will return call if any additional recommendations. Patient verbalizes understanding and is agreeable.   Routing to provider for final review. Patient is agreeable to disposition. Will close encounter.   Cc: Lerry Liner, Magdalene Patricia

## 2019-06-21 ENCOUNTER — Other Ambulatory Visit: Payer: Self-pay

## 2019-06-21 ENCOUNTER — Ambulatory Visit (INDEPENDENT_AMBULATORY_CARE_PROVIDER_SITE_OTHER): Payer: BC Managed Care – PPO | Admitting: Obstetrics and Gynecology

## 2019-06-21 ENCOUNTER — Other Ambulatory Visit: Payer: Self-pay | Admitting: Obstetrics and Gynecology

## 2019-06-21 ENCOUNTER — Encounter: Payer: Self-pay | Admitting: Obstetrics and Gynecology

## 2019-06-21 DIAGNOSIS — N840 Polyp of corpus uteri: Secondary | ICD-10-CM | POA: Diagnosis not present

## 2019-06-21 DIAGNOSIS — R9389 Abnormal findings on diagnostic imaging of other specified body structures: Secondary | ICD-10-CM | POA: Diagnosis not present

## 2019-06-21 NOTE — Patient Instructions (Signed)
Endometrial Biopsy, Care After This sheet gives you information about how to care for yourself after your procedure. Your health care provider may also give you more specific instructions. If you have problems or questions, contact your health care provider. What can I expect after the procedure? After the procedure, it is common to have:  Mild cramping.  A small amount of vaginal bleeding for a few days. This is normal. Follow these instructions at home:   Take over-the-counter and prescription medicines only as told by your health care provider.  Do not douche, use tampons, or have sexual intercourse until your health care provider approves.  Return to your normal activities as told by your health care provider. Ask your health care provider what activities are safe for you.  Follow instructions from your health care provider about any activity restrictions, such as restrictions on strenuous exercise or heavy lifting. Contact a health care provider if:  You have heavy bleeding, or bleed for longer than 2 days after the procedure.  You have bad smelling discharge from your vagina.  You have a fever or chills.  You have a burning sensation when urinating or you have difficulty urinating.  You have severe pain in your lower abdomen. Get help right away if:  You have severe cramps in your stomach or back.  You pass large blood clots.  Your bleeding increases.  You become weak or light-headed, or you pass out. Summary  After the procedure, it is common to have mild cramping and a small amount of vaginal bleeding for a few days.  Do not douche, use tampons, or have sexual intercourse until your health care provider approves.  Return to your normal activities as told by your health care provider. Ask your health care provider what activities are safe for you. This information is not intended to replace advice given to you by your health care provider. Make sure you discuss any  questions you have with your health care provider. Document Released: 06/16/2013 Document Revised: 08/08/2017 Document Reviewed: 09/11/2016 Elsevier Patient Education  [place] uterinos Uterine Fibroids  Los fibromas uterinos (liomiomas) son tumores no cancerosos (benignos) que pueden Therapist, art. Los fibromas tambin pueden crecer en las trompas de [place], el cuello uterino o los tejidos (ligamentos) cerca del tero. Puede tener uno o muchos fibromas. Los fibromas tienen diferentes tamaos y pesos, y crecen en distintas partes del tero. Algunos pueden crecer hasta volverse bastante grandes. La mayora de los fibromas no requiere tratamiento mdico. Cules son las causas? Se desconoce la causa de esta afeccin. Qu incrementa el riesgo? Es ms probable que tenga esta afeccin si:  Tiene entre 30 y 62 aos de edad y no ha [employer].  Tiene antecedentes familiares de esta afeccin.  Tiene ascendencia afroamericana.  Tuvo su primer menstruacin a una edad temprana (menarquia prematura).  No ha tenido ningn hijo (nuliparidad).  Tiene sobrepeso u obesidad. Cules son los signos o los sntomas? Muchas mujeres no tienen sntomas. Los sntomas de esta afeccin pueden incluir los siguientes:  Hemorragias menstruales abundantes.  Prdidas de [place] o [employer] perodos [place].  Dolor y presin en la zona plvica, entre las caderas.  Problemas en la vejiga, como necesidad imperiosa de orinar u orinar con ms frecuencia que lo habitual.  Incapacidad para tener hijos (infertilidad).  Imposibilidad de llevar un embarazo a trmino (aborto espontneo). Cmo se diagnostica? Esta afeccin se puede diagnosticar en funcin de lo siguiente:  Los sntomas  y antecedentes mdicos.  Un examen fsico.  Un examen plvico que incluye la palpacin para detectar tumores.  Estudios de diagnstico por imgenes, como una ecografa  o una resonancia magntica (RM). Cmo se trata? El tratamiento de esta afeccin puede incluir lo siguiente:  Ver al [employer] en visitas de seguimiento para controlar si hubo cambios en los fibromas.  Tomar antiinflamatorios no esteroideos (AINE), como ibuprofeno, naproxeno o aspirina para Best boy.  Medicamentos hormonales. Pueden tomarse en forma de comprimidos, aplicarse mediante inyecciones o administrarse a travs de un dispositivo con forma de T que se coloca en el tero (dispositivo intrauterino o DIU).  Ciruga para extirpar: ? Los fibromas (miomectoma). El mdico puede recomendarle esta opcin si los fibromas afectan su fertilidad y tiene deseos de [place]. ? El tero (histerectoma). ? La irrigacin sangunea a los fibromas (embolizacin de la arteria uterina). Siga estas indicaciones en su casa:  Tome los medicamentos de venta libre y los recetados solamente como se lo haya indicado el mdico.  Consulte a su mdico si debe tomar comprimidos de hierro o comer ms alimentos ricos en hierro, como verduras de hojas de color verde oscuro. El sangrado menstrual excesivo pueden causar niveles bajos de hierro.  Si se lo indican, aplique calor en la espalda o el abdomen para Best boy. Use la fuente de calor que el mdico le recomiende, como una compresa de calor hmedo o una almohadilla trmica. ? Coloque una [employer] piel y la fuente de Freight forwarder. ? Aplique el calor durante 20 a 43minutos. ? Retire la fuente de calor si la piel se pone de color rojo brillante. Esto es muy importante si no puede Education officer, environmental, calor o fro. Puede correr un riesgo mayor de sufrir quemaduras.  Preste mucha atencin a su ciclo menstrual. Informe al mdico si nota algn cambio, por ejemplo: ? Un aumento del flujo de sangre que le exige el uso de ms apsitos o tampones que los que utiliza habitualmente. ? Un cambio en el nmero de [employer] dura la [place]. ? Un cambio en  los sntomas asociados con la [place], como dolor de espalda o clicos abdominales.  Concurra a todas las visitas de seguimiento como se lo haya indicado el mdico. Esto es importante, especialmente si los fibromas deben controlarse para Actuary cambio. Comunquese con un mdico si:  Tiene dolor plvico, dolor de espalda o clicos abdominales que no se alivian con los medicamentos o al Presenter, broadcasting.  Presenta nuevo sangrado entre los [employer].  Observa un aumento del sangrado durante y [employer] perodos [place].  Se siente inusualmente cansada o dbil.  Tiene sensacin de desvanecimiento. Solicite ayuda inmediatamente si:  Se desmaya.  Tiene un dolor plvico que empeora sbitamente.  Tiene sangrado vaginal intenso que empapa un tampn o un apsito en 30 minutos o menos. Resumen  Los fibromas uterinos son tumores no cancerosos (benignos) que pueden Therapist, art.  Se desconoce la causa exacta de esta afeccin.  La mayora de los fibromas no necesitan tratamiento mdico, a menos que afecten su capacidad para tener hijos (fertilidad).  Comunquese con el mdico si tiene dolor plvico, dolor de espalda o clicos abdominales que no se alivian con medicamentos.  Asegrese de saber por cules sntomas debera obtener ayuda de inmediato. Esta informacin no tiene Marine scientist el consejo del mdico. Asegrese de hacerle al mdico cualquier pregunta que tenga. Document Released: 08/26/2005 Document Revised: 12/06/2017 Document Reviewed: 10/07/2017 Elsevier Patient  Education  2020 Elsevier Inc.  

## 2019-06-21 NOTE — Progress Notes (Signed)
GYNECOLOGY  VISIT   HPI: 48 y.o.   Married  Hispanic  female   [phone] with Patient's last menstrual period was 06/17/2019 (exact date).   here for endometrial biopsy.    She has thickened endometrium on pelvic US.  She also had a large fibroid with cystic change.   She is scheduled for a pelvic MRI for Oct 22.  GYNECOLOGIC HISTORY: Patient's last menstrual period was 06/17/2019 (exact date). Contraception: tubal  Menopausal hormone therapy:  none Last mammogram: 06/23/18 BIRADS 1 negative/density c Last pap smear: 05/27/18 Neg:Neg HR HPV        OB History    Gravida  3   Para  2   Term  2   Preterm  0   AB  0   Living  3     SAB  0   TAB  0   Ectopic  0   Multiple  0   Live Births  3              Patient Active Problem List   Diagnosis Date Noted  . Hypothyroidism 06/28/2018  . Fibroid 06/04/2018  . Upper abdominal pain 01/02/2018  . Lower urinary tract symptoms (LUTS) 06/28/2017  . Acute UTI 06/28/2017    Past Medical History:  Diagnosis Date  . Allergy   . Anemia   . Constipation    occasionally- but daily or weekly   . Elevated cholesterol   . Fibroid   . Hypothyroidism 2019  . Hypothyroidism 2019  . Thyroid disease 2005    Past Surgical History:  Procedure Laterality Date  . CESAREAN SECTION     x 1  . COLPOSCOPY    . DILATATION & CURETTAGE/HYSTEROSCOPY WITH MYOSURE N/A 07/28/2018   Procedure: DILATATION & CURETTAGE/HYSTEROSCOPY WITH MYOSURE;  Surgeon: Nunzio Cobbs, MD;  Location: [facility];  Service: Gynecology;  Laterality: N/A;  rep will be here confirmed on 07/27/18  . TUBAL LIGATION    . UPPER GASTROINTESTINAL ENDOSCOPY  01/16/2018    Current Outpatient Medications  Medication Sig Dispense Refill  . ferrous sulfate (FERROUSUL) 325 (65 FE) MG tablet Take 1 tablet (325 mg total) by mouth 2 (two) times daily with a meal. 30 tablet 3  . levothyroxine (SYNTHROID) 100 MCG tablet Take 1 tablet (100 mcg total) by mouth  daily before breakfast. 90 tablet 3  . Multiple Vitamin (MULTIVITAMIN) tablet Take 1 tablet by mouth daily.     No current facility-administered medications for this visit.      ALLERGIES: Patient has no known allergies.  Family History  Problem Relation Age of Onset  . Heart disease Mother   . Rectal cancer Neg Hx   . Stomach cancer Neg Hx   . Colon cancer Neg Hx   . Esophageal cancer Neg Hx   . Colon polyps Neg Hx   . Breast cancer Neg Hx     Social History   Socioeconomic History  . Marital status: Married    Spouse name: Not on file  . Number of children: 3  . Years of education: Not on file  . Highest education level: Not on file  Occupational History  . Not on file  Social Needs  . Financial resource strain: Not on file  . Food insecurity    Worry: Not on file    Inability: Not on file  . Transportation needs    Medical: Not on file    Non-medical: Not on file  Tobacco  Use  . Smoking status: Never Smoker  . Smokeless tobacco: Never Used  Substance and Sexual Activity  . Alcohol use: No  . Drug use: No  . Sexual activity: Yes    Birth control/protection: Surgical  Lifestyle  . Physical activity    Days per week: Not on file    Minutes per session: Not on file  . Stress: Not on file  Relationships  . Social Herbalist on phone: Not on file    Gets together: Not on file    Attends religious service: Not on file    Active member of club or organization: Not on file    Attends meetings of clubs or organizations: Not on file    Relationship status: Not on file  . Intimate partner violence    Fear of current or ex partner: Not on file    Emotionally abused: Not on file    Physically abused: Not on file    Forced sexual activity: Not on file  Other Topics Concern  . Not on file  Social History Narrative  . Not on file    Review of Systems  All other systems reviewed and are negative.   PHYSICAL EXAMINATION:    BP 122/76   Pulse 76    Temp 97.6 F (36.4 C) (Temporal)   Ht 5' 4.5" (1.638 m)   Wt 165 lb 12.8 oz (75.2 kg)   LMP 06/17/2019 (Exact Date)   BMI 28.02 kg/m     General appearance: alert, cooperative and appears stated age   EMB: Consent for procedure. Sterile prep with   Paracervical block with 1% lidocaine 6 cc, lot number    [id number], expiration April 2022 Tenaculum to anterior cervical lip. Pipelle passed to    10      cm twice.   Tissue to pathology.  Minimal EBL. No complications.   Chaperone was present for exam.  ASSESSMENT  Thickened endometrium.  Large uterine fibroid with cystic change.   PLAN  FU EMB.  Instructions/precautions given. Pelvic MRI on Oct 22. Final plan to follow.    An After Visit Summary was printed and given to the patient.

## 2019-06-23 ENCOUNTER — Ambulatory Visit (INDEPENDENT_AMBULATORY_CARE_PROVIDER_SITE_OTHER): Payer: BC Managed Care – PPO | Admitting: Emergency Medicine

## 2019-06-23 ENCOUNTER — Other Ambulatory Visit: Payer: Self-pay

## 2019-06-23 ENCOUNTER — Encounter: Payer: Self-pay | Admitting: Emergency Medicine

## 2019-06-23 VITALS — BP 134/82 | HR 48 | Temp 97.9°F | Ht 64.0 in | Wt 166.4 lb

## 2019-06-23 DIAGNOSIS — M62838 Other muscle spasm: Secondary | ICD-10-CM | POA: Diagnosis not present

## 2019-06-23 DIAGNOSIS — M7918 Myalgia, other site: Secondary | ICD-10-CM

## 2019-06-23 DIAGNOSIS — Z8639 Personal history of other endocrine, nutritional and metabolic disease: Secondary | ICD-10-CM

## 2019-06-23 DIAGNOSIS — S29012A Strain of muscle and tendon of back wall of thorax, initial encounter: Secondary | ICD-10-CM

## 2019-06-23 MED ORDER — MELOXICAM 7.5 MG PO TABS: 7.5000 mg | ORAL_TABLET | Freq: Every day | ORAL | 0 refills | Status: AC

## 2019-06-23 MED ORDER — CYCLOBENZAPRINE HCL 10 MG PO TABS: 10.0000 mg | ORAL_TABLET | Freq: Every day | ORAL | 0 refills | Status: DC

## 2019-06-23 NOTE — Patient Instructions (Addendum)
If you have lab work done today you will be contacted with your lab results within the next 2 weeks.  If you have not heard from [place] then please contact us. The fastest way to get your results is to register for My Chart.   IF you received an x-ray today, you will receive an invoice from [facility]. Please contact [facility] at [phone] with questions or concerns regarding your invoice.   IF you received labwork today, you will receive an invoice from [facility]. Please contact [facility] at [phone] with questions or concerns regarding your invoice.   Our billing staff will not be able to assist you with questions regarding bills from these companies.  You will be contacted with the lab results as soon as they are available. The fastest way to get your results is to activate your My Chart account. Instructions are located on the last page of this paperwork. If you have not heard from [place] regarding the results in 2 weeks, please contact this office.      Distensin muscular Muscle Strain Una distensin muscular es una lesin que se produce cuando un msculo se estira ms all de su largo normal. Puede ocurrir durante cadas, mientras se practican deportes o se levantan objetos. A causa de una distensin, pueden rasgarse las fibras musculares. En general, la recuperacin de una distensin muscular tarda de 1 a 2semanas. La recuperacin completa normalmente tarda de 5 a 6semanas. Inicialmente, se trata con terapia PRICE (proteccin, reposo, hielo, compresin, elevacin). Esto implica:  Proteger al msculo para que no sufra nuevas lesiones.  Dejar en reposo el msculo lesionado.  Aplicar hielo en el msculo lesionado.  Aplicar presin (compresin) en el msculo lesionado. Esto se puede hacer con una frula o una venda elstica.  Levantar (elevar) el msculo lesionado. Adems, el mdico puede recomendarle analgsicos. Sigue estas instrucciones en tu casa: Si  tiene una frula:  Use la frula como se lo haya indicado el mdico. Quteselo solamente como se lo haya indicado el mdico.  Afloje la frula si los dedos de la mano o de los pies se le adormecen, si siente hormigueo o si se le enfran y se tornan de Optician, dispensing.  Mantenga la frula limpia.  Si la frula no es impermeable: ? No deje que se moje. ? Cbralos con un envoltorio hermtico cuando tome un bao de inmersin o [place]. Control del dolor, el entumecimiento y la hinchazn   Si se lo indican, aplique hielo en el lugar de la lesin. ? Si tiene una frula desmontable, qutesela como se lo haya indicado el mdico. ? Ponga el hielo en una bolsa plstica. ? Coloque una [employer] piel y Therapist, nutritional. ? Coloque el hielo durante 62minutos, 2 a 3veces por da.  Mueva los dedos de las manos o de los pies con Camera operator. Esto ayuda a English as a second language teacher entumecimiento y Air cabin crew.  Cuando est sentado o acostado, mantenga el lugar lesionado por encima del nivel del corazn.  Use una venda elstica segn las indicaciones del mdico. Asegrese de no ajustarla demasiado. Indicaciones generales  [employer] de venta libre y los recetados solamente como se lo haya indicado el mdico.  Limite las [place]. Deje en reposo el msculo lesionado como se lo haya indicado el mdico. El mdico puede decirle que los movimientos suaves no representan un problema.  Si le indicaron fisioterapia, haga los ejercicios como se lo haya indicado el mdico.  No Darl Pikes  presin en ninguna parte de la frula hasta que se haya endurecido por completo. Esto puede tardar muchas horas.  No consuma ningn producto que contenga nicotina o tabaco, como cigarrillos y Psychologist, sport and exercise. Estos pueden retrasar la consolidacin del [place]. Si necesita ayuda para dejar de consumir, consulte al [employer].  Entre en calor antes de hacer ejercicio. Esto ayuda a prevenir futuras distensiones  musculares.  Consulte al mdico cundo puede volver a conducir si tiene una frula.  Concurra a todas las visitas de seguimiento como se lo haya indicado el mdico. Esto es importante. Comunquese con un mdico si:  Siente ms dolor o tiene ms hinchazn en el lugar de la lesin. Solicite ayuda inmediatamente si:  Si tiene alguno de [employer]ant en el lugar de la lesin: ? Siente entumecimiento. ? Siente hormigueos. ? Pierde mucha fuerza. Resumen  Una distensin muscular es una lesin que se produce cuando un msculo se estira ms all de su largo normal.  Inicialmente, se trata con terapia PRICE (proteccin, reposo, hielo, compresin, elevacin). Esto implica proteger la lesin, aplicar hielo y presin, levantar el rea y dejarla en reposo.  Limite las actividades. Deje en reposo el msculo lesionado como se lo haya indicado el mdico. El mdico puede decirle que los movimientos suaves no representan un problema.  Entre en calor antes de hacer ejercicio. Esto ayuda a prevenir futuras distensiones musculares. Esta informacin no tiene Marine scientist el consejo del mdico. Asegrese de hacerle al mdico cualquier pregunta que tenga. Document Released: 11/22/2008 Document Revised: 10/28/2018 Document Reviewed: 10/28/2018 Elsevier Patient Education  2020 [employer].

## 2019-06-23 NOTE — Progress Notes (Signed)
Kelli Hale 47 y.o.   Chief Complaint  Patient presents with  . left side pain    neck and shoulder area 1 month     HISTORY OF PRESENT ILLNESS: This is a 48 y.o. female complaining of pain to left posterior neck area radiating to left upper back shoulder area for the past month.  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  No direct injury but possible overuse. Saw her GYN doctor last month on blood work showed total cholesterol of 250 in a nonfasting blood sample.  CBC and CMP within normal limits.  Also had a thyroid test looked normal.  Has a history of hypothyroidism.  On Synthroid 100 mcg daily. No other significant symptoms.   HPI   Prior to Admission medications   Medication Sig Start Date End Date Taking? Authorizing Provider  ferrous sulfate (FERROUSUL) 325 (65 FE) MG tablet Take 1 tablet (325 mg total) by mouth 2 (two) times daily with a meal. 04/30/18  Yes Armbruster, Kelli Raspberry, MD  levothyroxine (SYNTHROID) 100 MCG tablet Take 1 tablet (100 mcg total) by mouth daily before breakfast. 06/09/19  Yes Kelli Cobbs, MD  Multiple Vitamin (MULTIVITAMIN) tablet Take 1 tablet by mouth daily.   Yes [provider]    No Known Allergies  Patient Active Problem List   Diagnosis Date Noted  . Hypothyroidism 06/28/2018  . Fibroid 06/04/2018    Past Medical History:  Diagnosis Date  . Allergy   . Anemia   . Constipation    occasionally- but daily or weekly   . Elevated cholesterol   . Fibroid   . Hypothyroidism 2019  . Hypothyroidism 2019  . Thyroid disease 2005    Past Surgical History:  Procedure Laterality Date  . CESAREAN SECTION     x 1  . COLPOSCOPY    . DILATATION & CURETTAGE/HYSTEROSCOPY WITH MYOSURE N/A 07/28/2018   Procedure: DILATATION & CURETTAGE/HYSTEROSCOPY WITH MYOSURE;  Surgeon: Kelli Cobbs, MD;  Location: [facility];  Service: Gynecology;  Laterality: N/A;  rep will be here confirmed on  07/27/18  . TUBAL LIGATION    . UPPER GASTROINTESTINAL ENDOSCOPY  01/16/2018    Social History   Socioeconomic History  . Marital status: Married    Spouse name: Not on file  . Number of children: 3  . Years of education: Not on file  . Highest education level: Not on file  Occupational History  . Not on file  Social Needs  . Financial resource strain: Not on file  . Food insecurity    Worry: Not on file    Inability: Not on file  . Transportation needs    Medical: Not on file    Non-medical: Not on file  Tobacco Use  . Smoking status: Never Smoker  . Smokeless tobacco: Never Used  Substance and Sexual Activity  . Alcohol use: No  . Drug use: No  . Sexual activity: Yes    Birth control/protection: Surgical  Lifestyle  . Physical activity    Days per week: Not on file    Minutes per session: Not on file  . Stress: Not on file  Relationships  . Social Herbalist on phone: Not on file    Gets together: Not on file    Attends religious service: Not on file    Active member of club or organization: Not on file    Attends meetings of clubs or  organizations: Not on file    Relationship status: Not on file  . Intimate partner violence    Fear of current or ex partner: Not on file    Emotionally abused: Not on file    Physically abused: Not on file    Forced sexual activity: Not on file  Other Topics Concern  . Not on file  Social History Narrative  . Not on file    Family History  Problem Relation Age of Onset  . Heart disease Mother   . Rectal cancer Neg Hx   . Stomach cancer Neg Hx   . Colon cancer Neg Hx   . Esophageal cancer Neg Hx   . Colon polyps Neg Hx   . Breast cancer Neg Hx      Review of Systems  Constitutional: Negative.  Negative for chills and fever.  HENT: Negative.  Negative for congestion and sore throat.   Respiratory: Negative.  Negative for cough and shortness of breath.   Cardiovascular: Negative.  Negative for chest pain  and palpitations.  Gastrointestinal: Negative.  Negative for abdominal pain, diarrhea, nausea and vomiting.  Genitourinary: Negative.  Negative for dysuria and hematuria.  Musculoskeletal: Positive for back pain and neck pain.  Skin: Negative.  Negative for rash.  Neurological: Negative for dizziness and headaches.  All other systems reviewed and are negative.  Today's Vitals   06/23/19 1010  BP: 134/82  Pulse: (!) 48  Temp: 97.9 F (36.6 C)  TempSrc: Oral  SpO2: 99%  Weight: 166 lb 6.4 oz (75.5 kg)  Height: 5\' 4"  (1.626 m)   Body mass index is 28.56 kg/m.   Physical Exam Vitals signs reviewed.  Constitutional:      Appearance: Normal appearance.  HENT:     Head: Normocephalic.  Eyes:     Extraocular Movements: Extraocular movements intact.     Conjunctiva/sclera: Conjunctivae normal.     Pupils: Pupils are equal, round, and reactive to light.  Neck:     Musculoskeletal: Normal range of motion and neck supple. Muscular tenderness (Left posterior muscle area) present.  Cardiovascular:     Rate and Rhythm: Normal rate and regular rhythm.  Pulmonary:     Effort: Pulmonary effort is normal.     Breath sounds: Normal breath sounds.  Musculoskeletal: Normal range of motion.     Comments: Positive tenderness with muscle spasm to left trapezius area  Skin:    General: Skin is warm and dry.     Capillary Refill: Capillary refill takes less than 2 seconds.  Neurological:     General: No focal deficit present.     Mental Status: She is alert and oriented to person, place, and time.  Psychiatric:        Mood and Affect: Mood normal.        Behavior: Behavior normal.      ASSESSMENT & PLAN: Kelli Hale was seen today for left side pain.  Diagnoses and all orders for this visit:  Upper back strain, initial encounter  Musculoskeletal pain -     meloxicam (MOBIC) 7.5 MG tablet; Take 1 tablet (7.5 mg total) by mouth daily for 7 days.  Muscle spasm -     cyclobenzaprine  (FLEXERIL) 10 MG tablet; Take 1 tablet (10 mg total) by mouth at bedtime.  History of high cholesterol -     Lipid panel; Future    Patient Instructions       If you have lab work done today you will be  contacted with your lab results within the next 2 weeks.  If you have not heard from [place] then please contact us. The fastest way to get your results is to register for My Chart.   IF you received an x-ray today, you will receive an invoice from [facility]. Please contact [facility] at [phone] with questions or concerns regarding your invoice.   IF you received labwork today, you will receive an invoice from [facility]. Please contact [facility] at [phone] with questions or concerns regarding your invoice.   Our billing staff will not be able to assist you with questions regarding bills from these companies.  You will be contacted with the lab results as soon as they are available. The fastest way to get your results is to activate your My Chart account. Instructions are located on the last page of this paperwork. If you have not heard from [place] regarding the results in 2 weeks, please contact this office.      Distensin muscular Muscle Strain Una distensin muscular es una lesin que se produce cuando un msculo se estira ms all de su largo normal. Puede ocurrir durante cadas, mientras se practican deportes o se levantan objetos. A causa de una distensin, pueden rasgarse las fibras musculares. En general, la recuperacin de una distensin muscular tarda de 1 a 2semanas. La recuperacin completa normalmente tarda de 5 a 6semanas. Inicialmente, se trata con terapia PRICE (proteccin, reposo, hielo, compresin, elevacin). Esto implica:  Proteger al msculo para que no sufra nuevas lesiones.  Dejar en reposo el msculo lesionado.  Aplicar hielo en el msculo lesionado.  Aplicar presin (compresin) en el msculo lesionado. Esto se puede hacer con una  frula o una venda elstica.  Levantar (elevar) el msculo lesionado. Adems, el mdico puede recomendarle analgsicos. Sigue estas instrucciones en tu casa: Si tiene una frula:  Use la frula como se lo haya indicado el mdico. Quteselo solamente como se lo haya indicado el mdico.  Afloje la frula si los dedos de la mano o de los pies se le adormecen, si siente hormigueo o si se le enfran y se tornan de Optician, dispensing.  Mantenga la frula limpia.  Si la frula no es impermeable: ? No deje que se moje. ? Cbralos con un envoltorio hermtico cuando tome un bao de inmersin o [place]. Control del dolor, el entumecimiento y la hinchazn   Si se lo indican, aplique hielo en el lugar de la lesin. ? Si tiene una frula desmontable, qutesela como se lo haya indicado el mdico. ? Ponga el hielo en una bolsa plstica. ? Coloque una [employer] piel y Therapist, nutritional. ? Coloque el hielo durante 56minutos, 2 a 3veces por da.  Mueva los dedos de las manos o de los pies con Camera operator. Esto ayuda a English as a second language teacher entumecimiento y Air cabin crew.  Cuando est sentado o acostado, mantenga el lugar lesionado por encima del nivel del corazn.  Use una venda elstica segn las indicaciones del mdico. Asegrese de no ajustarla demasiado. Indicaciones generales  [employer] de venta libre y los recetados solamente como se lo haya indicado el mdico.  Limite las [place]. Deje en reposo el msculo lesionado como se lo haya indicado el mdico. El mdico puede decirle que los movimientos suaves no representan un problema.  Si le indicaron fisioterapia, haga los ejercicios como se lo haya indicado el mdico.  No ejerza presin en ninguna parte de la frula hasta que se haya endurecido por  completo. Esto puede tardar muchas horas.  No consuma ningn producto que contenga nicotina o tabaco, como cigarrillos y Psychologist, sport and exercise. Estos pueden retrasar la consolidacin del  [place]. Si necesita ayuda para dejar de consumir, consulte al [employer].  Entre en calor antes de hacer ejercicio. Esto ayuda a prevenir futuras distensiones musculares.  Consulte al mdico cundo puede volver a conducir si tiene una frula.  Concurra a todas las visitas de seguimiento como se lo haya indicado el mdico. Esto es importante. Comunquese con un mdico si:  Siente ms dolor o tiene ms hinchazn en el lugar de la lesin. Solicite ayuda inmediatamente si:  Si tiene alguno de [employer] en el lugar de la lesin: ? Siente entumecimiento. ? Siente hormigueos. ? Pierde mucha fuerza. Resumen  Una distensin muscular es una lesin que se produce cuando un msculo se estira ms all de su largo normal.  Inicialmente, se trata con terapia PRICE (proteccin, reposo, hielo, compresin, elevacin). Esto implica proteger la lesin, aplicar hielo y presin, levantar el rea y dejarla en reposo.  Limite las actividades. Deje en reposo el msculo lesionado como se lo haya indicado el mdico. El mdico puede decirle que los movimientos suaves no representan un problema.  Entre en calor antes de hacer ejercicio. Esto ayuda a prevenir futuras distensiones musculares. Esta informacin no tiene Marine scientist el consejo del mdico. Asegrese de hacerle al mdico cualquier pregunta que tenga. Document Released: 11/22/2008 Document Revised: 10/28/2018 Document Reviewed: 10/28/2018 Elsevier Patient Education  2020 Elsevier Inc.      Agustina Caroli, MD [facility]

## 2019-06-24 ENCOUNTER — Ambulatory Visit: Payer: BC Managed Care – PPO | Admitting: Obstetrics and Gynecology

## 2019-06-29 ENCOUNTER — Other Ambulatory Visit: Payer: Self-pay

## 2019-06-29 ENCOUNTER — Ambulatory Visit
Admission: RE | Admit: 2019-06-29 | Discharge: 2019-06-29 | Disposition: A | Discharge status: Home / Self Care | Payer: BC Managed Care – PPO | Source: Ambulatory Visit | Attending: Obstetrics and Gynecology | Admitting: Obstetrics and Gynecology

## 2019-06-29 DIAGNOSIS — Z1231 Encounter for screening mammogram for malignant neoplasm of breast: Secondary | ICD-10-CM | POA: Diagnosis not present

## 2019-06-30 ENCOUNTER — Ambulatory Visit (INDEPENDENT_AMBULATORY_CARE_PROVIDER_SITE_OTHER): Payer: BC Managed Care – PPO | Admitting: Emergency Medicine

## 2019-06-30 ENCOUNTER — Other Ambulatory Visit: Payer: Self-pay

## 2019-06-30 DIAGNOSIS — Z8639 Personal history of other endocrine, nutritional and metabolic disease: Secondary | ICD-10-CM

## 2019-07-01 ENCOUNTER — Other Ambulatory Visit: Payer: Self-pay | Admitting: Emergency Medicine

## 2019-07-01 ENCOUNTER — Ambulatory Visit
Admission: RE | Admit: 2019-07-01 | Discharge: 2019-07-01 | Disposition: A | Discharge status: Home / Self Care | Payer: BC Managed Care – PPO | Source: Ambulatory Visit | Attending: Obstetrics and Gynecology | Admitting: Obstetrics and Gynecology

## 2019-07-01 ENCOUNTER — Telehealth: Payer: Self-pay | Admitting: *Deleted

## 2019-07-01 DIAGNOSIS — D251 Intramural leiomyoma of uterus: Secondary | ICD-10-CM

## 2019-07-01 DIAGNOSIS — E785 Hyperlipidemia, unspecified: Secondary | ICD-10-CM

## 2019-07-01 DIAGNOSIS — D252 Subserosal leiomyoma of uterus: Secondary | ICD-10-CM | POA: Diagnosis not present

## 2019-07-01 LAB — LIPID PANEL
Chol/HDL Ratio: 3.8 ratio (ref 0.0–4.4)
Cholesterol, Total: 211 mg/dL — ABNORMAL HIGH (ref 100–199)
HDL: 56 mg/dL (ref >39)
LDL Chol Calc (NIH): 138 mg/dL — ABNORMAL HIGH (ref 0–99)
Triglycerides: 94 mg/dL (ref 0–149)
VLDL Cholesterol Cal: 17 mg/dL (ref 5–40)

## 2019-07-01 MED ORDER — ROSUVASTATIN CALCIUM 20 MG PO TABS: 20.0000 mg | ORAL_TABLET | Freq: Every day | ORAL | 3 refills | Status: DC

## 2019-07-01 MED ORDER — GADOBENATE DIMEGLUMINE 529 MG/ML IV SOLN
15.0000 mL | Freq: Once | INTRAVENOUS | Status: AC | PRN
  Administered 2019-07-01: 15 mL via INTRAVENOUS

## 2019-07-01 NOTE — Telephone Encounter (Signed)
Notes recorded by Burnice Logan, RN on 07/01/2019 at 12:48 PM EDT  Call placed to patient using [facility] ID # [id number]. No answer, no option to leave voicemail.   Continue imaging hold.  ------

## 2019-07-01 NOTE — Telephone Encounter (Signed)
-----   Message from Nunzio Cobbs, MD sent at 06/28/2019  1:13 PM EDT ----- Please contact patient through a Spanish interpretor with results of her EMB showing a benign endometrial polyp.  She has a pelvic MRI this week to evaluate a cystic fibroid of the uterus.  Final plan to follow.   Please keep her in imaging hold.

## 2019-07-06 ENCOUNTER — Other Ambulatory Visit: Payer: Self-pay | Admitting: *Deleted

## 2019-07-06 DIAGNOSIS — D219 Benign neoplasm of connective and other soft tissue, unspecified: Secondary | ICD-10-CM

## 2019-07-06 DIAGNOSIS — R9389 Abnormal findings on diagnostic imaging of other specified body structures: Secondary | ICD-10-CM

## 2019-07-06 NOTE — Telephone Encounter (Signed)
Patient notified of all results. See  ------ 07/01/19 MRI result note.   Encounter closed.

## 2019-07-09 ENCOUNTER — Telehealth: Payer: Self-pay | Admitting: *Deleted

## 2019-07-09 NOTE — Telephone Encounter (Signed)
Called the patient using [employer], left a message for the patient to call the office

## 2019-07-12 ENCOUNTER — Telehealth: Payer: Self-pay | Admitting: *Deleted

## 2019-07-12 NOTE — Telephone Encounter (Signed)
Returned the patient's call through [employer]; patient scheduled for a new patient appt on 11/18. Explained that the mask and no visitor policy. Explained that we will provide an interpreter for the patient

## 2019-07-13 ENCOUNTER — Telehealth: Payer: Self-pay | Admitting: *Deleted

## 2019-07-13 NOTE — Telephone Encounter (Signed)
Patient's daughter called and was giving the appt date/time. Explained to arrive 30 minutes for screening. Also explained the no visitor policy, but that we will provide an interpreter and call her via speaker phone

## 2019-07-28 ENCOUNTER — Other Ambulatory Visit: Payer: Self-pay

## 2019-07-28 ENCOUNTER — Inpatient Hospital Stay: Payer: BC Managed Care – PPO | Admitting: Gynecologic Oncology

## 2019-07-28 ENCOUNTER — Telehealth: Payer: Self-pay | Admitting: *Deleted

## 2019-07-28 NOTE — Telephone Encounter (Signed)
error 

## 2019-08-09 ENCOUNTER — Other Ambulatory Visit: Payer: Self-pay

## 2019-08-09 ENCOUNTER — Inpatient Hospital Stay: Payer: BC Managed Care – PPO | Attending: Gynecologic Oncology | Admitting: Gynecologic Oncology

## 2019-08-09 ENCOUNTER — Encounter: Payer: Self-pay | Admitting: Gynecologic Oncology

## 2019-08-09 ENCOUNTER — Other Ambulatory Visit: Payer: Self-pay | Admitting: Gynecologic Oncology

## 2019-08-09 VITALS — BP 150/94 | HR 62 | Temp 99.1°F | Resp 18 | Ht 64.0 in | Wt 162.0 lb

## 2019-08-09 DIAGNOSIS — D251 Intramural leiomyoma of uterus: Secondary | ICD-10-CM | POA: Diagnosis not present

## 2019-08-09 DIAGNOSIS — Z79899 Other long term (current) drug therapy: Secondary | ICD-10-CM | POA: Insufficient documentation

## 2019-08-09 DIAGNOSIS — K5900 Constipation, unspecified: Secondary | ICD-10-CM | POA: Diagnosis not present

## 2019-08-09 DIAGNOSIS — E7800 Pure hypercholesterolemia, unspecified: Secondary | ICD-10-CM | POA: Insufficient documentation

## 2019-08-09 DIAGNOSIS — E039 Hypothyroidism, unspecified: Secondary | ICD-10-CM | POA: Diagnosis not present

## 2019-08-09 DIAGNOSIS — R102 Pelvic and perineal pain: Secondary | ICD-10-CM | POA: Insufficient documentation

## 2019-08-09 DIAGNOSIS — R350 Frequency of micturition: Secondary | ICD-10-CM | POA: Insufficient documentation

## 2019-08-09 MED ORDER — SENNOSIDES-DOCUSATE SODIUM 8.6-50 MG PO TABS: 2.0000 | ORAL_TABLET | Freq: Every day | ORAL | 1 refills | Status: DC

## 2019-08-09 MED ORDER — OXYCODONE HCL 5 MG PO TABS: 5.0000 mg | ORAL_TABLET | ORAL | 0 refills | Status: DC | PRN

## 2019-08-09 MED ORDER — IBUPROFEN 800 MG PO TABS: 800.0000 mg | ORAL_TABLET | Freq: Three times a day (TID) | ORAL | 1 refills | Status: DC | PRN

## 2019-08-09 NOTE — Patient Instructions (Signed)
Preparing for your Surgery  Plan for surgery on September 30, 2019 with Dr. Everitt Amber at [place] will be scheduled for a robotic assisted total laparoscopic hysterectomy greater than 250 grams, bilateral salpingectomy, minilaparotomy.   Pre-operative Testing -You will receive a phone call from presurgical testing at [facility] if you have not received a call already to arrange for a pre-operative testing appointment before your surgery.  This appointment normally occurs one to two weeks before your scheduled surgery.   -Bring your insurance card, copy of an advanced directive if applicable, medication list  -At that visit, you will be asked to sign a consent for a possible blood transfusion in case a transfusion becomes necessary during surgery.  The need for a blood transfusion is rare but having consent is a necessary part of your care.     -You should not be taking blood thinners or aspirin at least ten days prior to surgery unless instructed by your surgeon.  -Do not take supplements such as fish oil (omega 3), red yeast rice, tumeric before your surgery.  Day Before Surgery at [place] will be asked to take in a light diet the day before surgery.  Avoid carbonated beverages.  You will be advised to have nothing to eat or drink after midnight the evening before.    Eat a light diet the day before surgery.  Examples including soups, broths, toast, yogurt, mashed potatoes.  Things to avoid include carbonated beverages (fizzy beverages), raw fruits and raw vegetables, or beans.   If your bowels are filled with gas, your surgeon will have difficulty visualizing your pelvic organs which increases your surgical risks.  Your role in recovery Your role is to become active as soon as directed by your doctor, while still giving yourself time to heal.  Rest when you feel tired. You will be asked to do the following in order to speed your recovery:  - Cough and breathe  deeply. This helps toclear and expand your lungs and can prevent pneumonia.  - Do mild physical activity. Walking or moving your legs help your circulation and body functions return to normal. A staff member will help you when you try to walk and will provide you with simple exercises. Do not try to get up or walk alone the first time. - Actively manage your pain. Managing your pain lets you move in comfort. We will ask you to rate your pain on a scale of zero to 10. It is your responsibility to tell your doctor or nurse where and how much you hurt so your pain can be treated.  Special Considerations -If you are diabetic, you may be placed on insulin after surgery to have closer control over your blood sugars to promote healing and recovery.  This does not mean that you will be discharged on insulin.  If applicable, your oral antidiabetics will be resumed when you are tolerating a solid diet.  -Your final pathology results from surgery should be available around one week after surgery and the results will be relayed to you when available.  -Dr. Lahoma Crocker is the surgeon that assists your GYN Oncologist with surgery.  If you end up staying the night, the next day after your surgery you will either see Dr. Denman George or Dr. Lahoma Crocker.  -FMLA forms can be faxed to [phone] and please allow 5-7 business days for completion.  Pain Management After Surgery -You have been prescribed your pain medication and bowel  regimen medications before surgery so that you can have these available when you are discharged from the hospital. The pain medication is for use ONLY AFTER surgery and a new prescription will not be given.   -Make sure that you have Tylenol and Ibuprofen at home to use on a regular basis after surgery for pain control. We recommend alternating the medications every hour to six hours since they work differently and are processed in the body differently for pain relief.  -Review  the attached handout on narcotic use and their risks and side effects.   Bowel Regimen -You have been prescribed Sennakot-S to take nightly to prevent constipation especially if you are taking the narcotic pain medication intermittently.  It is important to prevent constipation and drink adequate amounts of liquids.  Blood Transfusion Information WHAT IS A BLOOD TRANSFUSION? A transfusion is the replacement of blood or some of its parts. Blood is made up of multiple cells which provide different functions.  Red blood cells carry oxygen and are used for blood loss replacement.  White blood cells fight against infection.  Platelets control bleeding.  Plasma helps clot blood.  Other blood products are available for specialized needs, such as hemophilia or other clotting disorders. BEFORE THE TRANSFUSION  Who gives blood for transfusions?   You may be able to donate blood to be used at a later date on yourself (autologous donation).  Relatives can be asked to donate blood. This is generally not any safer than if you have received blood from a stranger. The same precautions are taken to ensure safety when a relative's blood is donated.  Healthy volunteers who are fully evaluated to make sure their blood is safe. This is blood bank blood. Transfusion therapy is the safest it has ever been in the practice of medicine. Before blood is taken from a donor, a complete history is taken to make sure that person has no history of diseases nor engages in risky social behavior (examples are intravenous drug use or sexual activity with multiple partners). The donor's travel history is screened to minimize risk of transmitting infections, such as malaria. The donated blood is tested for signs of infectious diseases, such as HIV and hepatitis. The blood is then tested to be sure it is compatible with you in order to minimize the chance of a transfusion reaction. If you or a relative donates blood, this is  often done in anticipation of surgery and is not appropriate for emergency situations. It takes many days to process the donated blood. RISKS AND COMPLICATIONS Although transfusion therapy is very safe and saves many lives, the main dangers of transfusion include:   Getting an infectious disease.  Developing a transfusion reaction. This is an allergic reaction to something in the blood you were given. Every precaution is taken to prevent this. The decision to have a blood transfusion has been considered carefully by your caregiver before blood is given. Blood is not given unless the benefits outweigh the risks.  AFTER SURGERY INSTRUCTIONS 08/09/2019  Return to work: 4-6 weeks if applicable  Activity: 1. Be up and out of the bed during the day.  Take a nap if needed.  You may walk up steps but be careful and use the hand rail.  Stair climbing will tire you more than you think, you may need to stop part way and rest.   2. No lifting or straining for 6 weeks.  3. No driving for 1 week(s).  Do not drive if  you are taking narcotic pain medicine.  4. Shower daily.  Use soap and water on your incision and pat dry; don't rub.  No tub baths until cleared by your surgeon.   5. No sexual activity and nothing in the vagina for 8 weeks.  6. You may experience a small amount of clear drainage from your incisions, which is normal.  If the drainage persists or increases, please call the office.  7. You may experience vaginal spotting after surgery or around the 6-8 week mark from surgery when the stitches at the top of the vagina begin to dissolve.  The spotting is normal but if you experience heavy bleeding, call our office.  8. Take Tylenol or ibuprofen first for pain and only use narcotic pain medication for severe pain not relieved by the Tylenol or Ibuprofen.  Monitor your Tylenol intake to a max of 4,000 mg.  Diet: 1. Low sodium Heart Healthy Diet is recommended.  2. It is safe to use a  laxative, such as Miralax or Colace, if you have difficulty moving your bowels. You can take Sennakot at bedtime every evening to keep bowel movements regular and to prevent constipation.    Wound Care: 1. Keep clean and dry.  Shower daily.  Reasons to call the Doctor:  Fever - Oral temperature greater than 100.4 degrees Fahrenheit  Foul-smelling vaginal discharge  Difficulty urinating  Nausea and vomiting  Increased pain at the site of the incision that is unrelieved with pain medicine.  Difficulty breathing with or without chest pain  New calf pain especially if only on one side  Sudden, continuing increased vaginal bleeding with or without clots.   Contacts: For questions or concerns you should contact:  Dr. Everitt Amber at [phone]  Joylene John, NP at [phone]  After Hours: call [phone] and have the GYN Oncologist paged/contacted

## 2019-08-09 NOTE — Progress Notes (Signed)
Consult Note: Gyn-Onc  Consult was requested by Dr. Quincy Simmonds for the evaluation of Peggysue Federer Avila-Cisneros 48 y.o. female  CC:  Chief Complaint  Patient presents with  . Fibroids    Assessment/Plan:  Ms. GHAIDA ERBES  is a 48 y.o.  year old with a fibroid uterus that is symptomatic with pain, likely secondary to degeneration.   I reviewed the MRI images with the patient and her daughter.  I explained that I have an extremely low suspicion for malignancy given the appearance on MRI, the benign biopsy, and the fact that I believe that the growth in the fibroid has largely been from cystic degeneration and cystic growth rather than solid expansion.  However because the fibroids are symptomatic with pain and mass-effect on the bladder, I am recommending surgical excision with robotic assisted total hysterectomy for uterus greater than 250 g with bilateral salpingectomy.  She may require mini lap for specimen delivery.  I explained risks of the procedure with a translator present including  bleeding, infection, damage to internal organs (such as bladder,ureters, bowels), blood clot, reoperation and rehospitalization. I explained anticipated recovery.  This will be an outpatient procedure.  We will schedule the procedure for late January.   HPI: Ms Armani Gieseking is a 48 year old P3 who is seen in consultation at the request of Dr Quincy Simmonds for evaluation of a fibroid uterus.   The patient was evaluated for routine gynecologic evaluation in October 2020.  She had previously undergone an ultrasound in 2019 which had revealed a uterus containing a 3.6 x 2.9 cm posterior fibroid.  On physical examination the uterus was palpably larger and therefore she underwent an MRI of the pelvis on July 01, 2019.  This revealed a uterus measuring 13.4 x 8.6 x 10.6 cm.  Subserosal fibroid was seen in the right posterior corpus which measured 8.8 x 6.6 x 6.1 cm.  It showed peripheral  contrast-enhancement and a large area of central cystic degeneration.  There were several other small intramural and subserosal fibroids also seen in the upper uterine body and corpus measuring up to 2 cm in size.  The ovaries were normal bilaterally.  An endometrial biopsy was performed on June 21, 2019 which revealed a benign endometrial polyp.  She had undergone a hysteroscopic myomectomy in 2019 which had also revealed benign pathology.  The patient symptoms were significant for urinary frequency due to mass-effect, and intermittent central pelvic pain.  She denies menorrhagia.  She had regular cycles.  Her obstetric history is significant for 2 prior vaginal births and one cesarean section.  She is also had a tubal ligation.  She had no other prior abdominal surgeries.  She has no family history of malignancy.  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  She speaks Spanish only.  Current Meds:  Outpatient Encounter Medications as of 08/09/2019  Medication Sig  . omega-3 acid ethyl esters (LOVAZA) 1 g capsule Take by mouth daily.  . cyclobenzaprine (FLEXERIL) 10 MG tablet Take 1 tablet (10 mg total) by mouth at bedtime.  . ferrous sulfate (FERROUSUL) 325 (65 FE) MG tablet Take 1 tablet (325 mg total) by mouth 2 (two) times daily with a meal.  . levothyroxine (SYNTHROID) 100 MCG tablet Take 1 tablet (100 mcg total) by mouth daily before breakfast.  . Multiple Vitamin (MULTIVITAMIN) tablet Take 1 tablet by mouth daily.  . rosuvastatin (CRESTOR) 20 MG tablet Take 1 tablet (20 mg total) by mouth daily.  No facility-administered encounter medications on file as of 08/09/2019.     Allergy: No Known Allergies  Social Hx:   Social History   Socioeconomic History  . Marital status: Married    Spouse name: Not on file  . Number of children: 3  . Years of education: Not on file  . Highest education level: Not on file  Occupational History  . Not on file  Social Needs   . Financial resource strain: Not on file  . Food insecurity    Worry: Not on file    Inability: Not on file  . Transportation needs    Medical: Not on file    Non-medical: Not on file  Tobacco Use  . Smoking status: Never Smoker  . Smokeless tobacco: Never Used  Substance and Sexual Activity  . Alcohol use: No  . Drug use: No  . Sexual activity: Yes    Birth control/protection: Surgical  Lifestyle  . Physical activity    Days per week: Not on file    Minutes per session: Not on file  . Stress: Not on file  Relationships  . Social Herbalist on phone: Not on file    Gets together: Not on file    Attends religious service: Not on file    Active member of club or organization: Not on file    Attends meetings of clubs or organizations: Not on file    Relationship status: Not on file  . Intimate partner violence    Fear of current or ex partner: Not on file    Emotionally abused: Not on file    Physically abused: Not on file    Forced sexual activity: Not on file  Other Topics Concern  . Not on file  Social History Narrative  . Not on file    Past Surgical Hx:  Past Surgical History:  Procedure Laterality Date  . CESAREAN SECTION     x 1  . COLPOSCOPY    . DILATATION & CURETTAGE/HYSTEROSCOPY WITH MYOSURE N/A 07/28/2018   Procedure: DILATATION & CURETTAGE/HYSTEROSCOPY WITH MYOSURE;  Surgeon: Nunzio Cobbs, MD;  Location: [facility];  Service: Gynecology;  Laterality: N/A;  rep will be here confirmed on 07/27/18  . TUBAL LIGATION    . UPPER GASTROINTESTINAL ENDOSCOPY  01/16/2018    Past Medical Hx:  Past Medical History:  Diagnosis Date  . Allergy   . Anemia   . Constipation    occasionally- but daily or weekly   . Elevated cholesterol   . Fibroid   . Hypothyroidism 2019  . Hypothyroidism 2019  . Thyroid disease 2005    Past Gynecological History:  See HPI No LMP recorded.  Family Hx:  Family History  Problem Relation Age of Onset  .  Heart disease Mother   . Rectal cancer Neg Hx   . Stomach cancer Neg Hx   . Colon cancer Neg Hx   . Esophageal cancer Neg Hx   . Colon polyps Neg Hx   . Breast cancer Neg Hx     Review of Systems:  Constitutional  Feels well,    ENT Normal appearing ears and nares bilaterally Skin/Breast  No rash, sores, jaundice, itching, dryness Cardiovascular  No chest pain, shortness of breath, or edema  Pulmonary  No cough or wheeze.  Gastro Intestinal  No nausea, vomitting, or diarrhoea. No bright red blood per rectum, no abdominal pain, change in bowel movement, or constipation.  Genito Urinary  No frequency, urgency, dysuria, pelvic pain (central)  Musculo Skeletal  No myalgia, arthralgia, joint swelling or pain  Neurologic  No weakness, numbness, change in gait,  Psychology  No depression, anxiety, insomnia.   Vitals:  Blood pressure (!) 150/94, pulse 62, temperature 99.1 F (37.3 C), temperature source Temporal, resp. rate 18, height 5\' 4"  (1.626 m), weight 162 lb (73.5 kg), SpO2 100 %.  Physical Exam: WD in NAD Neck  Supple NROM, without any enlargements.  Lymph Node Survey No cervical supraclavicular or inguinal adenopathy Cardiovascular  Pulse normal rate, regularity and rhythm. S1 and S2 normal.  Lungs  Clear to auscultation bilateraly, without wheezes/crackles/rhonchi. Good air movement.  Skin  No rash/lesions/breakdown  Psychiatry  Alert and oriented to person, place, and time  Abdomen  Normoactive bowel sounds, abdomen soft, non-tender and nonobese without evidence of hernia.  Back No CVA tenderness Genito Urinary  Vulva/vagina: Normal external female genitalia.  No lesions. No discharge or bleeding.  Bladder/urethra:  No lesions or masses, well supported bladder  Vagina: normal  Cervix: Normal appearing, no lesions.  Uterus:  Bulky, mobile, fibroid feels at the fundus.  Adnexa: no palpable masses. Rectal  deferred Extremities  No bilateral cyanosis,  clubbing or edema.   Thereasa Solo, MD  08/09/2019, 9:59 AM

## 2019-08-18 ENCOUNTER — Ambulatory Visit: Payer: BLUE CROSS/BLUE SHIELD | Admitting: Obstetrics and Gynecology

## 2019-08-18 ENCOUNTER — Ambulatory Visit (INDEPENDENT_AMBULATORY_CARE_PROVIDER_SITE_OTHER): Payer: BC Managed Care – PPO | Admitting: Emergency Medicine

## 2019-08-18 ENCOUNTER — Encounter: Payer: Self-pay | Admitting: Emergency Medicine

## 2019-08-18 ENCOUNTER — Other Ambulatory Visit: Payer: Self-pay

## 2019-08-18 VITALS — BP 131/81 | HR 54 | Temp 98.1°F | Resp 16 | Ht 64.0 in | Wt 160.4 lb

## 2019-08-18 DIAGNOSIS — M65312 Trigger thumb, left thumb: Secondary | ICD-10-CM | POA: Diagnosis not present

## 2019-08-18 DIAGNOSIS — E785 Hyperlipidemia, unspecified: Secondary | ICD-10-CM | POA: Diagnosis not present

## 2019-08-18 DIAGNOSIS — M542 Cervicalgia: Secondary | ICD-10-CM

## 2019-08-18 DIAGNOSIS — M5412 Radiculopathy, cervical region: Secondary | ICD-10-CM | POA: Diagnosis not present

## 2019-08-18 NOTE — Progress Notes (Signed)
Kalayla C Avila-Cisneros 48 y.o.   Chief Complaint  Patient presents with  . Hand Pain    LEFT thumb and 3rd finger x 1 month  . Neck Pain    and LEFT shoulder pain - patient was seen 06/23/2019    HISTORY OF PRESENT ILLNESS: This is a 48 y.o. female complaining of pain to left side of her neck for the past several weeks along with pain to left thumb and left middle finger.  Sometimes pain in her neck radiates down the left arm.  Sharp and intermittent.  Patient has physical work cleaning houses.  No other significant symptoms.  HPI   Prior to Admission medications   Medication Sig Start Date End Date Taking? Authorizing Provider  ferrous sulfate (FERROUSUL) 325 (65 FE) MG tablet Take 1 tablet (325 mg total) by mouth 2 (two) times daily with a meal. 04/30/18  Yes Armbruster, [name], MD  ibuprofen (ADVIL) 800 MG tablet Take 1 tablet (800 mg total) by mouth every 8 (eight) hours as needed for moderate pain. For AFTER surgery only 08/09/19  Yes Cross, Melissa D, NP  levothyroxine (SYNTHROID) 100 MCG tablet Take 1 tablet (100 mcg total) by mouth daily before breakfast. 06/09/19  Yes Nunzio Cobbs, MD  Multiple Vitamin (MULTIVITAMIN) tablet Take 1 tablet by mouth daily.   Yes [provider]  omega-3 acid ethyl esters (LOVAZA) 1 g capsule Take by mouth daily.   Yes [provider]  rosuvastatin (CRESTOR) 20 MG tablet Take 1 tablet (20 mg total) by mouth daily. 07/01/19  Yes Dinia Joynt, Ines Bloomer, MD  cyclobenzaprine (FLEXERIL) 10 MG tablet Take 1 tablet (10 mg total) by mouth at bedtime. Patient not taking: Reported on 08/18/2019 06/23/19   Horald Pollen, MD  oxyCODONE (OXY IR/ROXICODONE) 5 MG immediate release tablet Take 1 tablet (5 mg total) by mouth every 4 (four) hours as needed for severe pain. For AFTER surgery only, do not take and drive Patient not taking: Reported on 08/18/2019 08/09/19   Joylene John D, NP  senna-docusate (SENOKOT-S) 8.6-50 MG  tablet Take 2 tablets by mouth at bedtime. For AFTER surgery, do not take if having diarrhea Patient not taking: Reported on 08/18/2019 08/09/19   Dorothyann Gibbs, NP    No Known Allergies  Patient Active Problem List   Diagnosis Date Noted  . Dyslipidemia 08/18/2019  . Fibroids, intramural 08/09/2019  . Hypothyroidism 06/28/2018  . Fibroid 06/04/2018    Past Medical History:  Diagnosis Date  . Allergy   . Anemia   . Constipation    occasionally- but daily or weekly   . Elevated cholesterol   . Fibroid   . Hypothyroidism 2019  . Hypothyroidism 2019  . Thyroid disease 2005    Past Surgical History:  Procedure Laterality Date  . CESAREAN SECTION     x 1  . COLPOSCOPY    . DILATATION & CURETTAGE/HYSTEROSCOPY WITH MYOSURE N/A 07/28/2018   Procedure: DILATATION & CURETTAGE/HYSTEROSCOPY WITH MYOSURE;  Surgeon: Nunzio Cobbs, MD;  Location: [facility];  Service: Gynecology;  Laterality: N/A;  rep will be here confirmed on 07/27/18  . TUBAL LIGATION    . UPPER GASTROINTESTINAL ENDOSCOPY  01/16/2018    Social History   Socioeconomic History  . Marital status: Married    Spouse name: Not on file  . Number of children: 3  . Years of education: Not on file  . Highest education level: Not on file  Occupational  History  . Not on file  Social Needs  . Financial resource strain: Not on file  . Food insecurity    Worry: Not on file    Inability: Not on file  . Transportation needs    Medical: Not on file    Non-medical: Not on file  Tobacco Use  . Smoking status: Never Smoker  . Smokeless tobacco: Never Used  Substance and Sexual Activity  . Alcohol use: No  . Drug use: No  . Sexual activity: Yes    Birth control/protection: Surgical  Lifestyle  . Physical activity    Days per week: Not on file    Minutes per session: Not on file  . Stress: Not on file  Relationships  . Social Herbalist on phone: Not on file    Gets together: Not on file     Attends religious service: Not on file    Active member of club or organization: Not on file    Attends meetings of clubs or organizations: Not on file    Relationship status: Not on file  . Intimate partner violence    Fear of current or ex partner: Not on file    Emotionally abused: Not on file    Physically abused: Not on file    Forced sexual activity: Not on file  Other Topics Concern  . Not on file  Social History Narrative  . Not on file    Family History  Problem Relation Age of Onset  . Heart disease Mother   . Rectal cancer Neg Hx   . Stomach cancer Neg Hx   . Colon cancer Neg Hx   . Esophageal cancer Neg Hx   . Colon polyps Neg Hx   . Breast cancer Neg Hx      Review of Systems  Constitutional: Negative.  Negative for chills and fever.  HENT: Negative.  Negative for congestion and sore throat.   Respiratory: Negative.  Negative for cough and shortness of breath.   Cardiovascular: Negative.  Negative for chest pain and palpitations.  Gastrointestinal: Negative.  Negative for abdominal pain, diarrhea, nausea and vomiting.  Genitourinary: Negative.  Negative for dysuria.  Musculoskeletal: Positive for joint pain and neck pain.  Skin: Negative.  Negative for rash.  Neurological: Negative for dizziness, tingling, sensory change, focal weakness and headaches.  All other systems reviewed and are negative.  Today's Vitals   08/18/19 1003  BP: 131/81  Pulse: (!) 54  Resp: 16  Temp: 98.1 F (36.7 C)  TempSrc: Oral  SpO2: 98%  Weight: 160 lb 6.4 oz (72.8 kg)  Height: 5\' 4"  (1.626 m)   Body mass index is 27.53 kg/m.   Physical Exam Vitals signs reviewed.  Constitutional:      Appearance: Normal appearance.  HENT:     Head: Normocephalic.  Eyes:     Extraocular Movements: Extraocular movements intact.     Pupils: Pupils are equal, round, and reactive to light.  Neck:     Musculoskeletal: Normal range of motion. Normal range of motion. Pain with movement  and muscular tenderness (Paraspinal area) present. No erythema or spinous process tenderness.  Cardiovascular:     Rate and Rhythm: Normal rate and regular rhythm.     Pulses: Normal pulses.     Heart sounds: Normal heart sounds.  Pulmonary:     Effort: Pulmonary effort is normal.     Breath sounds: Normal breath sounds.  Musculoskeletal:     Comments:  Left hand: Positive trigger finger of left thumb.  Neurovascularly intact with full range of motion.  Some mild degenerative arthritic changes noticed.  Middle finger: Neurovascularly intact with full range of motion.  Nontender.  Skin:    General: Skin is warm and dry.     Capillary Refill: Capillary refill takes less than 2 seconds.  Neurological:     General: No focal deficit present.     Mental Status: She is alert and oriented to person, place, and time.  Psychiatric:        Mood and Affect: Mood normal.        Behavior: Behavior normal.      ASSESSMENT & PLAN: Desirree was seen today for hand pain and neck pain.  Diagnoses and all orders for this visit:  Neck pain on left side  Left cervical radiculopathy -     Ambulatory referral to Orthopedic Surgery  Trigger finger of left thumb -     Ambulatory referral to Orthopedic Surgery  Dyslipidemia    Patient Instructions       If you have lab work done today you will be contacted with your lab results within the next 2 weeks.  If you have not heard from [place] then please contact us. The fastest way to get your results is to register for My Chart.   IF you received an x-ray today, you will receive an invoice from [facility]. Please contact [facility] at [phone] with questions or concerns regarding your invoice.   IF you received labwork today, you will receive an invoice from [facility]. Please contact [facility] at [phone] with questions or concerns regarding your invoice.   Our billing staff will not be able to assist you with questions  regarding bills from these companies.  You will be contacted with the lab results as soon as they are available. The fastest way to get your results is to activate your My Chart account. Instructions are located on the last page of this paperwork. If you have not heard from [place] regarding the results in 2 weeks, please contact this office.    We recommend that you schedule a mammogram for breast cancer screening. Typically, you do not need a referral to do this. Please contact a local imaging center to schedule your mammogram.  [facility] - [phone]  *ask for the Radiology Department [facility]) - [phone] or [phone]  [facility] - [phone] [place] [phone] [facility] - [phone]  *ask for the [facility] - [phone]  *ask for the Radiology Department [facility] - [phone]  *ask for the [facility] - [phone] Radiculopata cervical Cervical Radiculopathy  La radiculopata cervical quiere decir que un nervio del cuello (nervio cervical) est comprimido o daado. Esto puede ocurrir debido a una lesin en la columna vertebral cervical (vrtebras) del cuello, o como parte normal del envejecimiento. Esto puede causar dolor o prdida de la sensibilidad (adormecimiento) que comienza en el cuello y va hasta el brazo y los dedos. Con frecuencia, esta afeccin mejora con reposo. Tal vez sea necesario administrar un tratamiento si la afeccin no mejora. Cules son las causas?  Lesin en el cuello.  Abombamiento discal en la columna vertebral.  Movimientos musculares que no puede controlar (espasmos musculares).  Tensin de los msculos del cuello debido al uso excesivo.  Artritis.  Fractura de los [place] y  las articulaciones de la columna (espondiloartrosis) debido al envejecimiento.  Espolones  seos que se forman cerca de los nervios del cuello. Cules son los signos o los sntomas?  Dolor. El dolor puede [employer] las siguientes caractersticas: ? Va desde el cuello hasta el brazo y [place]. ? Es muy intenso o irritante. ? Empeora al mover el cuello.  Prdida de la sensibilidad o adormecimiento en el brazo o la mano.  Debilidad en el brazo o la [place], en casos muy graves. Cmo se trata? En muchos casos, no se requiere tratamiento para esta afeccin. Con reposo, esta generalmente mejora con el tiempo. Si es Arts development officer, las opciones pueden incluir lo siguiente:  El uso de un collarn cervical blando durante perodos cortos, como se lo haya indicado el mdico.  Optometrist ejercicios (fisioterapia) para Software engineer los msculos del cuello.  Tomar medicamentos.  Aplicarse inyecciones en la columna vertebral, en casos muy graves.  Someterse a [place]. Esto puede ser necesario si otros tratamientos no son eficaces. El tipo de [place] que se realiza depende de la causa de Engineer, manufacturing systems. Siga estas instrucciones en su casa: Si tiene un collarn cervical blando:  selo como se lo haya indicado el mdico. Quteselo solamente como se lo haya indicado el mdico.  Pregntele al mdico si puede quitarse el collarn para baarse o higienizarse. Si lo autorizan a Programmer, systems para baarse o higienizarse: ? Siga las instrucciones del mdico sobre cmo quitarse el collarn de forma segura. ? Para limpiar el collarn, psele un pao con agua y [place], y squelo bien. ? Retire las almohadillas desmontables del collarn, si las [place], [place]. Lvelas a mano con agua y [place]. Djelas que se sequen por completo antes de volver a ponerlas en el collarn. ? Contrlese la piel debajo del collarn para ver si hay enrojecimiento o llagas. Avsele al mdico si ve algo de lo anterior. Control del [employer] medicamentos de venta libre y los  recetados solamente como se lo haya indicado el mdico.  Si se lo indican, aplique hielo sobre la zona dolorida. ? Si tiene un collarn cervical blando, quteselo como se lo haya indicado el mdico. ? Ponga el hielo en una bolsa plstica. ? Coloque una [employer] piel y Therapist, nutritional. ? Coloque el hielo durante 74minutos, 2 a 3veces por da.  Si aplicarse hielo no le Ship broker, intente Multimedia programmer. Use la fuente de calor que el mdico le recomiende, como una compresa de calor hmedo o una almohadilla trmica. ? Coloque una [employer] piel y la fuente de Freight forwarder. ? Aplique calor durante 20 a 31minutos. ? Retire la fuente de calor si la piel se pone de color rojo brillante. Esto es muy importante si no puede Education officer, environmental, calor o fro. Puede correr un riesgo mayor de sufrir quemaduras.  Puede intentar con un masaje suave en el cuello y los hombros. Actividad  Descanse todo lo que sea necesario.  Retome sus actividades normales segn lo indicado por el mdico. Pregntele al mdico qu actividades son seguras para usted.  Haga ejercicios como se lo hayan indicado el mdico o el fisioterapeuta.  No levante nada que pese ms de 10lb (4.5kg) hasta que el mdico le diga que puede hacerlo sin correr Gaffer. Instrucciones generales  Use una almohada plana para dormir.  No conduzca vehculos mientras [place] el collarn cervical blando. Si no tiene un collarn cervical  blando, pregntele al mdico si puede conducir sin correr Apple Computer se cura su cuello.  Pregntele al mdico si el medicamento recetado le impide conducir o usar maquinaria pesada.  No consuma ningn producto que contenga nicotina o tabaco, como cigarrillos, cigarrillos electrnicos y tabaco de Higher education careers adviser. Estos pueden retrasar la recuperacin. Si necesita ayuda para dejar de fumar, consulte al mdico.  Concurra a todas las visitas de seguimiento como se lo haya indicado el mdico. Esto es importante.  Comunquese con un mdico si:  La afeccin no mejora con el tratamiento. Solicite ayuda inmediatamente si:  El dolor empeora y no se alivia con medicamentos.  Pierde la sensibilidad o siente debilidad en la mano, el brazo, el rostro o la pierna.  Tiene fiebre alta.  Presenta rigidez en el cuello.  Nopuede controlar la evacuacin de materia fecal o de pis (tiene incontinencia).  Tiene dificultad para caminar, mantener el equilibrio o hablar. Resumen  La radiculopata cervical quiere decir que un nervio del cuello est comprimido o daado.  Un nervio puede pinzarse por un abultamiento de disco, artritis, una lesin en el cuello u otras causas.  Los sntomas Research scientist (physical sciences), hormigueo o prdida de la sensibilidad que va desde el cuello hasta el brazo o la [place].  En casos muy graves, puede presentarse debilidad en el brazo o la [place].  El tratamiento puede incluir reposo, usar un collarn cervical blando y Field seismologist [place]. Es posible que necesite tomar medicamentos para Conservation officer, historic buildings. En casos muy graves, podra ser necesario aplicarse inyecciones o someterse a [place]. Esta informacin no tiene Marine scientist el consejo del mdico. Asegrese de hacerle al mdico cualquier pregunta que tenga. Document Released: 08/15/2011 Document Revised: 09/14/2018 Document Reviewed: 09/14/2018 Elsevier Patient Education  [place] en gatillo Trigger Finger  El dedo en gatillo (tenosinovitis estenosante) es una afeccin que causa que el dedo quede trabado en una posicin doblada. Cada dedo tiene un tejido duro, parecido a una cuerda, que conecta el msculo al hueso (tendn), y cada tendn est rodeado por un tnel de tejido (vaina tendinosa). Para mover el dedo, el tendn debe deslizarse libremente por la vaina. El dedo en gatillo ocurre cuando el tendn o la vaina se engrosan, dificultando el movimiento del dedo. El dedo en gatillo puede afectar cualquier dedo o Counselling psychologist. Puede  verse afectado ms de un dedo. Los casos leves pueden resolverse con descanso y medicamentos. Los casos graves conllevan ms tratamiento. Cules son las causas? La causa del dedo en gatillo es el engrosamiento del tendn o de la vaina tendinosa de un dedo. Se desconoce la causa de ese engrosamiento. Qu incrementa el riesgo? Los siguientes factores pueden hacer que usted sea ms propenso a Best boy esta afeccin:  Hacer actividades que exigen un agarre fuerte.  Tener artritis reumatoide, gota o diabetes.  Tener entre 28 y 35aos.  Ser mujer. Cules son los signos o los sntomas? Los sntomas de esta afeccin [employer] siguientes:  Dolor al doblar o Naval architect dedo.  Dolor a la palpacin o Estate agent donde el dedo se une a la palma de la [place].  Una protuberancia en la palma de la mano o en la parte interna del dedo.  Or un chasquido al intentar enderezar el dedo.  Tener una sensacin de chasquido, enganche o traba al intentar enderezar el dedo.  No poder enderezar el dedo. Cmo se diagnostica? Esta afeccin se diagnostica en funcin de los sntomas y de un examen  fsico. Cmo se trata? El tratamiento de esta afeccin puede incluir lo siguiente:  Arts development officer dedo y Product/process development scientist actividades que [employer] sntomas.  Usar un entablillado para el dedo para mantenerlo en una posicin levemente doblada.  Tomar antiinflamatorios no esteroides Dayna Ramus) para Best boy y reducir la hinchazn.  Inyectar medicamentos (corticoesteroides) en la vaina tendinosa para disminuir la hinchazn y la irritacin. Es posible que sea necesario repetir las inyecciones.  Someterse a una ciruga para abrir la vaina tendinosa. Esta puede ser Ardelia Mems opcin si otros tratamientos no son eficaces y no puede Naval architect dedo. Es probable que necesite fisioterapia luego de la [place]. Siga estas indicaciones en su casa:   Use calor hmedo para ayudar a reducir Conservation officer, historic buildings y la hinchazn como  se lo haya indicado el mdico.  Descanse el dedo y evite las actividades que empeoren Conservation officer, historic buildings. Retome sus actividades normales como se lo haya indicado el mdico.  Si tiene una frula, sela como se lo haya indicado el mdico.  Tome los medicamentos de venta libre y los recetados solamente como se lo haya indicado el mdico.  Consulting civil engineer a todas las visitas de control como se lo haya indicado el mdico. Esto es importante. Comunquese con un mdico si:  Los sntomas no mejoran con el Art therapist. Resumen  El dedo en gatillo (tenosinovitis estenosante) es una afeccin que hace que el dedo quede trabado en posicin doblada y puede causar dificultad y dolor para enderezar el dedo.  Esta afeccin se desarrolla cuando se engrosa el tendn o la vaina tendinosa de un dedo.  El tratamiento comienza con descanso, usar un entablillado y tomar antiinflamatorios no esteroides (AINE).  En los [place] graves, [place] ser necesaria Ardelia Mems ciruga para abrir la vaina tendinosa. Esta informacin no tiene Marine scientist el consejo del mdico. Asegrese de hacerle al mdico cualquier pregunta que tenga. Document Released: 04/28/2013 Document Revised: 05/22/2017 Document Reviewed: 01/26/2013 Elsevier Patient Education  2020 Elsevier Inc.      Agustina Caroli, MD [facility]

## 2019-08-18 NOTE — Patient Instructions (Addendum)
If you have lab work done today you will be contacted with your lab results within the next 2 weeks.  If you have not heard from [place] then please contact us. The fastest way to get your results is to register for My Chart.   IF you received an x-ray today, you will receive an invoice from [facility]. Please contact [facility]iology at [phone] with questions or concerns regarding your invoice.   IF you received labwork today, you will receive an invoice from [facility]. Please contact [facility] at [phone] with questions or concerns regarding your invoice.   Our billing staff will not be able to assist you with questions regarding bills from these companies.  You will be contacted with the lab results as soon as they are available. The fastest way to get your results is to activate your My Chart account. Instructions are located on the last page of this paperwork. If you have not heard from [place] regarding the results in 2 weeks, please contact this office.    We recommend that you schedule a mammogram for breast cancer screening. Typically, you do not need a referral to do this. Please contact a local imaging center to schedule your mammogram.  [facility] - [phone]  *ask for the Radiology Department [facility] [place] [facility] - [phone] or [phone]  [facility] - [phone] [place] [phone] [facility] - [phone]  *ask for the [facility] - [phone]  *ask for the Radiology Department [facility] - [phone]  *ask for the [facility] - [phone] Radiculopata cervical Cervical Radiculopathy  La radiculopata cervical quiere decir que un nervio del cuello (nervio cervical) est comprimido o daado. Esto puede ocurrir debido a una lesin en la columna vertebral cervical (vrtebras) del  cuello, o como parte normal del envejecimiento. Esto puede causar dolor o prdida de la sensibilidad (adormecimiento) que comienza en el cuello y va hasta el brazo y los dedos. Con frecuencia, esta afeccin mejora con reposo. Tal vez sea necesario administrar un tratamiento si la afeccin no mejora. Cules son las causas?  Lesin en el cuello.  Abombamiento discal en la columna vertebral.  Movimientos musculares que no puede controlar (espasmos musculares).  Tensin de los msculos del cuello debido al uso excesivo.  Artritis.  Fractura de los huesos y las articulaciones de la columna (espondiloartrosis) debido al envejecimiento.  Espolones seos que se forman cerca de los nervios del cuello. Cules son los signos o los sntomas?  Dolor. El dolor puede [employer] las siguientes caractersticas: ? Va desde el cuello hasta el brazo y [place]. ? Es muy intenso o irritante. ? Empeora al mover el cuello.  Prdida de la sensibilidad o adormecimiento en el brazo o la mano.  Debilidad en el brazo o la [place], en casos muy graves. Cmo se trata? En muchos casos, no se requiere tratamiento para esta afeccin. Con reposo, esta generalmente mejora con el tiempo. Si es Arts development officer, las opciones pueden incluir lo siguiente:  El uso de un collarn cervical blando durante perodos cortos, como se lo haya indicado el mdico.  Optometrist ejercicios (fisioterapia) para Software engineer los msculos del cuello.  Tomar medicamentos.  Aplicarse inyecciones en la columna vertebral, en casos muy graves.  Someterse a [place]. Esto puede ser necesario si otros tratamientos no son eficaces. El tipo de [place] que se realiza depende  de la causa de la afeccin. Siga estas instrucciones en su casa: Si tiene un collarn cervical blando:  selo como se lo haya indicado el mdico. Quteselo solamente como se lo haya indicado el mdico.  Pregntele al mdico si puede quitarse el collarn para  baarse o higienizarse. Si lo autorizan a Programmer, systems para baarse o higienizarse: ? Siga las instrucciones del mdico sobre cmo quitarse el collarn de forma segura. ? Para limpiar el collarn, psele un pao con agua y [place], y squelo bien. ? Retire las almohadillas desmontables del collarn, si las [place], [place]. Lvelas a mano con agua y [place]. Djelas que se sequen por completo antes de volver a ponerlas en el collarn. ? Contrlese la piel debajo del collarn para ver si hay enrojecimiento o llagas. Avsele al mdico si ve algo de lo anterior. Control del [employer] medicamentos de venta libre y los recetados solamente como se lo haya indicado el mdico.  Si se lo indican, aplique hielo sobre la zona dolorida. ? Si tiene un collarn cervical blando, quteselo como se lo haya indicado el mdico. ? Ponga el hielo en una bolsa plstica. ? Coloque una [employer] piel y Therapist, nutritional. ? Coloque el hielo durante 19minutos, 2 a 3veces por da.  Si aplicarse hielo no le Ship broker, intente Multimedia programmer. Use la fuente de calor que el mdico le recomiende, como una compresa de calor hmedo o una almohadilla trmica. ? Coloque una [employer] piel y la fuente de Freight forwarder. ? Aplique calor durante 20 a 55minutos. ? Retire la fuente de calor si la piel se pone de color rojo brillante. Esto es muy importante si no puede Education officer, environmental, calor o fro. Puede correr un riesgo mayor de sufrir quemaduras.  Puede intentar con un masaje suave en el cuello y los hombros. Actividad  Descanse todo lo que sea necesario.  Retome sus actividades normales segn lo indicado por el mdico. Pregntele al mdico qu actividades son seguras para usted.  Haga ejercicios como se lo hayan indicado el mdico o el fisioterapeuta.  No levante nada que pese ms de 10lb (4.5kg) hasta que el mdico le diga que puede hacerlo sin correr Gaffer. Instrucciones generales  Use  una almohada plana para dormir.  No conduzca vehculos mientras [place] el collarn cervical blando. Si no tiene un collarn cervical blando, pregntele al mdico si puede conducir sin correr Apple Computer se cura su cuello.  Pregntele al mdico si el medicamento recetado le impide conducir o usar maquinaria pesada.  No consuma ningn producto que contenga nicotina o tabaco, como cigarrillos, cigarrillos electrnicos y tabaco de Higher education careers adviser. Estos pueden retrasar la recuperacin. Si necesita ayuda para dejar de fumar, consulte al mdico.  Concurra a todas las visitas de seguimiento como se lo haya indicado el mdico. Esto es importante. Comunquese con un mdico si:  La afeccin no mejora con el tratamiento. Solicite ayuda inmediatamente si:  El dolor empeora y no se alivia con medicamentos.  Pierde la sensibilidad o siente debilidad en la mano, el brazo, el rostro o la pierna.  Tiene fiebre alta.  Presenta rigidez en el cuello.  Nopuede controlar la evacuacin de materia fecal o de pis (tiene incontinencia).  Tiene dificultad para caminar, mantener el equilibrio o hablar. Resumen  La radiculopata cervical quiere decir que un nervio del cuello est comprimido o daado.  Un nervio puede pinzarse por un abultamiento  de disco, artritis, una lesin en el cuello u otras causas.  Los sntomas Research scientist (physical sciences), hormigueo o prdida de la sensibilidad que va desde el cuello hasta el brazo o la [place].  En casos muy graves, puede presentarse debilidad en el brazo o la [place].  El tratamiento puede incluir reposo, usar un collarn cervical blando y Field seismologist [place]. Es posible que necesite tomar medicamentos para Conservation officer, historic buildings. En casos muy graves, podra ser necesario aplicarse inyecciones o someterse a [place]. Esta informacin no tiene Marine scientist el consejo del mdico. Asegrese de hacerle al mdico cualquier pregunta que tenga. Document Released: 08/15/2011 Document Revised: 09/14/2018  Document Reviewed: 09/14/2018 Elsevier Patient Education  [place] en gatillo Trigger Finger  El dedo en gatillo (tenosinovitis estenosante) es una afeccin que causa que el dedo quede trabado en una posicin doblada. Cada dedo tiene un tejido duro, parecido a una cuerda, que conecta el msculo al hueso (tendn), y cada tendn est rodeado por un tnel de tejido (vaina tendinosa). Para mover el dedo, el tendn debe deslizarse libremente por la vaina. El dedo en gatillo ocurre cuando el tendn o la vaina se engrosan, dificultando el movimiento del dedo. El dedo en gatillo puede afectar cualquier dedo o Counselling psychologist. Puede verse afectado ms de un dedo. Los casos leves pueden resolverse con descanso y medicamentos. Los casos graves conllevan ms tratamiento. Cules son las causas? La causa del dedo en gatillo es el engrosamiento del tendn o de la vaina tendinosa de un dedo. Se desconoce la causa de ese engrosamiento. Qu incrementa el riesgo? Los siguientes factores pueden hacer que usted sea ms propenso a Best boy esta afeccin:  Hacer actividades que exigen un agarre fuerte.  Tener artritis reumatoide, gota o diabetes.  Tener entre 33 y 26aos.  Ser mujer. Cules son los signos o los sntomas? Los sntomas de esta afeccin [employer] siguientes:  Dolor al doblar o Naval architect dedo.  Dolor a la palpacin o Estate agent donde el dedo se une a la palma de la [place].  Una protuberancia en la palma de la mano o en la parte interna del dedo.  Or un chasquido al intentar enderezar el dedo.  Tener una sensacin de chasquido, enganche o traba al intentar enderezar el dedo.  No poder enderezar el dedo. Cmo se diagnostica? Esta afeccin se diagnostica en funcin de los sntomas y de un examen fsico. Cmo se trata? El tratamiento de esta afeccin puede incluir lo siguiente:  Arts development officer dedo y Product/process development scientist actividades que [employer] sntomas.  Usar un  entablillado para el dedo para mantenerlo en una posicin levemente doblada.  Tomar antiinflamatorios no esteroides Dayna Ramus) para Best boy y reducir la hinchazn.  Inyectar medicamentos (corticoesteroides) en la vaina tendinosa para disminuir la hinchazn y la irritacin. Es posible que sea necesario repetir las inyecciones.  Someterse a una ciruga para abrir la vaina tendinosa. Esta puede ser Ardelia Mems opcin si otros tratamientos no son eficaces y no puede Naval architect dedo. Es probable que necesite fisioterapia luego de la [place]. Siga estas indicaciones en su casa:   Use calor hmedo para ayudar a reducir Conservation officer, historic buildings y la hinchazn como se lo haya indicado el mdico.  Descanse el dedo y evite las actividades que empeoren Conservation officer, historic buildings. Retome sus [employer] se lo haya indicado el mdico.  Si tiene una frula, sela como se lo haya indicado el mdico.  Tome los medicamentos de [place] y  los recetados solamente como se lo haya indicado el mdico.  Concurra a todas las visitas de control como se lo haya indicado el mdico. Esto es importante. Comunquese con un mdico si:  Los sntomas no mejoran con el Art therapist. Resumen  El dedo en gatillo (tenosinovitis estenosante) es una afeccin que hace que el dedo quede trabado en posicin doblada y puede causar dificultad y dolor para enderezar el dedo.  Esta afeccin se desarrolla cuando se engrosa el tendn o la vaina tendinosa de un dedo.  El tratamiento comienza con descanso, usar un entablillado y tomar antiinflamatorios no esteroides (AINE).  En los [place] graves, [place] ser necesaria Ardelia Mems ciruga para abrir la vaina tendinosa. Esta informacin no tiene Marine scientist el consejo del mdico. Asegrese de hacerle al mdico cualquier pregunta que tenga. Document Released: 04/28/2013 Document Revised: 05/22/2017 Document Reviewed: 01/26/2013 Elsevier Patient Education  2020 [employer].

## 2019-08-24 ENCOUNTER — Telehealth: Payer: Self-pay | Admitting: *Deleted

## 2019-08-24 NOTE — Telephone Encounter (Signed)
Ok to remove from imaging hold.  °

## 2019-08-24 NOTE — Telephone Encounter (Signed)
Patient removed from IMG hold.   Encounter closed.

## 2019-08-24 NOTE — Telephone Encounter (Signed)
Patient is in imaging hold for f/u with dr. Denman George for cystic fibroid uterus.   Patient was seen by Dr. Denman George on 08/09/19, is scheduled for surgery on 09/30/19.   Dr. Quincy Simmonds -ok to remove from imaging hold?

## 2019-08-26 ENCOUNTER — Encounter: Payer: Self-pay | Admitting: Orthopaedic Surgery

## 2019-08-26 ENCOUNTER — Other Ambulatory Visit: Payer: Self-pay

## 2019-08-26 ENCOUNTER — Ambulatory Visit: Payer: Self-pay

## 2019-08-26 ENCOUNTER — Ambulatory Visit (INDEPENDENT_AMBULATORY_CARE_PROVIDER_SITE_OTHER): Payer: BC Managed Care – PPO | Admitting: Orthopaedic Surgery

## 2019-08-26 DIAGNOSIS — M542 Cervicalgia: Secondary | ICD-10-CM | POA: Diagnosis not present

## 2019-08-26 DIAGNOSIS — M65312 Trigger thumb, left thumb: Secondary | ICD-10-CM | POA: Diagnosis not present

## 2019-08-26 MED ORDER — PREDNISONE 10 MG (21) PO TBPK: ORAL_TABLET | ORAL | 0 refills | Status: DC

## 2019-08-26 MED ORDER — METHOCARBAMOL 500 MG PO TABS: 500.0000 mg | ORAL_TABLET | Freq: Two times a day (BID) | ORAL | 0 refills | Status: DC | PRN

## 2019-08-26 MED ORDER — LIDOCAINE HCL 1 % IJ SOLN
1.0000 mL | INTRAMUSCULAR | Status: AC | PRN
  Administered 2019-08-26: 09:00:00 1 mL

## 2019-08-26 MED ORDER — BUPIVACAINE HCL 0.25 % IJ SOLN
0.3300 mL | INTRAMUSCULAR | Status: AC | PRN
  Administered 2019-08-26: 09:00:00 .33 mL

## 2019-08-26 MED ORDER — METHYLPREDNISOLONE ACETATE 40 MG/ML IJ SUSP
13.3300 mg | INTRAMUSCULAR | Status: AC | PRN
  Administered 2019-08-26: 09:00:00 13.33 mg

## 2019-08-26 NOTE — Progress Notes (Signed)
Office Visit Note   Patient: Kelli Hale           Date of Birth: 01/16/1971           MRN: [id number] Visit Date: 08/26/2019              Requested by: Horald Pollen, MD [place],  [place] [address] PCP: Horald Pollen, MD   Assessment & Plan: Visit Diagnoses:  1. Neck pain   2. Trigger thumb, left thumb     Plan: Impression cervical spine radiculopathy left upper extremity and left trigger thumb.  In regards to the neck, called in a Sterapred taper muscle relaxer.  We will also start her in physical therapy.  Internal prescription was sent in.  In regards to the left trigger thumb, we will inject this with cortisone today.  She will follow-up with [place] as needed.  Follow-Up Instructions: Return if symptoms worsen or fail to improve.   Orders:  Orders Placed This Encounter  Procedures  . Hand/UE Inj: L thumb A1  . XR Cervical Spine 2 or 3 views  . Ambulatory referral to Physical Therapy   Meds ordered this encounter  Medications  . predniSONE (STERAPRED UNI-PAK 21 TAB) 10 MG (21) TBPK tablet    Sig: Take as directed    Dispense:  21 tablet    Refill:  0  . methocarbamol (ROBAXIN) 500 MG tablet    Sig: Take 1 tablet (500 mg total) by mouth 2 (two) times daily as needed for muscle spasms.    Dispense:  20 tablet    Refill:  0      Procedures: Hand/UE Inj: L thumb A1 for trigger finger on 08/26/2019 8:47 AM Indications: pain Details: 25 G needle Medications: 1 mL lidocaine 1 %; 0.33 mL bupivacaine 0.25 %; 13.33 mg methylPREDNISolone acetate 40 MG/ML      Clinical Data: No additional findings.   Subjective: Chief Complaint  Patient presents with  . Neck - Pain    HPI patient is a pleasant 48 year old Spanish-speaking female who comes in today with an interpreter.  She is complaining of neck and left thumb pain.  In regards to the neck, her pain has been ongoing for the past 2 months.  The pain she has is to the left side of  the neck and radiates down the arm and into the hand.  She denies any numbness, tingling or burning to the left upper extremity.  No weakness to the left upper extremity.  Turning her head to the right seems to worsen her symptoms.  She has tried NSAIDs without relief of symptoms.  No previous injury or injection to the cervical spine.  In regards to her left thumb, she has had pain and triggering for the past 3 weeks.  No injury.  No previous trigger finger.  Review of Systems as detailed in HPI.  All others reviewed and are negative.   Objective: Vital Signs: There were no vitals taken for this visit.  Physical Exam well-developed well-nourished female no acute distress.  Alert oriented x3.  Ortho Exam examination of the neck reveals no spinous or paraspinous tenderness.  She does have slight tenderness along the trapezius and into the parascapular region.  Increased pain with all motions of the neck.  Full range of motion of the shoulder.  No focal weakness.  She does have a painful and palpable nodule at the A1 pulley to the left thumb.  Active triggering.  She is neurovascular intact distally.  Specialty Comments:  No specialty comments available.  Imaging: XR Cervical Spine 2 or 3 views  Result Date: 08/26/2019 Multilevel diffuse spondylosis    PMFS History: Patient Active Problem List   Diagnosis Date Noted  . Dyslipidemia 08/18/2019  . Fibroids, intramural 08/09/2019  . Hypothyroidism 06/28/2018  . Fibroid 06/04/2018   Past Medical History:  Diagnosis Date  . Allergy   . Anemia   . Constipation    occasionally- but daily or weekly   . Elevated cholesterol   . Fibroid   . Hypothyroidism 2019  . Hypothyroidism 2019  . Thyroid disease 2005    Family History  Problem Relation Age of Onset  . Heart disease Mother   . Rectal cancer Neg Hx   . Stomach cancer Neg Hx   . Colon cancer Neg Hx   . Esophageal cancer Neg Hx   . Colon polyps Neg Hx   . Breast cancer Neg Hx      Past Surgical History:  Procedure Laterality Date  . CESAREAN SECTION     x 1  . COLPOSCOPY    . DILATATION & CURETTAGE/HYSTEROSCOPY WITH MYOSURE N/A 07/28/2018   Procedure: DILATATION & CURETTAGE/HYSTEROSCOPY WITH MYOSURE;  Surgeon: Nunzio Cobbs, MD;  Location: [facility];  Service: Gynecology;  Laterality: N/A;  rep will be here confirmed on 07/27/18  . TUBAL LIGATION    . UPPER GASTROINTESTINAL ENDOSCOPY  01/16/2018   Social History   Occupational History  . Not on file  Tobacco Use  . Smoking status: Never Smoker  . Smokeless tobacco: Never Used  Substance and Sexual Activity  . Alcohol use: No  . Drug use: No  . Sexual activity: Yes    Birth control/protection: Surgical

## 2019-09-17 DIAGNOSIS — M542 Cervicalgia: Secondary | ICD-10-CM | POA: Diagnosis not present

## 2019-09-17 DIAGNOSIS — M2560 Stiffness of unspecified joint, not elsewhere classified: Secondary | ICD-10-CM | POA: Diagnosis not present

## 2019-09-17 DIAGNOSIS — M6281 Muscle weakness (generalized): Secondary | ICD-10-CM | POA: Diagnosis not present

## 2019-09-17 DIAGNOSIS — M546 Pain in thoracic spine: Secondary | ICD-10-CM | POA: Diagnosis not present

## 2019-09-21 DIAGNOSIS — M6281 Muscle weakness (generalized): Secondary | ICD-10-CM | POA: Diagnosis not present

## 2019-09-21 DIAGNOSIS — M542 Cervicalgia: Secondary | ICD-10-CM | POA: Diagnosis not present

## 2019-09-21 DIAGNOSIS — M546 Pain in thoracic spine: Secondary | ICD-10-CM | POA: Diagnosis not present

## 2019-09-21 DIAGNOSIS — M2560 Stiffness of unspecified joint, not elsewhere classified: Secondary | ICD-10-CM | POA: Diagnosis not present

## 2019-09-23 DIAGNOSIS — M546 Pain in thoracic spine: Secondary | ICD-10-CM | POA: Diagnosis not present

## 2019-09-23 DIAGNOSIS — M6281 Muscle weakness (generalized): Secondary | ICD-10-CM | POA: Diagnosis not present

## 2019-09-23 DIAGNOSIS — M542 Cervicalgia: Secondary | ICD-10-CM | POA: Diagnosis not present

## 2019-09-23 DIAGNOSIS — M2560 Stiffness of unspecified joint, not elsewhere classified: Secondary | ICD-10-CM | POA: Diagnosis not present

## 2019-09-27 ENCOUNTER — Other Ambulatory Visit (HOSPITAL_COMMUNITY)
Admission: RE | Admit: 2019-09-27 | Discharge: 2019-09-27 | Disposition: A | Discharge status: Home / Self Care | Payer: BC Managed Care – PPO | Source: Ambulatory Visit | Attending: Gynecologic Oncology | Admitting: Gynecologic Oncology

## 2019-09-27 DIAGNOSIS — M546 Pain in thoracic spine: Secondary | ICD-10-CM | POA: Diagnosis not present

## 2019-09-27 DIAGNOSIS — Z01812 Encounter for preprocedural laboratory examination: Secondary | ICD-10-CM | POA: Diagnosis not present

## 2019-09-27 DIAGNOSIS — M2560 Stiffness of unspecified joint, not elsewhere classified: Secondary | ICD-10-CM | POA: Diagnosis not present

## 2019-09-27 DIAGNOSIS — M542 Cervicalgia: Secondary | ICD-10-CM | POA: Diagnosis not present

## 2019-09-27 DIAGNOSIS — M6281 Muscle weakness (generalized): Secondary | ICD-10-CM | POA: Diagnosis not present

## 2019-09-27 DIAGNOSIS — Z20822 Contact with and (suspected) exposure to covid-19: Secondary | ICD-10-CM | POA: Insufficient documentation

## 2019-09-27 NOTE — Progress Notes (Signed)
PCP -  Horald Pollen MD Cardiologist - NA  Chest x-ray - NA EKG - NA Stress Test - NA ECHO - NA Cardiac Cath - NA  Sleep Study - NA CPAP - NA  Fasting Blood Sugar -  Checks Blood Sugar _____ times a day  Blood Thinner Instructions: Aspirin Instructions: Last Dose:  Anesthesia review:   Patient denies shortness of breath, fever, cough and chest pain at PAT appointment   Patient verbalized understanding of instructions that were given to them at the PAT appointment. Patient was also instructed that they will need to review over the PAT instructions again at home before surgery.

## 2019-09-27 NOTE — Patient Instructions (Addendum)
DUE TO COVID-19 ONLY ONE VISITOR IS ALLOWED TO COME WITH YOU AND STAY IN THE WAITING ROOM ONLY DURING PRE OP AND PROCEDURE DAY OF SURGERY. THE 1 VISITOR MAY VISIT WITH YOU AFTER SURGERY IN YOUR PRIVATE ROOM DURING VISITING HOURS ONLY!  YOU NEED TO HAVE A COVID 19 TEST ON__Monday 1/18/2021_____ @_______ , THIS TEST MUST BE DONE BEFORE SURGERY, COME  [place] [place] , [id number].  ([name]) ONCE YOUR COVID TEST IS COMPLETED, PLEASE BEGIN THE QUARANTINE INSTRUCTIONS AS OUTLINED IN YOUR HANDOUT.                Kelli Hale    Your procedure is scheduled on: Thursday 09/30/2019   Report to [facility] Main  Entrance    Report to Short Stay at 5:30 AM     Call this number if you have problems the morning of surgery [phone]    Remember: Do not eat food after Midnight.   BRUSH YOUR TEETH MORNING OF SURGERY AND RINSE YOUR MOUTH OUT, NO CHEWING GUM CANDY OR MINTS.  NO SOLID FOOD AFTER MIDNIGHT THE NIGHT PRIOR TO SURGERY. NOTHING BY MOUTH EXCEPT CLEAR LIQUIDS UNTIL 04:30 AM.  Eat a light diet the day before surgery.  Examples including soups, broths, toast, yogurt, mashed potatoes.  Things to avoid include carbonated beverages (fizzy beverages), raw fruits and raw vegetables, or beans.   If your bowels are filled with gas, your surgeon will have difficulty visualizing your pelvic organs which increases your surgical risks.      CLEAR LIQUID DIET   Foods Allowed                                                                     Foods Excluded  Coffee and tea, regular and decaf                             liquids that you cannot  Plain Jell-O any favor except red or purple             see through such as: Fruit ices (not with fruit pulp)                                     milk, soups, orange juice  Iced Popsicles                                    All solid food regular and diet                                    Cranberry, grape and  apple juices Sports drinks like Gatorade Lightly seasoned clear broth or consume(fat free) Sugar, honey syrup  Sample Menu Breakfast                                Lunch  Supper Cranberry juice                    Beef broth                            Chicken broth Jell-O                                     Grape juice                           Apple juice Coffee or tea                        Jell-O                                      Popsicle                                                Coffee or tea                        Coffee or tea  _____________________________________________________________________    Take these medicines the morning of surgery with A SIP OF WATER: Levothyroxine (Synthroid), Rosuvastatin (Crestor)                                 You may not have any metal on your body including hair pins and              piercings  Do not wear jewelry, make-up, lotions, powders or perfumes, deodorant             Do not wear nail polish on your fingernails.  Do not shave  48 hours prior to surgery.              .   Do not bring valuables to the hospital. [place].  Contacts, dentures or bridgework may not be worn into surgery.  Leave suitcase in the car. After surgery it may be brought to your room.     Patients discharged the day of surgery will not be allowed to drive home. IF YOU ARE HAVING SURGERY AND GOING HOME THE SAME DAY, YOU MUST HAVE AN ADULT TO DRIVE YOU HOME AND BE WITH YOU FOR 24 HOURS. YOU MAY GO HOME BY TAXI OR UBER OR ORTHERWISE, BUT AN ADULT MUST ACCOMPANY YOU HOME AND STAY WITH YOU FOR 24 HOURS.    Name and phone number of your driver:  Special Instructions: N/A              Please read over the following fact sheets you were given: _____________________________________________________________________             [facility] - Preparing for Surgery Before surgery,  you can play an important role.  Because skin is not sterile, your skin needs to be as free of germs  as possible.  You can reduce the number of germs on your skin by washing with CHG (chlorahexidine gluconate) soap before surgery.  CHG is an antiseptic cleaner which kills germs and bonds with the skin to continue killing germs even after washing. Please DO NOT use if you have an allergy to CHG or antibacterial soaps.  If your skin becomes reddened/irritated stop using the CHG and inform your nurse when you arrive at Short Stay. Do not shave (including legs and underarms) for at least 48 hours prior to the first CHG shower.  You may shave your face/neck. Please follow these instructions carefully:  1.  Shower with CHG Soap the night before surgery and the  morning of Surgery.  2.  If you choose to wash your hair, wash your hair first as usual with your  normal  shampoo.  3.  After you shampoo, rinse your hair and body thoroughly to remove the  shampoo.                            4.  Use CHG as you would any other liquid soap.  You can apply chg directly  to the skin and wash                       Gently with a scrungie or clean washcloth.  5.  Apply the CHG Soap to your body ONLY FROM THE NECK DOWN.   Do not use on face/ open                           Wound or open sores. Avoid contact with eyes, ears mouth and genitals (private parts).                       Wash face,  Genitals (private parts) with your normal soap.             6.  Wash thoroughly, paying special attention to the area where your surgery  will be performed.  7.  Thoroughly rinse your body with warm water from the neck down.  8.  DO NOT shower/wash with your normal soap after using and rinsing off  the CHG Soap.                9.  Pat yourself dry with a clean towel.            10.  Wear clean pajamas.            11.  Place clean sheets on your bed the night of your first shower and do not  sleep with pets. Day of Surgery : Do not  apply any lotions/deodorants the morning of surgery.  Please wear clean clothes to the hospital/surgery center.  FAILURE TO FOLLOW THESE INSTRUCTIONS MAY RESULT IN THE CANCELLATION OF YOUR SURGERY PATIENT SIGNATURE_________________________________  NURSE SIGNATURE__________________________________  ________________________________________________________________________   Kelli Hale  An incentive spirometer is a tool that can help keep your lungs clear and active. This tool measures how well you are filling your lungs with each breath. Taking long deep breaths may help reverse or decrease the chance of developing breathing (pulmonary) problems (especially infection) following:  A long period of time when you are unable to move or be active. BEFORE THE PROCEDURE   If the spirometer includes an indicator to show your best effort, your nurse or  respiratory therapist will set it to a desired goal.  If possible, sit up straight or lean slightly forward. Try not to slouch.  Hold the incentive spirometer in an upright position. INSTRUCTIONS FOR USE  1. Sit on the edge of your bed if possible, or sit up as far as you can in bed or on a chair. 2. Hold the incentive spirometer in an upright position. 3. Breathe out normally. 4. Place the mouthpiece in your mouth and seal your lips tightly around it. 5. Breathe in slowly and as deeply as possible, raising the piston or the ball toward the top of the column. 6. Hold your breath for 3-5 seconds or for as long as possible. Allow the piston or ball to fall to the bottom of the column. 7. Remove the mouthpiece from your mouth and breathe out normally. 8. Rest for a few seconds and repeat Steps 1 through 7 at least 10 times every 1-2 hours when you are awake. Take your time and take a few normal breaths between deep breaths. 9. The spirometer may include an indicator to show your best effort. Use the indicator as a goal to work toward during  each repetition. 10. After each set of 10 deep breaths, practice coughing to be sure your lungs are clear. If you have an incision (the cut made at the time of surgery), support your incision when coughing by placing a pillow or rolled up towels firmly against it. Once you are able to get out of bed, walk around indoors and cough well. You may stop using the incentive spirometer when instructed by your caregiver.  RISKS AND COMPLICATIONS  Take your time so you do not get dizzy or light-headed.  If you are in pain, you may need to take or ask for pain medication before doing incentive spirometry. It is harder to take a deep breath if you are having pain. AFTER USE  Rest and breathe slowly and easily.  It can be helpful to keep track of a log of your progress. Your caregiver can provide you with a simple table to help with this. If you are using the spirometer at home, follow these instructions: [place] IF:   You are having difficultly using the spirometer.  You have trouble using the spirometer as often as instructed.  Your pain medication is not giving enough relief while using the spirometer.  You develop fever of 100.5 F (38.1 C) or higher. SEEK IMMEDIATE MEDICAL CARE IF:   You cough up bloody sputum that had not been present before.  You develop fever of 102 F (38.9 C) or greater.  You develop worsening pain at or near the incision site. MAKE SURE YOU:   Understand these instructions.  Will watch your condition.  Will get help right away if you are not doing well or get worse. Document Released: 01/06/2007 Document Revised: 11/18/2011 Document Reviewed: 03/09/2007 Exit[employer] Patient Information 2014 [employer], [place].   ________________________________________________________________________  WHAT IS A BLOOD TRANSFUSION? Blood Transfusion Information  A transfusion is the replacement of blood or some of its parts. Blood is made up of multiple cells which  provide different functions.  Red blood cells carry oxygen and are used for blood loss replacement.  White blood cells fight against infection.  Platelets control bleeding.  Plasma helps clot blood.  Other blood products are available for specialized needs, such as hemophilia or other clotting disorders. BEFORE THE TRANSFUSION  Who gives blood for transfusions?   Healthy volunteers  who are fully evaluated to make sure their blood is safe. This is blood bank blood. Transfusion therapy is the safest it has ever been in the practice of medicine. Before blood is taken from a donor, a complete history is taken to make sure that person has no history of diseases nor engages in risky social behavior (examples are intravenous drug use or sexual activity with multiple partners). The donor's travel history is screened to minimize risk of transmitting infections, such as malaria. The donated blood is tested for signs of infectious diseases, such as HIV and hepatitis. The blood is then tested to be sure it is compatible with you in order to minimize the chance of a transfusion reaction. If you or a relative donates blood, this is often done in anticipation of surgery and is not appropriate for emergency situations. It takes many days to process the donated blood. RISKS AND COMPLICATIONS Although transfusion therapy is very safe and saves many lives, the main dangers of transfusion include:   Getting an infectious disease.  Developing a transfusion reaction. This is an allergic reaction to something in the blood you were given. Every precaution is taken to prevent this. The decision to have a blood transfusion has been considered carefully by your caregiver before blood is given. Blood is not given unless the benefits outweigh the risks. AFTER THE TRANSFUSION  Right after receiving a blood transfusion, you will usually feel much better and more energetic. This is especially true if your red blood cells  have gotten low (anemic). The transfusion raises the level of the red blood cells which carry oxygen, and this usually causes an energy increase.  The nurse administering the transfusion will monitor you carefully for complications. HOME CARE INSTRUCTIONS  No special instructions are needed after a transfusion. You may find your energy is better. Speak with your caregiver about any limitations on activity for underlying diseases you may have. SEEK MEDICAL CARE IF:   Your condition is not improving after your transfusion.  You develop redness or irritation at the intravenous (IV) site. SEEK IMMEDIATE MEDICAL CARE IF:  Any of the following symptoms occur over the next 12 hours:  Shaking chills.  You have a temperature by mouth above 102 F (38.9 C), not controlled by medicine.  Chest, back, or muscle pain.  People around you feel you are not acting correctly or are confused.  Shortness of breath or difficulty breathing.  Dizziness and fainting.  You get a rash or develop hives.  You have a decrease in urine output.  Your urine turns a dark color or changes to pink, red, or brown. Any of the following symptoms occur over the next 10 days:  You have a temperature by mouth above 102 F (38.9 C), not controlled by medicine.  Shortness of breath.  Weakness after normal activity.  The white part of the eye turns yellow (jaundice).  You have a decrease in the amount of urine or are urinating less often.  Your urine turns a dark color or changes to pink, red, or brown. Document Released: 08/23/2000 Document Revised: 11/18/2011 Document Reviewed: 04/11/2008 [facility] Patient Information 2014 [place], [place].  _______________________________________________________________________

## 2019-09-28 DIAGNOSIS — M6281 Muscle weakness (generalized): Secondary | ICD-10-CM | POA: Diagnosis not present

## 2019-09-28 DIAGNOSIS — M2560 Stiffness of unspecified joint, not elsewhere classified: Secondary | ICD-10-CM | POA: Diagnosis not present

## 2019-09-28 DIAGNOSIS — M546 Pain in thoracic spine: Secondary | ICD-10-CM | POA: Diagnosis not present

## 2019-09-28 DIAGNOSIS — M542 Cervicalgia: Secondary | ICD-10-CM | POA: Diagnosis not present

## 2019-09-28 LAB — NOVEL CORONAVIRUS, NAA (HOSP ORDER, SEND-OUT TO REF LAB; TAT 18-24 HRS): SARS-CoV-2, NAA: NOT DETECTED

## 2019-09-29 ENCOUNTER — Other Ambulatory Visit: Payer: Self-pay

## 2019-09-29 ENCOUNTER — Telehealth: Payer: Self-pay | Admitting: *Deleted

## 2019-09-29 ENCOUNTER — Encounter (HOSPITAL_COMMUNITY)
Admission: RE | Admit: 2019-09-29 | Discharge: 2019-09-29 | Disposition: A | Discharge status: Home / Self Care | Payer: BC Managed Care – PPO | Source: Ambulatory Visit | Attending: Gynecologic Oncology | Admitting: Gynecologic Oncology

## 2019-09-29 ENCOUNTER — Encounter (HOSPITAL_COMMUNITY): Payer: Self-pay

## 2019-09-29 DIAGNOSIS — Z01812 Encounter for preprocedural laboratory examination: Secondary | ICD-10-CM | POA: Diagnosis not present

## 2019-09-29 LAB — URINALYSIS, ROUTINE W REFLEX MICROSCOPIC
Bilirubin Urine: NEGATIVE
Glucose, UA: NEGATIVE mg/dL
Ketones, ur: NEGATIVE mg/dL
Leukocytes,Ua: NEGATIVE
Nitrite: POSITIVE — AB
Protein, ur: NEGATIVE mg/dL
Specific Gravity, Urine: 1.023 (ref 1.005–1.030)
pH: 5 (ref 5.0–8.0)

## 2019-09-29 LAB — COMPREHENSIVE METABOLIC PANEL
ALT: 18 U/L (ref 0–44)
AST: 21 U/L (ref 15–41)
Albumin: 4.1 g/dL (ref 3.5–5.0)
Alkaline Phosphatase: 88 U/L (ref 38–126)
Anion gap: 9 (ref 5–15)
BUN: 12 mg/dL (ref 6–20)
CO2: 24 mmol/L (ref 22–32)
Calcium: 9.1 mg/dL (ref 8.9–10.3)
Chloride: 106 mmol/L (ref 98–111)
Creatinine, Ser: 0.59 mg/dL (ref 0.44–1.00)
GFR calc Af Amer: >60 mL/min (ref >60)
GFR calc non Af Amer: >60 mL/min (ref >60)
Glucose, Bld: 88 mg/dL (ref 70–99)
Potassium: 3.5 mmol/L (ref 3.5–5.1)
Sodium: 139 mmol/L (ref 135–145)
Total Bilirubin: 0.5 mg/dL (ref 0.3–1.2)
Total Protein: 7.2 g/dL (ref 6.5–8.1)

## 2019-09-29 LAB — CBC
HCT: 41.1 % (ref 36.0–46.0)
Hemoglobin: 13.4 g/dL (ref 12.0–15.0)
MCH: 26.8 pg (ref 26.0–34.0)
MCHC: 32.6 g/dL (ref 30.0–36.0)
MCV: 82.2 fL (ref 80.0–100.0)
Platelets: 275 10*3/uL (ref 150–400)
RBC: 5 MIL/uL (ref 3.87–5.11)
RDW: 12.9 % (ref 11.5–15.5)
WBC: 4.7 10*3/uL (ref 4.0–10.5)
nRBC: 0 % (ref 0.0–0.2)

## 2019-09-29 NOTE — Telephone Encounter (Signed)
Spoke with Patient's daughter [name]. She was not with her mother, but I let her know that she can call the office if her mother has any questions or concerns regarding her surgery tomorrow.

## 2019-09-29 NOTE — Anesthesia Preprocedure Evaluation (Addendum)
Anesthesia Evaluation  Patient identified by MRN, date of birth, ID band Patient awake    Reviewed: Allergy & Precautions, H&P , NPO status , Patient's Chart, lab work & pertinent test results, reviewed documented beta blocker date and time   Airway Mallampati: I  TM Distance: >3 FB Neck ROM: full    Dental no notable dental hx. (+) Teeth Intact, Dental Advisory Given   Pulmonary neg pulmonary ROS,    Pulmonary exam normal breath sounds clear to auscultation       Cardiovascular Exercise Tolerance: Good negative cardio ROS   Rhythm:regular Rate:Normal     Neuro/Psych negative neurological ROS  negative psych ROS   GI/Hepatic negative GI ROS, Neg liver ROS,   Endo/Other  Hypothyroidism   Renal/GU negative Renal ROS  negative genitourinary   Musculoskeletal   Abdominal   Peds  Hematology negative hematology ROS (+) Blood dyscrasia, anemia ,   Anesthesia Other Findings   Reproductive/Obstetrics negative OB ROS                            Anesthesia Physical  Anesthesia Plan  ASA: II  Anesthesia Plan: General   Post-op Pain Management:    Induction: Intravenous  PONV Risk Score and Plan: 3 and Ondansetron, Dexamethasone, Midazolam and Treatment may vary due to age or medical condition  Airway Management Planned: LMA and Oral ETT  Additional Equipment:   Intra-op Plan:   Post-operative Plan: Extubation in OR  Informed Consent: I have reviewed the patients History and Physical, chart, labs and discussed the procedure including the risks, benefits and alternatives for the proposed anesthesia with the patient or authorized representative who has indicated his/her understanding and acceptance.     Dental Advisory Given  Plan Discussed with: CRNA, Anesthesiologist and Surgeon  Anesthesia Plan Comments: ( )        Anesthesia Quick Evaluation

## 2019-09-30 ENCOUNTER — Ambulatory Visit (HOSPITAL_COMMUNITY): Payer: BC Managed Care – PPO | Admitting: Physician Assistant

## 2019-09-30 ENCOUNTER — Encounter (HOSPITAL_COMMUNITY): Payer: Self-pay | Admitting: Gynecologic Oncology

## 2019-09-30 ENCOUNTER — Encounter (HOSPITAL_COMMUNITY)
Admission: RE | Disposition: A | Discharge status: Home / Self Care | Payer: Self-pay | Source: Ambulatory Visit | Attending: Gynecologic Oncology

## 2019-09-30 ENCOUNTER — Ambulatory Visit (HOSPITAL_COMMUNITY)
Admission: RE | Admit: 2019-09-30 | Discharge: 2019-09-30 | Disposition: A | Discharge status: Home / Self Care | Payer: BC Managed Care – PPO | Source: Ambulatory Visit | Attending: Gynecologic Oncology | Admitting: Gynecologic Oncology

## 2019-09-30 ENCOUNTER — Other Ambulatory Visit: Payer: Self-pay

## 2019-09-30 DIAGNOSIS — Z8249 Family history of ischemic heart disease and other diseases of the circulatory system: Secondary | ICD-10-CM | POA: Diagnosis not present

## 2019-09-30 DIAGNOSIS — D251 Intramural leiomyoma of uterus: Secondary | ICD-10-CM | POA: Diagnosis not present

## 2019-09-30 DIAGNOSIS — Z79899 Other long term (current) drug therapy: Secondary | ICD-10-CM | POA: Insufficient documentation

## 2019-09-30 DIAGNOSIS — D638 Anemia in other chronic diseases classified elsewhere: Secondary | ICD-10-CM | POA: Diagnosis not present

## 2019-09-30 DIAGNOSIS — Z9889 Other specified postprocedural states: Secondary | ICD-10-CM

## 2019-09-30 DIAGNOSIS — D649 Anemia, unspecified: Secondary | ICD-10-CM | POA: Insufficient documentation

## 2019-09-30 DIAGNOSIS — R112 Nausea with vomiting, unspecified: Secondary | ICD-10-CM

## 2019-09-30 DIAGNOSIS — E7800 Pure hypercholesterolemia, unspecified: Secondary | ICD-10-CM | POA: Diagnosis not present

## 2019-09-30 DIAGNOSIS — N838 Other noninflammatory disorders of ovary, fallopian tube and broad ligament: Secondary | ICD-10-CM | POA: Diagnosis not present

## 2019-09-30 DIAGNOSIS — N840 Polyp of corpus uteri: Secondary | ICD-10-CM | POA: Diagnosis not present

## 2019-09-30 DIAGNOSIS — N800 Endometriosis of uterus: Secondary | ICD-10-CM | POA: Insufficient documentation

## 2019-09-30 DIAGNOSIS — E785 Hyperlipidemia, unspecified: Secondary | ICD-10-CM | POA: Diagnosis not present

## 2019-09-30 DIAGNOSIS — D252 Subserosal leiomyoma of uterus: Secondary | ICD-10-CM | POA: Insufficient documentation

## 2019-09-30 DIAGNOSIS — E039 Hypothyroidism, unspecified: Secondary | ICD-10-CM | POA: Diagnosis not present

## 2019-09-30 DIAGNOSIS — D219 Benign neoplasm of connective and other soft tissue, unspecified: Secondary | ICD-10-CM | POA: Diagnosis present

## 2019-09-30 HISTORY — PX: ROBOTIC ASSISTED TOTAL HYSTERECTOMY: SHX6085

## 2019-09-30 HISTORY — PX: XI ROBOTIC ASSISTED SALPINGECTOMY: SHX6824

## 2019-09-30 LAB — PREGNANCY, URINE: Preg Test, Ur: NEGATIVE

## 2019-09-30 LAB — TYPE AND SCREEN
ABO/RH(D): O POS
Antibody Screen: NEGATIVE

## 2019-09-30 LAB — ABO/RH: ABO/RH(D): O POS

## 2019-09-30 SURGERY — HYSTERECTOMY, TOTAL, ROBOT-ASSISTED
Anesthesia: General

## 2019-09-30 MED ORDER — LIDOCAINE 2% (20 MG/ML) 5 ML SYRINGE
INTRAMUSCULAR | Status: DC | PRN
  Administered 2019-09-30: 100 mg via INTRAVENOUS

## 2019-09-30 MED ORDER — OXYCODONE HCL 5 MG PO TABS: 5.0000 mg | ORAL_TABLET | Freq: Once | ORAL | Status: DC | PRN

## 2019-09-30 MED ORDER — OXYCODONE HCL 5 MG PO TABS
ORAL_TABLET | ORAL | Status: AC
  Administered 2019-09-30: 10 mg via ORAL
  Filled 2019-09-30: qty 2

## 2019-09-30 MED ORDER — FENTANYL CITRATE (PF) 250 MCG/5ML IJ SOLN
INTRAMUSCULAR | Status: DC | PRN
  Administered 2019-09-30: 100 ug via INTRAVENOUS
  Administered 2019-09-30: 50 ug via INTRAVENOUS

## 2019-09-30 MED ORDER — SCOPOLAMINE 1 MG/3DAYS TD PT72
1.0000 | MEDICATED_PATCH | TRANSDERMAL | Status: DC
  Administered 2019-09-30: 1.5 mg via TRANSDERMAL
  Filled 2019-09-30: qty 1

## 2019-09-30 MED ORDER — LACTATED RINGERS IR SOLN
Status: DC | PRN
  Administered 2019-09-30: 1

## 2019-09-30 MED ORDER — ACETAMINOPHEN 500 MG PO TABS
1000.0000 mg | ORAL_TABLET | ORAL | Status: AC
  Administered 2019-09-30: 1000 mg via ORAL
  Filled 2019-09-30: qty 2

## 2019-09-30 MED ORDER — EPHEDRINE 5 MG/ML INJ
INTRAVENOUS | Status: AC
  Filled 2019-09-30: qty 10

## 2019-09-30 MED ORDER — LIDOCAINE 2% (20 MG/ML) 5 ML SYRINGE
INTRAMUSCULAR | Status: DC | PRN
  Administered 2019-09-30: 1.5 mg/kg/h via INTRAVENOUS

## 2019-09-30 MED ORDER — SODIUM CHLORIDE 0.9% FLUSH: 3.0000 mL | Freq: Two times a day (BID) | INTRAVENOUS | Status: DC

## 2019-09-30 MED ORDER — ROCURONIUM BROMIDE 10 MG/ML (PF) SYRINGE
PREFILLED_SYRINGE | INTRAVENOUS | Status: AC
  Filled 2019-09-30: qty 10

## 2019-09-30 MED ORDER — LORAZEPAM 2 MG/ML IJ SOLN
INTRAMUSCULAR | Status: AC
  Filled 2019-09-30: qty 1

## 2019-09-30 MED ORDER — KETAMINE HCL 10 MG/ML IJ SOLN
INTRAMUSCULAR | Status: DC | PRN
  Administered 2019-09-30: 40 mg via INTRAVENOUS

## 2019-09-30 MED ORDER — MEPERIDINE HCL 50 MG/ML IJ SOLN: 6.2500 mg | INTRAMUSCULAR | Status: DC | PRN

## 2019-09-30 MED ORDER — SODIUM CHLORIDE 0.9% FLUSH: 3.0000 mL | INTRAVENOUS | Status: DC | PRN

## 2019-09-30 MED ORDER — KETAMINE HCL 10 MG/ML IJ SOLN
INTRAMUSCULAR | Status: AC
  Filled 2019-09-30: qty 1

## 2019-09-30 MED ORDER — ONDANSETRON HCL 4 MG/2ML IJ SOLN
INTRAMUSCULAR | Status: AC
  Filled 2019-09-30: qty 2

## 2019-09-30 MED ORDER — BUPIVACAINE LIPOSOME 1.3 % IJ SUSP
20.0000 mL | Freq: Once | INTRAMUSCULAR | Status: DC
  Filled 2019-09-30: qty 20

## 2019-09-30 MED ORDER — ACETAMINOPHEN 325 MG PO TABS: 650.0000 mg | ORAL_TABLET | ORAL | Status: DC | PRN

## 2019-09-30 MED ORDER — STERILE WATER FOR IRRIGATION IR SOLN
Status: DC | PRN
  Administered 2019-09-30: 1000 mL

## 2019-09-30 MED ORDER — EPHEDRINE SULFATE-NACL 50-0.9 MG/10ML-% IV SOSY
PREFILLED_SYRINGE | INTRAVENOUS | Status: DC | PRN
  Administered 2019-09-30 (×2): 10 mg via INTRAVENOUS

## 2019-09-30 MED ORDER — CELECOXIB 200 MG PO CAPS
400.0000 mg | ORAL_CAPSULE | ORAL | Status: AC
  Administered 2019-09-30: 400 mg via ORAL
  Filled 2019-09-30: qty 2

## 2019-09-30 MED ORDER — BUPIVACAINE HCL (PF) 0.25 % IJ SOLN
INTRAMUSCULAR | Status: AC
  Filled 2019-09-30: qty 30

## 2019-09-30 MED ORDER — FENTANYL CITRATE (PF) 100 MCG/2ML IJ SOLN
INTRAMUSCULAR | Status: AC
  Filled 2019-09-30: qty 2

## 2019-09-30 MED ORDER — CEFAZOLIN SODIUM-DEXTROSE 2-4 GM/100ML-% IV SOLN
2.0000 g | INTRAVENOUS | Status: AC
  Administered 2019-09-30: 2 g via INTRAVENOUS
  Filled 2019-09-30: qty 100

## 2019-09-30 MED ORDER — GABAPENTIN 300 MG PO CAPS
300.0000 mg | ORAL_CAPSULE | ORAL | Status: AC
  Administered 2019-09-30: 300 mg via ORAL
  Filled 2019-09-30: qty 1

## 2019-09-30 MED ORDER — DEXAMETHASONE SODIUM PHOSPHATE 4 MG/ML IJ SOLN: 4.0000 mg | INTRAMUSCULAR | Status: DC

## 2019-09-30 MED ORDER — LACTATED RINGERS IV SOLN: INTRAVENOUS | Status: DC

## 2019-09-30 MED ORDER — LORAZEPAM 2 MG/ML IJ SOLN
1.0000 mg | Freq: Once | INTRAMUSCULAR | Status: AC
  Administered 2019-09-30: 1 mg via INTRAVENOUS

## 2019-09-30 MED ORDER — MORPHINE SULFATE (PF) 4 MG/ML IV SOLN: 2.0000 mg | INTRAVENOUS | Status: DC | PRN

## 2019-09-30 MED ORDER — ONDANSETRON HCL 4 MG/2ML IJ SOLN
4.0000 mg | Freq: Once | INTRAMUSCULAR | Status: AC | PRN
  Administered 2019-09-30: 4 mg via INTRAVENOUS

## 2019-09-30 MED ORDER — LIDOCAINE HCL 2 % IJ SOLN
INTRAMUSCULAR | Status: AC
  Filled 2019-09-30: qty 20

## 2019-09-30 MED ORDER — ACETAMINOPHEN 160 MG/5ML PO SOLN: 325.0000 mg | ORAL | Status: DC | PRN

## 2019-09-30 MED ORDER — MIDAZOLAM HCL 2 MG/2ML IJ SOLN
INTRAMUSCULAR | Status: DC | PRN
  Administered 2019-09-30: 2 mg via INTRAVENOUS

## 2019-09-30 MED ORDER — FENTANYL CITRATE (PF) 100 MCG/2ML IJ SOLN
25.0000 ug | INTRAMUSCULAR | Status: DC | PRN
  Administered 2019-09-30 (×2): 50 ug via INTRAVENOUS

## 2019-09-30 MED ORDER — DEXAMETHASONE SODIUM PHOSPHATE 10 MG/ML IJ SOLN
INTRAMUSCULAR | Status: AC
  Filled 2019-09-30: qty 1

## 2019-09-30 MED ORDER — OXYCODONE HCL 5 MG PO TABS: 5.0000 mg | ORAL_TABLET | ORAL | Status: DC | PRN

## 2019-09-30 MED ORDER — FENTANYL CITRATE (PF) 250 MCG/5ML IJ SOLN
INTRAMUSCULAR | Status: AC
  Filled 2019-09-30: qty 5

## 2019-09-30 MED ORDER — LIDOCAINE 2% (20 MG/ML) 5 ML SYRINGE
INTRAMUSCULAR | Status: AC
  Filled 2019-09-30: qty 5

## 2019-09-30 MED ORDER — ROCURONIUM BROMIDE 10 MG/ML (PF) SYRINGE
PREFILLED_SYRINGE | INTRAVENOUS | Status: DC | PRN
  Administered 2019-09-30: 50 mg via INTRAVENOUS
  Administered 2019-09-30: 20 mg via INTRAVENOUS

## 2019-09-30 MED ORDER — SUGAMMADEX SODIUM 200 MG/2ML IV SOLN
INTRAVENOUS | Status: DC | PRN
  Administered 2019-09-30: 160 mg via INTRAVENOUS

## 2019-09-30 MED ORDER — DEXAMETHASONE SODIUM PHOSPHATE 10 MG/ML IJ SOLN
INTRAMUSCULAR | Status: DC | PRN
  Administered 2019-09-30: 10 mg via INTRAVENOUS

## 2019-09-30 MED ORDER — OXYCODONE HCL 5 MG/5ML PO SOLN: 5.0000 mg | Freq: Once | ORAL | Status: DC | PRN

## 2019-09-30 MED ORDER — SODIUM CHLORIDE 0.9 % IV SOLN: 250.0000 mL | INTRAVENOUS | Status: DC | PRN

## 2019-09-30 MED ORDER — BUPIVACAINE HCL 0.25 % IJ SOLN
INTRAMUSCULAR | Status: DC | PRN
  Administered 2019-09-30: 20 mL

## 2019-09-30 MED ORDER — ACETAMINOPHEN 650 MG RE SUPP
650.0000 mg | RECTAL | Status: DC | PRN
  Filled 2019-09-30: qty 1

## 2019-09-30 MED ORDER — ACETAMINOPHEN 325 MG PO TABS: 325.0000 mg | ORAL_TABLET | ORAL | Status: DC | PRN

## 2019-09-30 MED ORDER — MIDAZOLAM HCL 2 MG/2ML IJ SOLN
INTRAMUSCULAR | Status: AC
  Filled 2019-09-30: qty 2

## 2019-09-30 MED ORDER — PROPOFOL 10 MG/ML IV BOLUS
INTRAVENOUS | Status: DC | PRN
  Administered 2019-09-30: 110 mg via INTRAVENOUS

## 2019-09-30 MED ORDER — ONDANSETRON HCL 4 MG/2ML IJ SOLN
INTRAMUSCULAR | Status: DC | PRN
  Administered 2019-09-30: 4 mg via INTRAVENOUS

## 2019-09-30 SURGICAL SUPPLY — 65 items
APPLICATOR SURGIFLO ENDO (HEMOSTASIS) IMPLANT
BACTOSHIELD CHG 4% 4OZ (MISCELLANEOUS) ×1
BAG LAPAROSCOPIC 12 15 PORT 16 (BASKET) IMPLANT
BAG RETRIEVAL 12/15 (BASKET)
BLADE SURG SZ10 CARB STEEL (BLADE) IMPLANT
COVER BACK TABLE 60X90IN (DRAPES) ×2 IMPLANT
COVER TIP SHEARS 8 DVNC (MISCELLANEOUS) ×1 IMPLANT
COVER TIP SHEARS 8MM DA VINCI (MISCELLANEOUS) ×1
COVER WAND RF STERILE (DRAPES) IMPLANT
DECANTER SPIKE VIAL GLASS SM (MISCELLANEOUS) IMPLANT
DERMABOND ADVANCED (GAUZE/BANDAGES/DRESSINGS) ×1
DERMABOND ADVANCED .7 DNX12 (GAUZE/BANDAGES/DRESSINGS) ×1 IMPLANT
DRAPE ARM DVNC X/XI (DISPOSABLE) ×4 IMPLANT
DRAPE COLUMN DVNC XI (DISPOSABLE) ×1 IMPLANT
DRAPE DA VINCI XI ARM (DISPOSABLE) ×4
DRAPE DA VINCI XI COLUMN (DISPOSABLE) ×1
DRAPE SHEET LG 3/4 BI-LAMINATE (DRAPES) ×2 IMPLANT
DRAPE SURG IRRIG POUCH 19X23 (DRAPES) ×2 IMPLANT
DRSG OPSITE POSTOP 4X6 (GAUZE/BANDAGES/DRESSINGS) IMPLANT
DRSG OPSITE POSTOP 4X8 (GAUZE/BANDAGES/DRESSINGS) IMPLANT
DRSG TEGADERM 8X12 (GAUZE/BANDAGES/DRESSINGS) ×2 IMPLANT
ELECT REM PT RETURN 15FT ADLT (MISCELLANEOUS) ×2 IMPLANT
GLOVE BIO SURGEON STRL SZ 6 (GLOVE) ×8 IMPLANT
GLOVE BIO SURGEON STRL SZ 6.5 (GLOVE) IMPLANT
GOWN STRL REUS W/ TWL LRG LVL3 (GOWN DISPOSABLE) ×4 IMPLANT
GOWN STRL REUS W/TWL LRG LVL3 (GOWN DISPOSABLE) ×4
HOLDER FOLEY CATH W/STRAP (MISCELLANEOUS) ×2 IMPLANT
IRRIG SUCT STRYKERFLOW 2 WTIP (MISCELLANEOUS) ×2
IRRIGATION SUCT STRKRFLW 2 WTP (MISCELLANEOUS) ×1 IMPLANT
KIT PROCEDURE DA VINCI SI (MISCELLANEOUS)
KIT PROCEDURE DVNC SI (MISCELLANEOUS) IMPLANT
KIT TURNOVER KIT A (KITS) IMPLANT
MANIPULATOR UTERINE 4.5 ZUMI (MISCELLANEOUS) ×2 IMPLANT
NEEDLE HYPO 22GX1.5 SAFETY (NEEDLE) IMPLANT
NEEDLE SPNL 18GX3.5 QUINCKE PK (NEEDLE) IMPLANT
OBTURATOR OPTICAL STANDARD 8MM (TROCAR) ×1
OBTURATOR OPTICAL STND 8 DVNC (TROCAR) ×1
OBTURATOR OPTICALSTD 8 DVNC (TROCAR) ×1 IMPLANT
PACK ROBOT GYN CUSTOM WL (TRAY / TRAY PROCEDURE) ×2 IMPLANT
PAD POSITIONING PINK XL (MISCELLANEOUS) ×2 IMPLANT
PENCIL SMOKE EVACUATOR (MISCELLANEOUS) IMPLANT
PORT ACCESS TROCAR AIRSEAL 12 (TROCAR) ×1 IMPLANT
PORT ACCESS TROCAR AIRSEAL 5M (TROCAR) ×1
POUCH SPECIMEN RETRIEVAL 10MM (ENDOMECHANICALS) IMPLANT
SCRUB CHG 4% DYNA-HEX 4OZ (MISCELLANEOUS) ×1 IMPLANT
SEAL CANN UNIV 5-8 DVNC XI (MISCELLANEOUS) ×3 IMPLANT
SEAL XI 5MM-8MM UNIVERSAL (MISCELLANEOUS) ×3
SET TRI-LUMEN FLTR TB AIRSEAL (TUBING) ×2 IMPLANT
SPONGE LAP 18X18 X RAY DECT (DISPOSABLE) IMPLANT
SURGIFLO W/THROMBIN 8M KIT (HEMOSTASIS) IMPLANT
SUT MNCRL AB 4-0 PS2 18 (SUTURE) IMPLANT
SUT PDS AB 1 TP1 96 (SUTURE) IMPLANT
SUT VIC AB 0 CT1 27 (SUTURE) ×2
SUT VIC AB 0 CT1 27XBRD ANTBC (SUTURE) ×2 IMPLANT
SUT VIC AB 2-0 CT1 27 (SUTURE)
SUT VIC AB 2-0 CT1 TAPERPNT 27 (SUTURE) IMPLANT
SUT VICRYL 4-0 PS2 18IN ABS (SUTURE) ×4 IMPLANT
SYR 10ML LL (SYRINGE) IMPLANT
TOWEL OR NON WOVEN STRL DISP B (DISPOSABLE) ×2 IMPLANT
TRAP SPECIMEN MUCOUS 40CC (MISCELLANEOUS) IMPLANT
TRAY FOLEY MTR SLVR 16FR STAT (SET/KITS/TRAYS/PACK) ×2 IMPLANT
TROCAR XCEL NON-BLD 5MMX100MML (ENDOMECHANICALS) IMPLANT
UNDERPAD 30X36 HEAVY ABSORB (UNDERPADS AND DIAPERS) ×2 IMPLANT
WATER STERILE IRR 1000ML POUR (IV SOLUTION) ×2 IMPLANT
YANKAUER SUCT BULB TIP 10FT TU (MISCELLANEOUS) IMPLANT

## 2019-09-30 NOTE — Discharge Instructions (Signed)
09/30/2019  Return to work: 4 weeks  Activity: 1. Be up and out of the bed during the day.  Take a nap if needed.  You may walk up steps but be careful and use the hand rail.  Stair climbing will tire you more than you think, you may need to stop part way and rest.   2. No lifting or straining for 6 weeks.  3. No driving for 1 weeks.  Do Not drive if you are taking narcotic pain medicine.  4. Shower daily.  Use soap and water on your incision and pat dry; don't rub.   5. No sexual activity and nothing in the vagina for 8 weeks.  Medications:  - Take ibuprofen and tylenol first line for pain control. Take these regularly (every 6 hours) to decrease the build up of pain.  - If necessary, for severe pain not relieved by ibuprofen, take percocet.  - While taking percocet you should take sennakot every night to reduce the likelihood of constipation. If this causes diarrhea, stop its use.  Diet: 1. Low sodium Heart Healthy Diet is recommended.  2. It is safe to use a laxative if you have difficulty moving your bowels.   Wound Care: 1. Keep clean and dry.  Shower daily.  Reasons to call the Doctor:   Fever - Oral temperature greater than 100.4 degrees Fahrenheit  Foul-smelling vaginal discharge  Difficulty urinating  Nausea and vomiting  Increased pain at the site of the incision that is unrelieved with pain medicine.  Difficulty breathing with or without chest pain  New calf pain especially if only on one side  Sudden, continuing increased vaginal bleeding with or without clots.   Follow-up: 1. See Kelli Hale in 3-4 weeks.  Contacts: For questions or concerns you should contact:  Dr. Everitt Hale at [phone] After hours and on week-ends call [phone] and ask to speak to the physician on call for Gynecologic Oncology   After Your Surgery  The information in this section will tell you what to expect after your surgery, both during your stay and after you  leave. You will learn how to safely recover from your surgery. Write down any questions you have and be sure to ask your doctor or nurse.  What to Expect When you wake up after your surgery, you will be in the [facility] (PACU) or your recovery room. A nurse will be monitoring your body temperature, blood pressure, pulse, and oxygen levels. You may have a urinary catheter in your bladder to help monitor the amount of urine you are making. It should come out before you go home. You will also have compression boots on your lower legs to help your circulation. Your pain medication will be given through an IV line or in tablet form. If you are having pain, tell your nurse. Your nurse will tell you how to recover from your surgery. Below are examples of ways you can help yourself recover safely.  You will be encouraged to walk with the help of your nurse or physical therapist. We will give you medication to relieve pain. Walking helps reduce the risk for blood clots and pneumonia. It also helps to stimulate your bowels so they begin working again.  Use your incentive spirometer. This will help your lungs expand, which prevents pneumonia.   Commonly Asked Questions  Will I have pain after surgery? Yes, you will have some pain after your surgery, especially in the first few days.  Your doctor and nurse will ask you about your pain often. You will be given medication to manage your pain as needed. If your pain is not relieved, please tell your doctor or nurse. It is important to control your pain so you can cough, breathe deeply, use your incentive spirometer, and get out of bed and walk.  Will I be able to eat? Yes, you will be able to eat a regular diet or eat as tolerated. You should start with foods that are soft and easy to digest such as apple sauce and chicken noodle soup. Eat small meals frequently, and then advance to regular foods. If you experience bloating, gas, or cramps,  limit high-fiber foods, including whole grain breads and cereal, nuts, seeds, salads, fresh fruit, broccoli, cabbage, and cauliflower. Will I have pain when I am home? The length of time each person has pain or discomfort varies. You may still have some pain when you go home and will probably be taking pain medication. Follow the guidelines below.  Take your medications as directed and as needed.  Call your doctor if the medication prescribed for you doesn't relieve your pain.  Don't drive or drink alcohol while you're taking prescription pain medication.  As your incision heals, you will have less pain and need less pain medication. A mild pain reliever such as acetaminophen (Tylenol) or ibuprofen (Advil) will relieve aches and discomfort. However, large quantities of acetaminophen may be harmful to your liver. Don't take more acetaminophen than the amount directed on the bottle or as instructed by your doctor or nurse.  Pain medication should help you as you resume your normal activities. Take enough medication to do your exercises comfortably. Pain medication is most effective 30 to 45 minutes after taking it.  Keep track of when you take your pain medication. Taking it when your pain first begins is more effective than waiting for the pain to get worse. Pain medication may cause constipation (having fewer bowel movements than what is normal for you).  How can I prevent constipation?  Go to the bathroom at the same time every day. Your body will get used to going at that time.  If you feel the urge to go, don't put it off. Try to use the bathroom 5 to 15 minutes after meals.  After breakfast is a good time to move your bowels. The reflexes in your colon are strongest at this time.  Exercise, if you can. Walking is an excellent form of exercise.  Drink 8 (8-ounce) glasses (2 liters) of liquids daily, if you can. Drink water, juices, soups, ice cream shakes, and other drinks that don't  have caffeine. Drinks with caffeine, such as coffee and soda, pull fluid out of the body.  Slowly increase the fiber in your diet to 25 to 35 grams per day. Fruits, vegetables, whole grains, and cereals contain fiber. If you have an ostomy or have had recent bowel surgery, check with your doctor or nurse before making any changes in your diet.  Both over-the-counter and prescription medications are available to treat constipation. Start with 1 of the following over-the-counter medications first: o Docusate sodium (Colace) 100 mg. Take ___1__ capsules _2____ times a day. This is a stool softener that causes few side effects. Don't take it with mineral oil. o Polyethylene glycol (MiraLAX) 17 grams daily. o Senna (Senokot) 2 tablets at bedtime. This is a stimulant laxative, which can cause cramping.  If you haven't had a bowel movement in 2  days, call your doctor or nurse.  Can I shower? Yes, you should shower 24 hours after your surgery. Be sure to shower every day. Taking a warm shower is relaxing and can help decrease muscle aches. Use soap when you shower and gently wash your incision. Pat the areas dry with a towel after showering, and leave your incision uncovered (unless there is drainage). Call your doctor if you see any redness or drainage from your incision. Don't take tub baths until you discuss it with your doctor at the first appointment after your surgery. How do I care for my incisions? You will have several small incisions on your abdomen. The incisions are closed with Steri-Strips or Dermabond. You may also have square white dressings on your incisions (Primapore). You can remove these in the shower 24 hours after your surgery. You should clean your incisions with soap and water. If you go home with Steri-Strips on your incision, they will loosen and may fall off by themselves. If they haven't fallen off within 10 days, you can remove them. If you go home with Dermabond over your  sutures (stitches), it will also loosen and peel off.  What are the most common symptoms after a hysterectomy? It's common for you to have some vaginal spotting or light bleeding. You should monitor this with a pad or a panty liner. If you have having heavy bleeding (bleeding through a pad or liner every 1 to 2 hours), call your doctor right away. It's also common to have some discomfort after surgery from the air that was pumped into your abdomen during surgery. To help with this, walk, drink plenty of liquids and make sure to take the stool softeners you received.  When is it safe for me to drive? You may resume driving 2 weeks after surgery, as long as you are not taking pain medication that may make you drowsy.  When can I resume sexual activity? Do not place anything in your vagina or have vaginal intercourse for 8 weeks after your surgery. Some people will need to wait longer than 8 weeks, so speak with your doctor before resuming sexual intercourse.  Will I be able to travel? Yes, you can travel. If you are traveling by plane within a few weeks after your surgery, make sure you get up and walk every hour. Be sure to stretch your legs, drink plenty of liquids, and keep your feet elevated when possible.  Will I need any supplies? Most people do not need any supplies after the surgery. In the rare case that you do need supplies, such as tubes or drains, your nurse will order them for you.  When can I return to work? The time it takes to return to work depends on the type of work you do, the type of surgery you had, and how fast your body heals. Most people can return to work about 2 to 4 weeks after the surgery.  What exercises can I do? Exercise will help you gain strength and feel better. Walking and stair climbing are excellent forms of exercise. Gradually increase the distance you walk. Climb stairs slowly, resting or stopping as needed. Ask your doctor or nurse before starting more  strenuous exercises.  When can I lift heavy objects? Most people should not lift anything heavier than 10 pounds (4.5 kilograms) for at least 4 weeks after surgery. Speak with your doctor about when you can do heavy lifting.  How can I cope with my feelings? After surgery  for a serious illness, you may have new and upsetting feelings. Many people say they felt weepy, sad, worried, nervous, irritable, and angry at one time or another. You may find that you can't control some of these feelings. If this happens, it's a good idea to seek emotional support. The first step in coping is to talk about how you feel. Family and friends can help. Your nurse, doctor, and social worker can reassure, support, and guide you. It's always a good idea to let these professionals know how you, your family, and your friends are feeling emotionally. Many resources are available to patients and their families. Whether you're in the hospital or at home, the nurses, doctors, and social workers are here to help you and your family and friends handle the emotional aspects of your illness.  When is my first appointment after surgery? Your first appointment after surgery will be 2 to 4 weeks after surgery. Your nurse will give you instructions on how to make this appointment, including the phone number to call.  What if I have other questions? If you have any questions or concerns, please talk with your doctor or nurse. You can reach them Monday through Friday from 9:00 am to 5:00 pm. After 5:00 pm, during the weekend, and on holidays, call [phone] and ask for the doctor on call for your doctor.   Have a temperature of 101 F (38.3 C) or higher  Have pain that does not get better with pain medication  Have redness, drainage, or swelling from your incisions

## 2019-09-30 NOTE — H&P (Signed)
H&P Note: Gyn-Onc  Consult was requested by Dr. Quincy Simmonds for the evaluation of Kelli Hale 49 y.o. female  CC:  fibroids  Assessment/Plan:  Kelli. Kelli Hale  is a 49 y.o.  year old with a fibroid uterus that is symptomatic with pain, likely secondary to degeneration.   I reviewed the MRI images with the patient and her daughter.  I explained that I have an extremely low suspicion for malignancy given the appearance on MRI, the benign biopsy, and the fact that I believe that the growth in the fibroid has largely been from cystic degeneration and cystic growth rather than solid expansion.  However because the fibroids are symptomatic with pain and mass-effect on the bladder, I am recommending surgical excision with robotic assisted total hysterectomy for uterus greater than 250 g with bilateral salpingectomy.  She may require mini lap for specimen delivery.  I explained risks of the procedure with a translator present including  bleeding, infection, damage to internal organs (such as bladder,ureters, bowels), blood clot, reoperation and rehospitalization. I explained anticipated recovery.  This will be an outpatient procedure.  We will schedule the procedure for late January.   HPI: Kelli Hale is a 49 year old P3 who is seen in consultation at the request of Dr Quincy Simmonds for evaluation of a fibroid uterus.   The patient was evaluated for routine gynecologic evaluation in October 2020.  She had previously undergone an ultrasound in 2019 which had revealed a uterus containing a 3.6 x 2.9 cm posterior fibroid.  On physical examination the uterus was palpably larger and therefore she underwent an MRI of the pelvis on July 01, 2019.  This revealed a uterus measuring 13.4 x 8.6 x 10.6 cm.  Subserosal fibroid was seen in the right posterior corpus which measured 8.8 x 6.6 x 6.1 cm.  It showed peripheral contrast-enhancement and a large area of central cystic  degeneration.  There were several other small intramural and subserosal fibroids also seen in the upper uterine body and corpus measuring up to 2 cm in size.  The ovaries were normal bilaterally.  An endometrial biopsy was performed on June 21, 2019 which revealed a benign endometrial polyp.  She had undergone a hysteroscopic myomectomy in 2019 which had also revealed benign pathology.  The patient symptoms were significant for urinary frequency due to mass-effect, and intermittent central pelvic pain.  She denies menorrhagia.  She had regular cycles.  Her obstetric history is significant for 2 prior vaginal births and one cesarean section.  She is also had a tubal ligation.  She had no other prior abdominal surgeries.  She has no family history of malignancy.  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  She speaks Spanish only.  Current Meds:  Outpatient Encounter Medications as of 08/09/2019  Medication Sig  . omega-3 acid ethyl esters (LOVAZA) 1 g capsule Take by mouth daily.  . cyclobenzaprine (FLEXERIL) 10 MG tablet Take 1 tablet (10 mg total) by mouth at bedtime.  . ferrous sulfate (FERROUSUL) 325 (65 FE) MG tablet Take 1 tablet (325 mg total) by mouth 2 (two) times daily with a meal.  . levothyroxine (SYNTHROID) 100 MCG tablet Take 1 tablet (100 mcg total) by mouth daily before breakfast.  . Multiple Vitamin (MULTIVITAMIN) tablet Take 1 tablet by mouth daily.  . rosuvastatin (CRESTOR) 20 MG tablet Take 1 tablet (20 mg total) by mouth daily.   No facility-administered encounter medications on file as of 08/09/2019.  Allergy: No Known Allergies  Social Hx:   Social History   Socioeconomic History  . Marital status: Married    Spouse name: Not on file  . Number of children: 3  . Years of education: Not on file  . Highest education level: Not on file  Occupational History  . Not on file  Tobacco Use  . Smoking status: Never Smoker  . Smokeless tobacco:  Never Used  Substance and Sexual Activity  . Alcohol use: No  . Drug use: No  . Sexual activity: Yes    Birth control/protection: Surgical  Other Topics Concern  . Not on file  Social History Narrative  . Not on file   Social Determinants of Health   Financial Resource Strain:   . Difficulty of Paying Living Expenses: Not on file  Food Insecurity:   . Worried About Charity fundraiser in the Last Year: Not on file  . Ran Out of Food in the Last Year: Not on file  Transportation Needs:   . Lack of Transportation (Medical): Not on file  . Lack of Transportation (Non-Medical): Not on file  Physical Activity:   . Days of Exercise per Week: Not on file  . Minutes of Exercise per Session: Not on file  Stress:   . Feeling of Stress : Not on file  Social Connections:   . Frequency of Communication with Friends and Family: Not on file  . Frequency of Social Gatherings with Friends and Family: Not on file  . Attends Religious Services: Not on file  . Active Member of Clubs or Organizations: Not on file  . Attends Archivist Meetings: Not on file  . Marital Status: Not on file  Intimate Partner Violence:   . Fear of Current or Ex-Partner: Not on file  . Emotionally Abused: Not on file  . Physically Abused: Not on file  . Sexually Abused: Not on file    Past Surgical Hx:  Past Surgical History:  Procedure Laterality Date  . CESAREAN SECTION     x 1  . COLPOSCOPY    . DILATATION & CURETTAGE/HYSTEROSCOPY WITH MYOSURE N/A 07/28/2018   Procedure: DILATATION & CURETTAGE/HYSTEROSCOPY WITH MYOSURE;  Surgeon: Nunzio Cobbs, MD;  Location: [facility];  Service: Gynecology;  Laterality: N/A;  rep will be here confirmed on 07/27/18  . TUBAL LIGATION    . UPPER GASTROINTESTINAL ENDOSCOPY  01/16/2018    Past Medical Hx:  Past Medical History:  Diagnosis Date  . Allergy   . Anemia   . Constipation    occasionally- but daily or weekly   . Elevated cholesterol   .  Fibroid   . Hypothyroidism 2019  . Hypothyroidism 2019  . Thyroid disease 2005    Past Gynecological History:  See HPI Patient's last menstrual period was 09/24/2019 (exact date).  Family Hx:  Family History  Problem Relation Age of Onset  . Heart disease Mother   . Rectal cancer Neg Hx   . Stomach cancer Neg Hx   . Colon cancer Neg Hx   . Esophageal cancer Neg Hx   . Colon polyps Neg Hx   . Breast cancer Neg Hx     Review of Systems:  Constitutional  Feels well,    ENT Normal appearing ears and nares bilaterally Skin/Breast  No rash, sores, jaundice, itching, dryness Cardiovascular  No chest pain, shortness of breath, or edema  Pulmonary  No cough or wheeze.  Gastro Intestinal  No  nausea, vomitting, or diarrhoea. No bright red blood per rectum, no abdominal pain, change in bowel movement, or constipation.  Genito Urinary  No frequency, urgency, dysuria, pelvic pain (central)  Musculo Skeletal  No myalgia, arthralgia, joint swelling or pain  Neurologic  No weakness, numbness, change in gait,  Psychology  No depression, anxiety, insomnia.   Vitals:  Blood pressure 133/81, pulse (!) 56, temperature 98.7 F (37.1 C), temperature source Oral, resp. rate 14, height 5\' 5"  (1.651 m), weight 166 lb 0.1 oz (75.3 kg), last menstrual period 09/24/2019, SpO2 100 %.  Physical Exam: WD in NAD Neck  Supple NROM, without any enlargements.  Lymph Node Survey No cervical supraclavicular or inguinal adenopathy Cardiovascular  Pulse normal rate, regularity and rhythm. S1 and S2 normal.  Lungs  Clear to auscultation bilateraly, without wheezes/crackles/rhonchi. Good air movement.  Skin  No rash/lesions/breakdown  Psychiatry  Alert and oriented to person, place, and time  Abdomen  Normoactive bowel sounds, abdomen soft, non-tender and nonobese without evidence of hernia.  Back No CVA tenderness Genito Urinary  Vulva/vagina: Normal external female genitalia.  No lesions. No  discharge or bleeding.  Bladder/urethra:  No lesions or masses, well supported bladder  Vagina: normal  Cervix: Normal appearing, no lesions.  Uterus:  Bulky, mobile, fibroid feels at the fundus.  Adnexa: no palpable masses. Rectal  deferred Extremities  No bilateral cyanosis, clubbing or edema.   Thereasa Solo, MD  09/30/2019, 7:10 AM

## 2019-09-30 NOTE — Anesthesia Postprocedure Evaluation (Signed)
Anesthesia Post Note  Patient: Kelli Hale  Procedure(s) Performed: XI ROBOTIC ASSISTED TOTAL HYSTERECTOMY GREATER THAN 250 GRAMS (N/A ) XI ROBOTIC ASSISTED SALPINGECTOMY (N/A )     Patient location during evaluation: PACU Anesthesia Type: General Level of consciousness: awake and alert Pain management: pain level controlled Vital Signs Assessment: post-procedure vital signs reviewed and stable Respiratory status: spontaneous breathing, nonlabored ventilation, respiratory function stable and patient connected to nasal cannula oxygen Cardiovascular status: blood pressure returned to baseline and stable Postop Assessment: no apparent nausea or vomiting Anesthetic complications: no    Last Vitals:  Vitals:   09/30/19 1105 09/30/19 1415  BP: 115/80   Pulse: (!) 57 65  Resp: 14 14  Temp:    SpO2: 97% 97%    Last Pain:  Vitals:   09/30/19 1415  TempSrc:   PainSc: Asleep                 Chrysten Woulfe

## 2019-09-30 NOTE — Transfer of Care (Signed)
Immediate Anesthesia Transfer of Care Note  Patient: Kelli Hale  Procedure(s) Performed: XI ROBOTIC ASSISTED TOTAL HYSTERECTOMY GREATER THAN 250 GRAMS (N/A ) XI ROBOTIC ASSISTED SALPINGECTOMY (N/A )  Patient Location: PACU  Anesthesia Type:General  Level of Consciousness: drowsy  Airway & Oxygen Therapy: Patient Spontanous Breathing and Patient connected to face mask oxygen  Post-op Assessment: Report given to RN and Post -op Vital signs reviewed and stable  Post vital signs: Reviewed and stable  Last Vitals:  Vitals Value Taken Time  BP    Temp    Pulse 60 09/30/19 0942  Resp    SpO2 100 % 09/30/19 0942  Vitals shown include unvalidated device data.  Last Pain:  Vitals:   09/30/19 0611  TempSrc: Oral  PainSc:          Complications: No apparent anesthesia complications

## 2019-09-30 NOTE — Anesthesia Procedure Notes (Signed)
Procedure Name: Intubation Date/Time: 09/30/2019 7:39 AM Performed by: Sharlette Dense, CRNA Patient Re-evaluated:Patient Re-evaluated prior to induction Oxygen Delivery Method: Circle system utilized Preoxygenation: Pre-oxygenation with 100% oxygen Induction Type: IV induction Ventilation: Mask ventilation without difficulty and Oral airway inserted - appropriate to patient size Laryngoscope Size: Sabra Heck and 2 Grade View: Grade I Tube type: Oral Tube size: 7.5 mm Number of attempts: 1 Airway Equipment and Method: Stylet Placement Confirmation: ETT inserted through vocal cords under direct vision,  positive ETCO2 and breath sounds checked- equal and bilateral Secured at: 22 cm Tube secured with: Tape Dental Injury: Teeth and Oropharynx as per pre-operative assessment

## 2019-09-30 NOTE — Op Note (Addendum)
OPERATIVE NOTE 09/30/19  Surgeon: Donaciano Eva   Assistants: Dr Lahoma Crocker (an MD assistant was necessary for tissue manipulation, management of robotic instrumentation, retraction and positioning due to the complexity of the case and hospital policies).   Anesthesia: General endotracheal anesthesia  ASA Class: 3   Pre-operative Diagnosis: rapidly enlarging fibroids  Post-operative Diagnosis: fibroids  Operation: Robotic-assisted laparoscopic total hysterectomy >250gm with bilateral salpingectomy   Surgeon: Donaciano Eva  Assistant Surgeon: Lahoma Crocker MD  Anesthesia: GET  Urine Output: 150cc  Operative Findings:  : 14cm bulky fibroid (intramural) uterus, normal appearing ovaries. Benign fibroid on frozen section  Estimated Blood Loss:  17mL      Total IV Fluids: 900 ml         Specimens: uterus with cervix and bilateral fallopian tubes         Complications:  None; patient tolerated the procedure well.         Disposition: PACU - hemodynamically stable.  Procedure Details  The patient was seen in the Holding Room. The risks, benefits, complications, treatment options, and expected outcomes were discussed with the patient.  The patient concurred with the proposed plan, giving informed consent.  The site of surgery properly noted/marked. The patient was identified as Kelli Hale and the procedure verified as a Robotic-assisted hysterectomy with bilateral salpingo oophorectomy. A Time Out was held and the above information confirmed.  After induction of anesthesia, the patient was draped and prepped in the usual sterile manner. Pt was placed in supine position after anesthesia and draped and prepped in the usual sterile manner. The abdominal drape was placed after the CholoraPrep had been allowed to dry for 3 minutes.  Her arms were tucked to her side with all appropriate precautions.  The shoulders were stabilized with padded shoulder  blocks applied to the acromium processes.  The patient was placed in the semi-lithotomy position in Adm[place].  The perineum was prepped with Betadine. The patient was then prepped. Foley catheter was placed.  A sterile speculum was placed in the vagina.  The cervix was grasped with a single-tooth tenaculum and dilated with Kennon Rounds dilators.  The ZUMI uterine manipulator with a medium colpotomizer ring was placed without difficulty.  A pneum occluder balloon was placed over the manipulator.  OG tube placement was confirmed and to suction.   Next, a 5 mm skin incision was made 1 cm below the subcostal margin in the midclavicular line.  The 5 mm Optiview port and scope was used for direct entry.  Opening pressure was under 10 mm CO2.  The abdomen was insufflated and the findings were noted as above.   At this point and all points during the procedure, the patient's intra-abdominal pressure did not exceed 15 mmHg. Next, a 10 mm skin incision was made in the umbilicus and a right and left port was placed about 10 cm lateral to the robot port on the right and left side.  All ports were placed under direct visualization.  The patient was placed in steep Trendelenburg.  Bowel was folded away into the upper abdomen.  The robot was docked in the normal manner.  The hysterectomy was started after the round ligament on the right side was incised and the retroperitoneum was entered and the pararectal space was developed.  The ureter was noted to be on the medial leaf of the broad ligament.  The peritoneum above the ureter was incised and stretched, the fallopian tube was dissected off of  the ovary and the utero-ovarian ligament was skeletonized, cauterized and cut.  The posterior peritoneum was taken down to the level of the KOH ring.  The anterior peritoneum was also taken down.  The bladder flap was created to the level of the KOH ring.  The uterine artery on the right side was skeletonized, cauterized and cut in the  normal manner.  A similar procedure was performed on the left.  The colpotomy was made and the uterus, cervix, bilateral tubes were amputated and delivered through the vagina. In order to deliver the enlarged uterus it was incised but not morcellated.  Pedicles were inspected and excellent hemostasis was achieved.    The colpotomy at the vaginal cuff was closed with Vicryl on a CT1 needle in a running manner.  Irrigation was used and excellent hemostasis was achieved.  At this point in the procedure was completed.  Robotic instruments were removed under direct visulaization.  The robot was undocked. The 10 mm ports were closed with Vicryl on a UR-5 needle and the fascia was closed with 0 Vicryl on a UR-5 needle.  The skin was closed with 4-0 Vicryl in a subcuticular manner.  Dermabond was applied.  Sponge, lap and needle counts correct x 2.  The patient was taken to the recovery room in stable condition.  The vagina was swabbed with  minimal bleeding noted.   All instrument and needle counts were correct x  3.   The patient was transferred to the recovery room in a stable condition.  Donaciano Eva, MD

## 2019-10-01 ENCOUNTER — Telehealth: Payer: Self-pay

## 2019-10-01 ENCOUNTER — Encounter: Payer: Self-pay | Admitting: *Deleted

## 2019-10-01 LAB — URINE CULTURE

## 2019-10-01 LAB — SURGICAL PATHOLOGY

## 2019-10-01 NOTE — Telephone Encounter (Signed)
Spoke with Daughter Kelli Hale. Kelli Hale is doing better today. She was vomiting last evening at home after surgery. No N/V today. She is eating, drinking, and urinating well. Told daughter that she should increase her diet to soft foods since the waffle stayed down this am. No BM or passing gas. She will begin the Senokot -S this afternoon when she wakes up and take it bid. If no flatus or BM by tomorrow afternoon, suggested adding Miralax 1 capful bid. If pt experiencing, severe abdominal pain with N/V, the daughter needs to call the office as her bowels may be asleep and not working. She has taken Ibuprofen 800 mg x 2 this am.  Suggested alt with Tylenol 500 mg at 4 hrs mark to help with pain control. She can take 1/2 to 1 Oxycodone  If pain level is 5 or greater on a scale of 0-10 and pt not due for other medications. Afebrile. Incisions are D&I. Encouraged daughter to increase her mother's ambulation. Daughter aware of office number to call. [phone] and of the post op appointment on 10-29-19 at 1515.

## 2019-10-04 ENCOUNTER — Telehealth: Payer: Self-pay

## 2019-10-04 NOTE — Telephone Encounter (Signed)
Told daughter [name] that the final pathology report showed no cancer per Joylene John, NP

## 2019-10-15 ENCOUNTER — Ambulatory Visit: Payer: BC Managed Care – PPO | Admitting: Gynecologic Oncology

## 2019-10-19 DIAGNOSIS — M546 Pain in thoracic spine: Secondary | ICD-10-CM | POA: Diagnosis not present

## 2019-10-19 DIAGNOSIS — M542 Cervicalgia: Secondary | ICD-10-CM | POA: Diagnosis not present

## 2019-10-19 DIAGNOSIS — M2560 Stiffness of unspecified joint, not elsewhere classified: Secondary | ICD-10-CM | POA: Diagnosis not present

## 2019-10-19 DIAGNOSIS — M6281 Muscle weakness (generalized): Secondary | ICD-10-CM | POA: Diagnosis not present

## 2019-10-29 ENCOUNTER — Encounter: Payer: Self-pay | Admitting: Gynecologic Oncology

## 2019-10-29 ENCOUNTER — Inpatient Hospital Stay: Payer: BC Managed Care – PPO | Attending: Gynecologic Oncology | Admitting: Gynecologic Oncology

## 2019-10-29 VITALS — BP 136/80 | HR 65 | Temp 98.7°F | Resp 18 | Ht 64.0 in | Wt 164.2 lb

## 2019-10-29 DIAGNOSIS — Z90722 Acquired absence of ovaries, bilateral: Secondary | ICD-10-CM

## 2019-10-29 DIAGNOSIS — Z90710 Acquired absence of both cervix and uterus: Secondary | ICD-10-CM

## 2019-10-29 DIAGNOSIS — D251 Intramural leiomyoma of uterus: Secondary | ICD-10-CM

## 2019-10-29 NOTE — Progress Notes (Signed)
Postop Follow-up Note: Gyn-Onc  Consult was requested by Dr. Quincy Simmonds for the evaluation of Kelli Hale 49 y.o. female  CC:  Chief Complaint  Patient presents with  . Fibroids    postop check    Assessment/Plan:  Kelli Hale  is a 49 y.o.  year old who is 4 weeks s/p robotic assisted total hysterectomy and bilateral salpingectomy on September 30, 2019. She is doing well postop with no issues.  I recommend annual wellness care with her PCP and Dr Quincy Simmonds.   HPI: Kelli Hale is a 49 year old P3 who is seen in consultation at the request of Dr Quincy Simmonds for evaluation of a fibroid uterus.   The patient was evaluated for routine gynecologic evaluation in October 2020.  She had previously undergone an ultrasound in 2019 which had revealed a uterus containing a 3.6 x 2.9 cm posterior fibroid.  On physical examination the uterus was palpably larger and therefore she underwent an MRI of the pelvis on July 01, 2019.  This revealed a uterus measuring 13.4 x 8.6 x 10.6 cm.  Subserosal fibroid was seen in the right posterior corpus which measured 8.8 x 6.6 x 6.1 cm.  It showed peripheral contrast-enhancement and a large area of central cystic degeneration.  There were several other small intramural and subserosal fibroids also seen in the upper uterine body and corpus measuring up to 2 cm in size.  The ovaries were normal bilaterally.  An endometrial biopsy was performed on June 21, 2019 which revealed a benign endometrial polyp.  She had undergone a hysteroscopic myomectomy in 2019 which had also revealed benign pathology.  The patient symptoms were significant for urinary frequency due to mass-effect, and intermittent central pelvic pain.  She denies menorrhagia.  She had regular cycles.  Her obstetric history is significant for 2 prior vaginal births and one cesarean section.  She is also had a tubal ligation.  She had no other prior abdominal surgeries.  She  has no family history of malignancy.  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  She speaks Spanish only.  Interval Hx:  On September 30, 2019 she underwent robotic assisted total hysterectomy for uterus greater than 250 g with bilateral salpingectomy. Intraoperative findings were significant for a 14 cm bulky fibroid uterus with normal-appearing ovaries bilaterally.  Benign fibroid on frozen section.. Surgery was uncomplicated.  Final pathology revealed proliferative endometrium, no hyperplasia or malignancy, benign fibroids, benign cervix, benign fallopian tubes.  Since surgery she has done well with minimal complaints.  Current Meds:  Outpatient Encounter Medications as of 10/29/2019  Medication Sig  . acetaminophen (TYLENOL) 500 MG tablet Take 500 mg by mouth every 6 (six) hours as needed for moderate pain or headache.  . ibuprofen (ADVIL) 800 MG tablet Take 1 tablet (800 mg total) by mouth every 8 (eight) hours as needed for moderate pain. For AFTER surgery only  . levothyroxine (SYNTHROID) 100 MCG tablet Take 1 tablet (100 mcg total) by mouth daily before breakfast.  . Multiple Vitamin (MULTIVITAMIN) tablet Take 1 tablet by mouth daily.  . rosuvastatin (CRESTOR) 20 MG tablet Take 1 tablet (20 mg total) by mouth daily.  . Calcium Carbonate-Vitamin D (CALTRATE 600+D PO) Take 1 tablet by mouth daily.  . Ferrous Sulfate (IRON PO) Take 1 tablet by mouth daily.  . [DISCONTINUED] oxyCODONE (OXY IR/ROXICODONE) 5 MG immediate release tablet Take 1 tablet (5 mg total) by mouth every 4 (four) hours as needed  for severe pain. For AFTER surgery only, do not take and drive (Patient not taking: Reported on 08/18/2019)  . [DISCONTINUED] senna-docusate (SENOKOT-S) 8.6-50 MG tablet Take 2 tablets by mouth at bedtime. For AFTER surgery, do not take if having diarrhea (Patient not taking: Reported on 08/18/2019)   No facility-administered encounter medications on file as of 10/29/2019.     Allergy: No Known Allergies  Social Hx:   Social History   Socioeconomic History  . Marital status: Married    Spouse name: Not on file  . Number of children: 3  . Years of education: Not on file  . Highest education level: Not on file  Occupational History  . Not on file  Tobacco Use  . Smoking status: Never Smoker  . Smokeless tobacco: Never Used  Substance and Sexual Activity  . Alcohol use: No  . Drug use: No  . Sexual activity: Yes    Birth control/protection: Surgical  Other Topics Concern  . Not on file  Social History Narrative  . Not on file   Social Determinants of Health   Financial Resource Strain:   . Difficulty of Paying Living Expenses: Not on file  Food Insecurity:   . Worried About Charity fundraiser in the Last Year: Not on file  . Ran Out of Food in the Last Year: Not on file  Transportation Needs:   . Lack of Transportation (Medical): Not on file  . Lack of Transportation (Non-Medical): Not on file  Physical Activity:   . Days of Exercise per Week: Not on file  . Minutes of Exercise per Session: Not on file  Stress:   . Feeling of Stress : Not on file  Social Connections:   . Frequency of Communication with Friends and Family: Not on file  . Frequency of Social Gatherings with Friends and Family: Not on file  . Attends Religious Services: Not on file  . Active Member of Clubs or Organizations: Not on file  . Attends Archivist Meetings: Not on file  . Marital Status: Not on file  Intimate Partner Violence:   . Fear of Current or Ex-Partner: Not on file  . Emotionally Abused: Not on file  . Physically Abused: Not on file  . Sexually Abused: Not on file    Past Surgical Hx:  Past Surgical History:  Procedure Laterality Date  . CESAREAN SECTION     x 1  . COLPOSCOPY    . DILATATION & CURETTAGE/HYSTEROSCOPY WITH MYOSURE N/A 07/28/2018   Procedure: DILATATION & CURETTAGE/HYSTEROSCOPY WITH MYOSURE;  Surgeon: Nunzio Cobbs, MD;  Location: [facility];  Service: Gynecology;  Laterality: N/A;  rep will be here confirmed on 07/27/18  . ROBOTIC ASSISTED TOTAL HYSTERECTOMY N/A 09/30/2019   Procedure: XI ROBOTIC ASSISTED TOTAL HYSTERECTOMY GREATER THAN 250 GRAMS;  Surgeon: Everitt Amber, MD;  Location: [facility];  Service: Gynecology;  Laterality: N/A;  . TUBAL LIGATION    . UPPER GASTROINTESTINAL ENDOSCOPY  01/16/2018  . XI ROBOTIC ASSISTED SALPINGECTOMY N/A 09/30/2019   Procedure: XI ROBOTIC ASSISTED SALPINGECTOMY;  Surgeon: Everitt Amber, MD;  Location: [facility];  Service: Gynecology;  Laterality: N/A;    Past Medical Hx:  Past Medical History:  Diagnosis Date  . Allergy   . Anemia   . Constipation    occasionally- but daily or weekly   . Elevated cholesterol   . Fibroid   . Hypothyroidism 2019  . Hypothyroidism 2019  . Thyroid disease 2005  Past Gynecological History:  See HPI No LMP recorded.  Family Hx:  Family History  Problem Relation Age of Onset  . Heart disease Mother   . Rectal cancer Neg Hx   . Stomach cancer Neg Hx   . Colon cancer Neg Hx   . Esophageal cancer Neg Hx   . Colon polyps Neg Hx   . Breast cancer Neg Hx     Review of Systems:  Constitutional  Feels well,    ENT Normal appearing ears and nares bilaterally Skin/Breast  No rash, sores, jaundice, itching, dryness Cardiovascular  No chest pain, shortness of breath, or edema  Pulmonary  No cough or wheeze.  Gastro Intestinal  No nausea, vomitting, or diarrhoea. No bright red blood per rectum, no abdominal pain, change in bowel movement, or constipation.  Genito Urinary  No frequency, urgency, dysuria, pelvic pain (central)  Musculo Skeletal  No myalgia, arthralgia, joint swelling or pain  Neurologic  No weakness, numbness, change in gait,  Psychology  No depression, anxiety, insomnia.   Vitals:  Blood pressure 136/80, pulse 65, temperature 98.7 F (37.1 C), temperature source Temporal, resp. rate 18, height 5'  4" (1.626 m), weight 164 lb 4 oz (74.5 kg), SpO2 100 %.  Physical Exam: WD in NAD Neck  Supple NROM, without any enlargements.  Lymph Node Survey No cervical supraclavicular or inguinal adenopathy Cardiovascular  Pulse normal rate, regularity and rhythm. S1 and S2 normal.  Lungs  Clear to auscultation bilateraly, without wheezes/crackles/rhonchi. Good air movement.  Skin  No rash/lesions/breakdown  Psychiatry  Alert and oriented to person, place, and time  Abdomen  Normoactive bowel sounds, abdomen soft, non-tender and nonobese without evidence of hernia. Incisions well-healed x4. Back No CVA tenderness Genito Urinary  Vagina cuff: Smooth, suture material in place, healing normally, no defects, no blood. Rectal  deferred Extremities  No bilateral cyanosis, clubbing or edema.   Thereasa Solo, MD  10/29/2019, 3:43 PM

## 2019-10-29 NOTE — Patient Instructions (Signed)
You are healing normally from your hysterectomy.  Please avoid intercourse for another month until you are at least 2 months after surgery.  You are cleared to return to work with no restrictions including no weight restrictions.  You do not need to follow-up with Dr. Denman George unless you have a specific concern regarding your surgery.  Please follow-up with your primary care provider Dr. Mitchel Honour and Dr. Quincy Simmonds annually for wellness care as needed.

## 2020-03-02 ENCOUNTER — Ambulatory Visit (INDEPENDENT_AMBULATORY_CARE_PROVIDER_SITE_OTHER): Payer: BC Managed Care – PPO | Admitting: Emergency Medicine

## 2020-03-02 ENCOUNTER — Encounter: Payer: Self-pay | Admitting: Emergency Medicine

## 2020-03-02 ENCOUNTER — Other Ambulatory Visit: Payer: Self-pay

## 2020-03-02 VITALS — BP 123/78 | HR 68 | Temp 98.2°F | Ht 65.0 in | Wt 161.6 lb

## 2020-03-02 DIAGNOSIS — E039 Hypothyroidism, unspecified: Secondary | ICD-10-CM

## 2020-03-02 DIAGNOSIS — R531 Weakness: Secondary | ICD-10-CM | POA: Diagnosis not present

## 2020-03-02 DIAGNOSIS — E785 Hyperlipidemia, unspecified: Secondary | ICD-10-CM

## 2020-03-02 DIAGNOSIS — R21 Rash and other nonspecific skin eruption: Secondary | ICD-10-CM

## 2020-03-02 MED ORDER — TRIAMCINOLONE ACETONIDE 0.1 % EX CREA: 1.0000 "application " | TOPICAL_CREAM | Freq: Two times a day (BID) | CUTANEOUS | 2 refills | Status: DC

## 2020-03-02 NOTE — Patient Instructions (Addendum)
If you have lab work done today you will be contacted with your lab results within the next 2 weeks.  If you have not heard from [place] then please contact us. The fastest way to get your results is to register for My Chart.   IF you received an x-ray today, you will receive an invoice from [facility]. Please contact [facility] at [phone] with questions or concerns regarding your invoice.   IF you received labwork today, you will receive an invoice from [facility]. Please contact [facility] at [phone] with questions or concerns regarding your invoice.   Our billing staff will not be able to assist you with questions regarding bills from these companies.  You will be contacted with the lab results as soon as they are available. The fastest way to get your results is to activate your My Chart account. Instructions are located on the last page of this paperwork. If you have not heard from [place] regarding the results in 2 weeks, please contact this office.      Erupcin cutnea en los adultos Rash, Adult  Una erupcin es un cambio en el color de la piel. Una erupcin tambin puede cambiar la forma en que se siente la piel. Hay muchas afecciones y [employer] que pueden causar una erupcin. Siga estas indicaciones en su casa: El objetivo del tratamiento es calmar la picazn y evitar que la erupcin se propague. Controle si hay algn cambio en sus sntomas. Informe a su mdico acerca de los cambios. Estas indicaciones pueden ayudarlo con la afeccin: Medicamentos [place] o aplique los medicamentos de venta libre y los recetados solamente como se lo haya indicado el mdico. Esto puede incluir medicamentos:  Para tratar la piel enrojecida o hinchada (cremas con corticoesteroides).  Para tratar la picazn.  Para tratar Obie Dredge (antihistamnicos por va oral).  Para tratar sntomas muy graves (corticoesteroides por va oral).  Cuidado de la piel  Coloque  paos fros (compresas) en las zonas afectadas.  No se rasque ni se refriegue la piel.  Evite cubrir la erupcin. Asegrese de que la erupcin est expuesta al aire todo lo posible. Control de la picazn y Tyler calientes. Estos pueden empeorar la picazn. Ardelia Mems ducha fra puede Runner, broadcasting/film/video.  Trate de tomar un bao con lo siguiente: ? Sales de [place]. Puede conseguirlas en la tienda de Mucarabones indicaciones del envase. ? Bicarbonato de sodio. Vierta un poco en la baera como se lo haya indicado el mdico. ? Avena coloidal. Puede conseguirla en la tienda de comestibles o la farmacia local. V[place]leave indicaciones del envase.  Intente colocarse una pasta de bicarbonato de [employer]. Agregue agua al bicarbonato de sodio hasta que se forme una pasta.  Intente aplicarse una locin que [employer] picazn (locin de calamina).  Mantngase fresco y al resguardo del sol. La transpiracin y el calor pueden empeorar la picazn. Indicaciones generales   Descanse todo lo que sea necesario.  Beba suficiente lquido para Contractor pis (la orina) de color amarillo plido.  Use ropa holgada.  Evite los detergentes y los jabones perfumados, y los perfumes. Utilice jabones, detergentes, perfumes y cosmticos suaves.  Evite todo lo que le cause erupcin cutnea. Lleve un diario como ayuda para registrar lo que le causa erupcin. Escriba los siguientes datos: ? Lo que come. ? Los cosmticos que [place]. ? Lo que bebe. ?  La ropa que [place]. [place] alhajas.  Concurra a todas las visitas de seguimiento como se lo haya indicado el mdico. Esto es importante. Comunquese con un mdico si:  Transpira de noche.  Pierde peso.  Orina ms de lo normal.  Orina menos de lo normal u observa que la orina es de color ms oscuro que lo normal.  Se siente dbil.  Vomita.  Tiene un color amarillo en la piel o en  las partes blancas del ojo (ictericia).  La piel: ? Hormiguea. ? Se adormece.  La erupcin cutnea: ? No desaparece despus de [employer]. ? Empeora.  Usted: ? Est ms sediento que lo habitual. ? Est ms cansado que lo habitual.  Tiene los siguientes sntomas: ? Sntomas nuevos. ? Dolor en el vientre (abdomen). ? Fiebre. ? Materia fecal lquida (diarrea). Solicite ayuda inmediatamente si:  Jaclynn Guarneri, y los sntomas empeoran repentinamente.  Empieza a sentirse desorientado (confundido).  Siente un dolor de cabeza intenso o tiene rigidez en el cuello.  Siente mucho dolor o rigidez en las articulaciones.  Tiene una crisis de movimientos que no puede controlar (convulsiones).  La erupcin cubre todo el cuerpo o la mayor parte de [place]. La erupcin puede o no ser dolorosa.  Tiene ampollas con las siguientes caractersticas: ? Se encuentran arriba de la erupcin. ? Se agrandan. ? Crecen juntas. ? Son dolorosas. ? Estn dentro de la nariz o la boca.  Tiene una erupcin cutnea con estas caractersticas: ? Tiene pequeas manchas moradas, como si fueran pinchazos, en todo el cuerpo. ? Tiene un "ojo de buey" o se parece a un blanco de tiro. ? Est enrojecida y le duele, le produce descamacin de la piel y no se relaciona con haber estado mucho tiempo bajo el sol. Resumen  Una erupcin es un cambio en el color de la piel. Una erupcin tambin puede cambiar la forma en que se siente la piel.  El objetivo del tratamiento es calmar la picazn y evitar que la erupcin se propague.  Tome o [employer] medicamentos de venta libre y los recetados solamente como se lo haya indicado el mdico.  Comunquese con un mdico de inmediato si tiene sntomas nuevos o si sus sntomas empeoran.  Concurra a todas las visitas de seguimiento como se lo haya indicado el mdico. Esto es importante. Esta informacin no tiene Marine scientist el consejo del mdico. Asegrese de hacerle al  mdico cualquier pregunta que tenga. Document Revised: 04/30/2018 Document Reviewed: 04/30/2018 Elsevier Patient Education  2020 [employer].

## 2020-03-02 NOTE — Progress Notes (Signed)
Kelli Hale 49 y.o.   Chief Complaint  Patient presents with  . neck rash and itchy    going 1 week (both sides)  . Neck Pain    started last sunday.    HISTORY OF PRESENT ILLNESS: This is a 49 y.o. female complaining of rash to her neck that started last Sunday, 5 days ago.  Initially very itchy and now almost gone. No other associated symptoms. Has history of hypothyroidism on Synthroid 100 mcg daily. Has history of dyslipidemia, on Crestor 20 mg daily.  Needs to have fasting lipid profile done.  Not fasting today. Also complaining of general weakness without any other significant symptoms.  HPI   Prior to Admission medications   Medication Sig Start Date End Date Taking? Authorizing Provider  levothyroxine (SYNTHROID) 100 MCG tablet Take 1 tablet (100 mcg total) by mouth daily before breakfast. 06/09/19  Yes Nunzio Cobbs, MD  rosuvastatin (CRESTOR) 20 MG tablet Take 1 tablet (20 mg total) by mouth daily. 07/01/19  Yes SagardiaInes Bloomer, MD    No Known Allergies  Patient Active Problem List   Diagnosis Date Noted  . Dyslipidemia 08/18/2019  . Fibroids, intramural 08/09/2019  . Hypothyroidism 06/28/2018  . Fibroid 06/04/2018    Past Medical History:  Diagnosis Date  . Allergy   . Anemia   . Constipation    occasionally- but daily or weekly   . Elevated cholesterol   . Fibroid   . Hypothyroidism 2019  . Hypothyroidism 2019  . Thyroid disease 2005    Past Surgical History:  Procedure Laterality Date  . CESAREAN SECTION     x 1  . COLPOSCOPY    . DILATATION & CURETTAGE/HYSTEROSCOPY WITH MYOSURE N/A 07/28/2018   Procedure: DILATATION & CURETTAGE/HYSTEROSCOPY WITH MYOSURE;  Surgeon: Nunzio Cobbs, MD;  Location: [facility];  Service: Gynecology;  Laterality: N/A;  rep will be here confirmed on 07/27/18  . ROBOTIC ASSISTED TOTAL HYSTERECTOMY N/A 09/30/2019   Procedure: XI ROBOTIC ASSISTED TOTAL HYSTERECTOMY GREATER THAN 250  GRAMS;  Surgeon: Everitt Amber, MD;  Location: [facility];  Service: Gynecology;  Laterality: N/A;  . TUBAL LIGATION    . UPPER GASTROINTESTINAL ENDOSCOPY  01/16/2018  . XI ROBOTIC ASSISTED SALPINGECTOMY N/A 09/30/2019   Procedure: XI ROBOTIC ASSISTED SALPINGECTOMY;  Surgeon: Everitt Amber, MD;  Location: [facility];  Service: Gynecology;  Laterality: N/A;    Social History   Socioeconomic History  . Marital status: Married    Spouse name: Not on file  . Number of children: 3  . Years of education: Not on file  . Highest education level: Not on file  Occupational History  . Not on file  Tobacco Use  . Smoking status: Never Smoker  . Smokeless tobacco: Never Used  Vaping Use  . Vaping Use: Never used  Substance and Sexual Activity  . Alcohol use: No  . Drug use: No  . Sexual activity: Yes    Birth control/protection: Surgical  Other Topics Concern  . Not on file  Social History Narrative  . Not on file   Social Determinants of Health   Financial Resource Strain:   . Difficulty of Paying Living Expenses:   Food Insecurity:   . Worried About Charity fundraiser in the Last Year:   . Arboriculturist in the Last Year:   Transportation Needs:   . Film/video editor (Medical):   Marland Kitchen Lack of Transportation (Non-Medical):  Physical Activity:   . Days of Exercise per Week:   . Minutes of Exercise per Session:   Stress:   . Feeling of Stress :   Social Connections:   . Frequency of Communication with Friends and Family:   . Frequency of Social Gatherings with Friends and Family:   . Attends Religious Services:   . Active Member of Clubs or Organizations:   . Attends Archivist Meetings:   Marland Kitchen Marital Status:   Intimate Partner Violence:   . Fear of Current or Ex-Partner:   . Emotionally Abused:   Marland Kitchen Physically Abused:   . Sexually Abused:     Family History  Problem Relation Age of Onset  . Heart disease Mother   . Rectal cancer Neg Hx   . Stomach cancer Neg Hx   .  Colon cancer Neg Hx   . Esophageal cancer Neg Hx   . Colon polyps Neg Hx   . Breast cancer Neg Hx      Review of Systems  Constitutional: Negative.  Negative for chills and fever.  HENT: Negative.  Negative for congestion and sore throat.   Respiratory: Negative.  Negative for cough and shortness of breath.   Cardiovascular: Negative.  Negative for chest pain and palpitations.  Gastrointestinal: Negative for abdominal pain, blood in stool, melena, nausea and vomiting.  Genitourinary: Negative.  Negative for dysuria and hematuria.  Musculoskeletal: Negative.  Negative for myalgias and neck pain.  Skin: Positive for itching and rash.  Neurological: Positive for weakness.  All other systems reviewed and are negative.  Today's Vitals   03/02/20 1532  BP: 123/78  Pulse: 68  Temp: 98.2 F (36.8 C)  TempSrc: Temporal  SpO2: 95%  Weight: 161 lb 9.6 oz (73.3 kg)  Height: 5\' 5"  (1.651 m)   Body mass index is 26.89 kg/m.   Physical Exam Vitals reviewed.  Constitutional:      Appearance: Normal appearance.  HENT:     Head: Normocephalic.     Mouth/Throat:     Mouth: Mucous membranes are moist.     Pharynx: Oropharynx is clear.  Eyes:     Extraocular Movements: Extraocular movements intact.  Cardiovascular:     Rate and Rhythm: Normal rate and regular rhythm.     Pulses: Normal pulses.     Heart sounds: Normal heart sounds.  Pulmonary:     Effort: Pulmonary effort is normal.     Breath sounds: Normal breath sounds.  Musculoskeletal:        General: Normal range of motion.     Cervical back: Normal range of motion and neck supple.  Skin:    General: Skin is warm and dry.     Capillary Refill: Capillary refill takes less than 2 seconds.     Findings: No rash.  Neurological:     General: No focal deficit present.     Mental Status: She is alert and oriented to person, place, and time.         ASSESSMENT & PLAN: [name] was seen today for neck rash and itchy and  neck pain.  Diagnoses and all orders for this visit:  Rash and nonspecific skin eruption -     triamcinolone cream (KENALOG) 0.1 %; Apply 1 application topically 2 (two) times daily.  Dyslipidemia -     Lipid panel; Future  Hypothyroidism, unspecified type -     TSH; Future  General weakness -     Comprehensive metabolic panel; Future -  CBC with Differential/Platelet; Future    Patient Instructions       If you have lab work done today you will be contacted with your lab results within the next 2 weeks.  If you have not heard from [place] then please contact us. The fastest way to get your results is to register for My Chart.   IF you received an x-ray today, you will receive an invoice from [facility]. Please contact [facility] at [phone] with questions or concerns regarding your invoice.   IF you received labwork today, you will receive an invoice from [facility]. Please contact [facility] at [phone] with questions or concerns regarding your invoice.   Our billing staff will not be able to assist you with questions regarding bills from these companies.  You will be contacted with the lab results as soon as they are available. The fastest way to get your results is to activate your My Chart account. Instructions are located on the last page of this paperwork. If you have not heard from [place] regarding the results in 2 weeks, please contact this office.      Erupcin cutnea en los adultos Rash, Adult  Una erupcin es un cambio en el color de la piel. Una erupcin tambin puede cambiar la forma en que se siente la piel. Hay muchas afecciones y [employer] que pueden causar una erupcin. Siga estas indicaciones en su casa: El objetivo del tratamiento es calmar la picazn y evitar que la erupcin se propague. Controle si hay algn cambio en sus sntomas. Informe a su mdico acerca de los cambios. Estas indicaciones pueden ayudarlo con la  afeccin: Medicamentos [place]ome o aplique los medicamentos de venta libre y los recetados solamente como se lo haya indicado el mdico. Esto puede incluir medicamentos:  Para tratar la piel enrojecida o hinchada (cremas con corticoesteroides).  Para tratar la picazn.  Para tratar Obie Dredge (antihistamnicos por va oral).  Para tratar sntomas muy graves (corticoesteroides por va oral).  Cuidado de la piel  Coloque paos fros (compresas) en las zonas afectadas.  No se rasque ni se refriegue la piel.  Evite cubrir la erupcin. Asegrese de que la erupcin est expuesta al aire todo lo posible. Control de la picazn y [place] calientes. Estos pueden empeorar la picazn. Ardelia Mems ducha fra puede Runner, broadcasting/film/video.  Trate de tomar un bao con lo siguiente: ? Sales de [place]. Puede conseguirlas en la tienda de [place] indicaciones del envase. ? Bicarbonato de sodio. Vierta un poco en la baera como se lo haya indicado el mdico. ? Avena coloidal. Puede conseguirla en la tienda de comestibles o la farmacia local. [place] indicaciones del envase.  Intente colocarse una pasta de bicarbonato de [employer]. Agregue agua al bicarbonato de sodio hasta que se forme una pasta.  Intente aplicarse una locin que [employer] picazn (locin de calamina).  Mantngase fresco y al resguardo del sol. La transpiracin y el calor pueden empeorar la picazn. Indicaciones generales   Descanse todo lo que sea necesario.  Beba suficiente lquido para Contractor pis (la orina) de color amarillo plido.  Use ropa holgada.  Evite los detergentes y los jabones perfumados, y los perfumes. Utilice jabones, detergentes, perfumes y cosmticos suaves.  Evite todo lo que le cause erupcin cutnea. Lleve un diario como ayuda para registrar lo que le causa erupcin. Escriba los siguientes datos: ? Lo  que come. ? Los cosmticos que  [place]. ? Lo que bebe. ? La ropa que [place]. [place] alhajas.  Concurra a todas las visitas de seguimiento como se lo haya indicado el mdico. Esto es importante. Comunquese con un mdico si:  Transpira de noche.  Pierde peso.  Orina ms de lo normal.  Orina menos de lo normal u observa que la orina es de color ms oscuro que lo normal.  Se siente dbil.  Vomita.  Tiene un color amarillo en la piel o en las partes blancas del ojo (ictericia).  La piel: ? Hormiguea. ? Se adormece.  La erupcin cutnea: ? No desaparece despus de [employer]. ? [place]peora.  Usted: ? Est ms sediento que lo habitual. ? Est ms cansado que lo habitual.  Tiene los siguientes sntomas: ? Sntomas nuevos. ? Dolor en el vientre (abdomen). ? Fiebre. ? Materia fecal lquida (diarrea). Solicite ayuda inmediatamente si:  Jaclynn Guarneri, y los sntomas empeoran repentinamente.  Empieza a sentirse desorientado (confundido).  Siente un dolor de cabeza intenso o tiene rigidez en el cuello.  Siente mucho dolor o rigidez en las articulaciones.  Tiene una crisis de movimientos que no puede controlar (convulsiones).  La erupcin cubre todo el cuerpo o la mayor parte de [place]. La erupcin puede o no ser dolorosa.  Tiene ampollas con las siguientes caractersticas: ? Se encuentran arriba de la erupcin. ? Se agrandan. ? Crecen juntas. ? Son dolorosas. ? Estn dentro de la nariz o la boca.  Tiene una erupcin cutnea con estas caractersticas: ? Tiene pequeas manchas moradas, como si fueran pinchazos, en todo el cuerpo. ? Tiene un "ojo de buey" o se parece a un blanco de tiro. ? Est enrojecida y le duele, le produce descamacin de la piel y no se relaciona con haber estado mucho tiempo bajo el sol. Resumen  Una erupcin es un cambio en el color de la piel. Una erupcin tambin puede cambiar la forma en que se siente la piel.  El objetivo del tratamiento es calmar la picazn y evitar  que la erupcin se propague.  Tome o [employer] medicamentos de venta libre y los recetados solamente como se lo haya indicado el mdico.  Comunquese con un mdico de inmediato si tiene sntomas nuevos o si sus sntomas empeoran.  Concurra a todas las visitas de seguimiento como se lo haya indicado el mdico. Esto es importante. Esta informacin no tiene Marine scientist el consejo del mdico. Asegrese de hacerle al mdico cualquier pregunta que tenga. Document Revised: 04/30/2018 Document Reviewed: 04/30/2018 Elsevier Patient Education  2020 Elsevier Inc.      Agustina Caroli, MD [facility]

## 2020-03-16 ENCOUNTER — Ambulatory Visit (INDEPENDENT_AMBULATORY_CARE_PROVIDER_SITE_OTHER): Payer: BC Managed Care – PPO | Admitting: Emergency Medicine

## 2020-03-16 ENCOUNTER — Other Ambulatory Visit: Payer: Self-pay

## 2020-03-16 DIAGNOSIS — E039 Hypothyroidism, unspecified: Secondary | ICD-10-CM

## 2020-03-16 DIAGNOSIS — R531 Weakness: Secondary | ICD-10-CM

## 2020-03-16 DIAGNOSIS — E785 Hyperlipidemia, unspecified: Secondary | ICD-10-CM | POA: Diagnosis not present

## 2020-03-17 LAB — COMPREHENSIVE METABOLIC PANEL
ALT: 19 IU/L (ref 0–32)
AST: 16 IU/L (ref 0–40)
Albumin/Globulin Ratio: 1.5 (ref 1.2–2.2)
Albumin: 4.2 g/dL (ref 3.8–4.8)
Alkaline Phosphatase: 114 IU/L (ref 48–121)
BUN/Creatinine Ratio: 28 — ABNORMAL HIGH (ref 9–23)
BUN: 16 mg/dL (ref 6–24)
Bilirubin Total: 0.7 mg/dL (ref 0.0–1.2)
CO2: 23 mmol/L (ref 20–29)
Calcium: 8.9 mg/dL (ref 8.7–10.2)
Chloride: 104 mmol/L (ref 96–106)
Creatinine, Ser: 0.58 mg/dL (ref 0.57–1.00)
GFR calc Af Amer: 126 mL/min/{1.73_m2} (ref >59)
GFR calc non Af Amer: 109 mL/min/{1.73_m2} (ref >59)
Globulin, Total: 2.8 g/dL (ref 1.5–4.5)
Glucose: 84 mg/dL (ref 65–99)
Potassium: 4.2 mmol/L (ref 3.5–5.2)
Sodium: 138 mmol/L (ref 134–144)
Total Protein: 7 g/dL (ref 6.0–8.5)

## 2020-03-17 LAB — CBC WITH DIFFERENTIAL/PLATELET
Basophils Absolute: 0 10*3/uL (ref 0.0–0.2)
Basos: 1 %
EOS (ABSOLUTE): 0.3 10*3/uL (ref 0.0–0.4)
Eos: 4 %
Hematocrit: 42.2 % (ref 34.0–46.6)
Hemoglobin: 14 g/dL (ref 11.1–15.9)
Immature Grans (Abs): 0 10*3/uL (ref 0.0–0.1)
Immature Granulocytes: 0 %
Lymphocytes Absolute: 2.1 10*3/uL (ref 0.7–3.1)
Lymphs: 36 %
MCH: 27 pg (ref 26.6–33.0)
MCHC: 33.2 g/dL (ref 31.5–35.7)
MCV: 82 fL (ref 79–97)
Monocytes Absolute: 0.4 10*3/uL (ref 0.1–0.9)
Monocytes: 7 %
Neutrophils Absolute: 2.9 10*3/uL (ref 1.4–7.0)
Neutrophils: 52 %
Platelets: 254 10*3/uL (ref 150–450)
RBC: 5.18 x10E6/uL (ref 3.77–5.28)
RDW: 13.1 % (ref 11.7–15.4)
WBC: 5.6 10*3/uL (ref 3.4–10.8)

## 2020-03-17 LAB — LIPID PANEL
Chol/HDL Ratio: 2.5 ratio (ref 0.0–4.4)
Cholesterol, Total: 140 mg/dL (ref 100–199)
HDL: 56 mg/dL (ref >39)
LDL Chol Calc (NIH): 72 mg/dL (ref 0–99)
Triglycerides: 56 mg/dL (ref 0–149)
VLDL Cholesterol Cal: 12 mg/dL (ref 5–40)

## 2020-03-17 LAB — TSH: TSH: 0.926 u[IU]/mL (ref 0.450–4.500)

## 2020-03-20 ENCOUNTER — Telehealth: Payer: Self-pay

## 2020-03-20 NOTE — Telephone Encounter (Signed)
Patient has been informed per drs result notes and recommendations, patient verbalizes understanding.

## 2020-05-22 ENCOUNTER — Other Ambulatory Visit: Payer: Self-pay | Admitting: Emergency Medicine

## 2020-05-22 DIAGNOSIS — Z1231 Encounter for screening mammogram for malignant neoplasm of breast: Secondary | ICD-10-CM

## 2020-06-29 ENCOUNTER — Ambulatory Visit
Admission: RE | Admit: 2020-06-29 | Discharge: 2020-06-29 | Disposition: A | Discharge status: Home / Self Care | Payer: BC Managed Care – PPO | Source: Ambulatory Visit | Attending: Emergency Medicine | Admitting: Emergency Medicine

## 2020-06-29 ENCOUNTER — Other Ambulatory Visit: Payer: Self-pay

## 2020-06-29 DIAGNOSIS — Z1231 Encounter for screening mammogram for malignant neoplasm of breast: Secondary | ICD-10-CM | POA: Diagnosis not present

## 2020-07-03 ENCOUNTER — Other Ambulatory Visit: Payer: Self-pay

## 2020-07-03 DIAGNOSIS — E039 Hypothyroidism, unspecified: Secondary | ICD-10-CM

## 2020-07-03 MED ORDER — LEVOTHYROXINE SODIUM 100 MCG PO TABS: 100.0000 ug | ORAL_TABLET | Freq: Every day | ORAL | 0 refills | Status: DC

## 2020-07-17 NOTE — Progress Notes (Signed)
49 y.o. [id number] Married Hispanic female here for annual exam.    Patient complaining of nipple pain and low pelvic discomfort last month just like she use to with menstrual cycle.  Completed her Covid vaccine.  No flu vaccine.  PCP:  Agustina Caroli, MD   Patient's last menstrual period was 09/24/2019 (exact date).           Sexually active: Yes.   occ. The current method of family planning is tubal ligation/Hyst.    Exercising: Yes.    walking some Smoker:  no  Health Maintenance: Pap:  05/27/18 Neg:Neg HR HPV History of abnormal Pap:  No.  Cervix benign with hysterectomy.  MMG: 06-29-20 3D/Neg/density C/biRads1 Colonoscopy: 04-30-18 polyp remove;next 5 years BMD:   n/a  Result  n/a TDaP: 01-02-18 Gardasil:   no HIV:Neg in the past Hep C:Neg in the past Screening Labs:  PCP.    reports that she has never smoked. She has never used smokeless tobacco. She reports that she does not drink alcohol and does not use drugs.  Past Medical History:  Diagnosis Date  . Allergy   . Anemia   . Constipation    occasionally- but daily or weekly   . Elevated cholesterol   . Fibroid   . Hypothyroidism 2019  . Hypothyroidism 2019  . Thyroid disease 2005    Past Surgical History:  Procedure Laterality Date  . CESAREAN SECTION     x 1  . COLPOSCOPY    . DILATATION & CURETTAGE/HYSTEROSCOPY WITH MYOSURE N/A 07/28/2018   Procedure: DILATATION & CURETTAGE/HYSTEROSCOPY WITH MYOSURE;  Surgeon: Nunzio Cobbs, MD;  Location: [facility];  Service: Gynecology;  Laterality: N/A;  rep will be here confirmed on 07/27/18  . ROBOTIC ASSISTED TOTAL HYSTERECTOMY N/A 09/30/2019   Procedure: XI ROBOTIC ASSISTED TOTAL HYSTERECTOMY GREATER THAN 250 GRAMS;  Surgeon: Everitt Amber, MD;  Location: [facility];  Service: Gynecology;  Laterality: N/A;  . TUBAL LIGATION    . UPPER GASTROINTESTINAL ENDOSCOPY  01/16/2018  . XI ROBOTIC ASSISTED SALPINGECTOMY N/A 09/30/2019   Procedure: XI ROBOTIC ASSISTED  SALPINGECTOMY;  Surgeon: Everitt Amber, MD;  Location: [facility];  Service: Gynecology;  Laterality: N/A;    Current Outpatient Medications  Medication Sig Dispense Refill  . levothyroxine (SYNTHROID) 100 MCG tablet Take 1 tablet (100 mcg total) by mouth daily before breakfast. 90 tablet 0  . rosuvastatin (CRESTOR) 20 MG tablet Take 1 tablet (20 mg total) by mouth daily. 90 tablet 3   No current facility-administered medications for this visit.    Family History  Problem Relation Age of Onset  . Heart disease Mother   . Rectal cancer Neg Hx   . Stomach cancer Neg Hx   . Colon cancer Neg Hx   . Esophageal cancer Neg Hx   . Colon polyps Neg Hx   . Breast cancer Neg Hx     Review of Systems  All other systems reviewed and are negative.   Exam:   BP 140/82   Pulse (!) 57   Ht 5\' 5"  (1.651 m)   Wt 165 lb (74.8 kg)   LMP 09/24/2019 (Exact Date)   SpO2 99%   BMI 27.46 kg/m     General appearance: alert, cooperative and appears stated age Head: normocephalic, without obvious abnormality, atraumatic Neck: no adenopathy, supple, symmetrical, trachea midline and thyroid normal to inspection and palpation Lungs: clear to auscultation bilaterally Breasts: normal appearance, no masses or tenderness, No nipple retraction or dimpling,  No nipple discharge or bleeding, No axillary adenopathy Heart: regular rate and rhythm Abdomen: soft, non-tender; no masses, no organomegaly Extremities: extremities normal, atraumatic, no cyanosis or edema Skin: skin color, texture, turgor normal. No rashes or lesions Lymph nodes: cervical, supraclavicular, and axillary nodes normal. Neurologic: grossly normal  Pelvic: External genitalia:  no lesions              No abnormal inguinal nodes palpated.              Urethra:  normal appearing urethra with no masses, tenderness or lesions              Bartholins and Skenes: normal                 Vagina: normal appearing vagina with normal color and discharge,  no lesions              Cervix: absent              Pap taken: No. Bimanual Exam:  Uterus: absent              Adnexa: no mass, fullness, tenderness              Rectal exam: Yes.  .  Confirms.              Anus:  normal sphincter tone, no lesions  Chaperone was present for exam.  Assessment:   Well woman visit with normal exam. Status post robotically assisted hysterectomy with bilateral salpingectomy.  Ovaries remain.  Hypothyroidism.   Managed by her PCP.   Plan: Mammogram screening discussed. Self breast awareness reviewed. Pap and HR HPV as above. Guidelines for Calcium, Vitamin D, regular exercise program including cardiovascular and weight bearing exercise. Labs with PCP.   Follow up annually and prn.

## 2020-07-19 ENCOUNTER — Encounter: Payer: Self-pay | Admitting: Obstetrics and Gynecology

## 2020-07-19 ENCOUNTER — Ambulatory Visit (INDEPENDENT_AMBULATORY_CARE_PROVIDER_SITE_OTHER): Payer: BC Managed Care – PPO | Admitting: Obstetrics and Gynecology

## 2020-07-19 ENCOUNTER — Other Ambulatory Visit: Payer: Self-pay

## 2020-07-19 VITALS — BP 140/82 | HR 57 | Ht 65.0 in | Wt 165.0 lb

## 2020-07-19 DIAGNOSIS — Z01419 Encounter for gynecological examination (general) (routine) without abnormal findings: Secondary | ICD-10-CM | POA: Diagnosis not present

## 2020-07-19 NOTE — Patient Instructions (Signed)

## 2020-08-24 ENCOUNTER — Other Ambulatory Visit: Payer: Self-pay

## 2020-08-24 ENCOUNTER — Encounter: Payer: Self-pay | Admitting: Emergency Medicine

## 2020-08-24 ENCOUNTER — Ambulatory Visit (INDEPENDENT_AMBULATORY_CARE_PROVIDER_SITE_OTHER): Payer: BC Managed Care – PPO | Admitting: Emergency Medicine

## 2020-08-24 VITALS — BP 126/83 | HR 58 | Temp 97.7°F | Resp 16 | Ht 65.0 in | Wt 159.0 lb

## 2020-08-24 DIAGNOSIS — Z23 Encounter for immunization: Secondary | ICD-10-CM

## 2020-08-24 DIAGNOSIS — M1812 Unilateral primary osteoarthritis of first carpometacarpal joint, left hand: Secondary | ICD-10-CM

## 2020-08-24 DIAGNOSIS — E039 Hypothyroidism, unspecified: Secondary | ICD-10-CM | POA: Diagnosis not present

## 2020-08-24 DIAGNOSIS — E785 Hyperlipidemia, unspecified: Secondary | ICD-10-CM | POA: Diagnosis not present

## 2020-08-24 MED ORDER — MELOXICAM 15 MG PO TABS: 15.0000 mg | ORAL_TABLET | Freq: Every day | ORAL | 1 refills | Status: DC

## 2020-08-24 NOTE — Progress Notes (Signed)
Kelli Hale 49 y.o.   Chief Complaint  Patient presents with  . Hypothyroidism    Per patient follow up 6 months  . Hyperlipidemia    Follow up 6 month  . Medication Refill    Levothyroxine and Rosuvastatin    HISTORY OF PRESENT ILLNESS: This is a 49 y.o. female with history of hypothyroidism and dyslipidemia on Synthroid and rosuvastatin here for follow-up and medication refill. Also complaining of intermittent pain to left thumb area.  Saw orthopedist who said it was secondary to arthritis.  Was given local injection with good results for 3 months.  Occasionally gets some pain. Fully vaccinated against Covid. No other complaints or medical concerns today. Lab Results  Component Value Date   TSH 0.926 03/16/2020   Lab Results  Component Value Date   CHOL 140 03/16/2020   HDL 56 03/16/2020   LDLCALC 72 03/16/2020   TRIG 56 03/16/2020   CHOLHDL 2.5 03/16/2020   The 10-year ASCVD risk score Mikey Bussing DC Jr., et al., 2013) is: 0.6%   Values used to calculate the score:     Age: 11 years     Sex: Female     Is Non-Hispanic African American: No     Diabetic: No     Tobacco smoker: No     Systolic Blood Pressure: 681 mmHg     Is BP treated: No     HDL Cholesterol: 56 mg/dL     Total Cholesterol: 140 mg/dL  HPI   Prior to Admission medications   Medication Sig Start Date End Date Taking? Authorizing Provider  levothyroxine (SYNTHROID) 100 MCG tablet Take 1 tablet (100 mcg total) by mouth daily before breakfast. 07/03/20  Yes Shaterrica Territo, Ines Bloomer, MD  rosuvastatin (CRESTOR) 20 MG tablet Take 1 tablet (20 mg total) by mouth daily. 07/01/19  Yes SagardiaInes Bloomer, MD    No Known Allergies  Patient Active Problem List   Diagnosis Date Noted  . Dyslipidemia 08/18/2019  . Fibroids, intramural 08/09/2019  . Hypothyroidism 06/28/2018  . Fibroid 06/04/2018    Past Medical History:  Diagnosis Date  . Allergy   . Anemia   . Constipation    occasionally-  but daily or weekly   . Elevated cholesterol   . Fibroid   . Hypothyroidism 2019  . Hypothyroidism 2019  . Thyroid disease 2005    Past Surgical History:  Procedure Laterality Date  . CESAREAN SECTION     x 1  . COLPOSCOPY    . DILATATION & CURETTAGE/HYSTEROSCOPY WITH MYOSURE N/A 07/28/2018   Procedure: DILATATION & CURETTAGE/HYSTEROSCOPY WITH MYOSURE;  Surgeon: Nunzio Cobbs, MD;  Location: [facility];  Service: Gynecology;  Laterality: N/A;  rep will be here confirmed on 07/27/18  . ROBOTIC ASSISTED TOTAL HYSTERECTOMY N/A 09/30/2019   Procedure: XI ROBOTIC ASSISTED TOTAL HYSTERECTOMY GREATER THAN 250 GRAMS;  Surgeon: Everitt Amber, MD;  Location: [facility];  Service: Gynecology;  Laterality: N/A;  . TUBAL LIGATION    . UPPER GASTROINTESTINAL ENDOSCOPY  01/16/2018  . XI ROBOTIC ASSISTED SALPINGECTOMY N/A 09/30/2019   Procedure: XI ROBOTIC ASSISTED SALPINGECTOMY;  Surgeon: Everitt Amber, MD;  Location: [facility];  Service: Gynecology;  Laterality: N/A;    Social History   Socioeconomic History  . Marital status: Married    Spouse name: Not on file  . Number of children: 3  . Years of education: Not on file  . Highest education level: Not on file  Occupational History  .  Not on file  Tobacco Use  . Smoking status: Never Smoker  . Smokeless tobacco: Never Used  Vaping Use  . Vaping Use: Never used  Substance and Sexual Activity  . Alcohol use: No  . Drug use: No  . Sexual activity: Yes    Birth control/protection: Surgical    Comment: Hyst  Other Topics Concern  . Not on file  Social History Narrative  . Not on file   Social Determinants of Health   Financial Resource Strain: Not on file  Food Insecurity: Not on file  Transportation Needs: Not on file  Physical Activity: Not on file  Stress: Not on file  Social Connections: Not on file  Intimate Partner Violence: Not on file    Family History  Problem Relation Age of Onset  . Heart disease Mother   . Rectal  cancer Neg Hx   . Stomach cancer Neg Hx   . Colon cancer Neg Hx   . Esophageal cancer Neg Hx   . Colon polyps Neg Hx   . Breast cancer Neg Hx      Review of Systems  Constitutional: Negative.  Negative for chills and fever.  HENT: Negative.  Negative for congestion and sore throat.   Respiratory: Negative.  Negative for cough and shortness of breath.   Cardiovascular: Negative.  Negative for chest pain and palpitations.  Gastrointestinal: Negative.  Negative for abdominal pain, diarrhea, nausea and vomiting.  Genitourinary: Negative.  Negative for dysuria and hematuria.  Musculoskeletal: Positive for back pain and joint pain.  Skin: Negative.  Negative for rash.  Neurological: Negative.  Negative for dizziness and headaches.  All other systems reviewed and are negative.  Today's Vitals   08/24/20 0854  BP: 126/83  Pulse: (!) 58  Resp: 16  Temp: 97.7 F (36.5 C)  TempSrc: Temporal  SpO2: 98%  Weight: 159 lb (72.1 kg)  Height: 5\' 5"  (1.651 m)   Body mass index is 26.46 kg/m. Wt Readings from Last 3 Encounters:  08/24/20 159 lb (72.1 kg)  07/19/20 165 lb (74.8 kg)  03/02/20 161 lb 9.6 oz (73.3 kg)     Physical Exam Vitals reviewed.  Constitutional:      Appearance: Normal appearance.  HENT:     Head: Normocephalic.  Eyes:     Extraocular Movements: Extraocular movements intact.     Conjunctiva/sclera: Conjunctivae normal.     Pupils: Pupils are equal, round, and reactive to light.  Cardiovascular:     Rate and Rhythm: Normal rate and regular rhythm.     Pulses: Normal pulses.     Heart sounds: Normal heart sounds.  Pulmonary:     Effort: Pulmonary effort is normal.     Breath sounds: Normal breath sounds.  Abdominal:     General: There is no distension.     Palpations: Abdomen is soft.     Tenderness: There is no abdominal tenderness.  Musculoskeletal:        General: Normal range of motion.     Cervical back: Normal range of motion and neck supple.   Skin:    General: Skin is warm and dry.     Capillary Refill: Capillary refill takes less than 2 seconds.  Neurological:     General: No focal deficit present.     Mental Status: She is alert and oriented to person, place, and time.  Psychiatric:        Mood and Affect: Mood normal.  Behavior: Behavior normal.    A total of 30 minutes was spent with the patient, greater than 50% of which was in counseling/coordination of care regarding chronic medical problems and cardiovascular risks associated with these conditions, review of all medications, review of most recent blood work results, review of most recent office visit notes, health maintenance items, education and nutrition, instructions on how to use meloxicam only as needed, prognosis, documentation, need for follow-up.   ASSESSMENT & PLAN: Clinically stable.  No medical concerns identified during this visit. Continue present medications.  No changes. Follow-up in 6 months. Kelli Hale was seen today for hypothyroidism, hyperlipidemia and medication refill.  Diagnoses and all orders for this visit:  Dyslipidemia -     Comprehensive metabolic panel -     Lipid panel  Hypothyroidism, unspecified type -     TSH  Osteoarthritis of left thumb -     meloxicam (MOBIC) 15 MG tablet; Take 1 tablet (15 mg total) by mouth daily. Take only as needed.    Patient Instructions       If you have lab work done today you will be contacted with your lab results within the next 2 weeks.  If you have not heard from [place] then please contact us. The fastest way to get your results is to register for My Chart.   IF you received an x-ray today, you will receive an invoice from [facility] Radiology. Please contact [facility] Radiology at [phone] with questions or concerns regarding your invoice.   IF you received labwork today, you will receive an invoice from [facility]. Please contact [facility] at [phone] with questions or concerns  regarding your invoice.   Our billing staff will not be able to assist you with questions regarding bills from these companies.  You will be contacted with the lab results as soon as they are available. The fastest way to get your results is to activate your My Chart account. Instructions are located on the last page of this paperwork. If you have not heard from [place] regarding the results in 2 weeks, please contact this office.     Mantenimiento de Technical sales engineer en [place] [employer], Female Adoptar un estilo de vida saludable y recibir atencin preventiva son importantes para promover la salud y Musician. Consulte al mdico sobre:  El esquema adecuado para hacerse pruebas y exmenes peridicos.  Cosas que puede hacer por su cuenta para prevenir enfermedades y [employer]. Qu debo saber sobre la dieta, el peso y el ejercicio? Consuma una dieta saludable   Consuma una dieta que incluya muchas verduras, frutas, productos lcteos con bajo contenido de [place] y Advertising account planner.  No consuma muchos alimentos ricos en grasas slidas, azcares agregados o sodio. Mantenga un peso saludable El ndice de masa muscular [facility]) se [place] para identificar problemas de [place]. Proporciona una estimacin de la grasa corporal basndose en el peso y la altura. Su mdico puede ayudarle a Radiation protection practitioner [place] y a Scientist, forensic o Theatre manager un peso saludable. Haga ejercicio con regularidad Haga ejercicio con regularidad. Esta es una de las prcticas ms importantes que puede hacer por su salud. La mayora de los adultos deben seguir estas pautas:  Optometrist, al menos, 1100minutos de actividad fsica por semana. El ejercicio debe aumentar la frecuencia cardaca y Nature conservation officer transpirar (ejercicio de intensidad moderada).  Hacer ejercicios de fortalecimiento por lo [employer] por semana. Agregue esto a su plan de ejercicio de intensidad moderada.  Pasar menos tiempo sentados. Incluso la  actividad fsica  ligera puede ser beneficiosa. Controle sus niveles de colesterol y lpidos en la sangre Comience a realizarse anlisis de lpidos y Research officer, trade union en la sangre a los 20aos y luego reptalos cada 5aos. Hgase controlar los niveles de colesterol con mayor frecuencia si:  Sus niveles de lpidos y colesterol son altos.  Es mayor de 40aos.  Presenta un alto riesgo de padecer enfermedades cardacas. Qu debo saber sobre las pruebas de deteccin del cncer? Segn su historia clnica y sus antecedentes familiares, es posible que deba realizarse pruebas de deteccin del cncer en diferentes edades. Esto puede incluir pruebas de deteccin de lo siguiente:  Cncer de mama.  Cncer de cuello uterino.  Cncer colorrectal.  Cncer de piel.  Cncer de pulmn. Qu debo saber sobre la enfermedad cardaca, la diabetes y la hipertensin arterial? Presin arterial y enfermedad cardaca  La hipertensin arterial causa enfermedades cardacas y [place] el riesgo de accidente cerebrovascular. Es ms probable que esto se manifieste en las personas que tienen lecturas de presin arterial alta, tienen ascendencia africana o tienen sobrepeso.  Hgase controlar la presin arterial: ? Cada 3 a 5 aos si tiene entre 18 y 73 aos. ? Todos los aos si es mayor de [place]. Diabetes Realcese exmenes de deteccin de la diabetes con regularidad. Este anlisis revisa el nivel de azcar en la sangre en [place]. Hgase las pruebas de deteccin:  Cada tresaos despus de los 67aos de edad si tiene un peso normal y un bajo riesgo de padecer diabetes.  Con ms frecuencia y a partir de [place] edad inferior si tiene sobrepeso o un alto riesgo de padecer diabetes. Qu debo saber sobre la prevencin de infecciones? Hepatitis B Si tiene un riesgo ms alto de contraer hepatitis B, debe someterse a un examen de deteccin de este virus. Hable con el mdico para averiguar si tiene riesgo de contraer la infeccin por  hepatitis B. Hepatitis C Se recomienda el anlisis a:  [employer] 1945 y 1965.  Todas las personas que tengan un riesgo de haber contrado hepatitis C. Enfermedades de transmisin sexual (ETS)  Hgase las pruebas de Programme researcher, broadcasting/film/video de ITS, incluidas la gonorrea y la clamidia, si: ? Es sexualmente activa y es menor de [place]. ? Es mayor de 24aos, y Investment banker, operational informa que corre riesgo de tener este tipo de infecciones. ? La actividad sexual ha cambiado desde que le hicieron la ltima prueba de deteccin y tiene un riesgo mayor de Best boy clamidia o Radio broadcast assistant. Pregntele al mdico si usted tiene riesgo.  Pregntele al mdico si usted tiene un alto riesgo de Museum/gallery curator VIH. El mdico tambin puede recomendarle un medicamento recetado para ayudar a evitar la infeccin por el VIH. Si elige tomar medicamentos para prevenir el VIH, primero debe [employer] de deteccin del VIH. Luego debe hacerse anlisis cada 62meses mientras est tomando los medicamentos. Embarazo  Si est por dejar de Librarian, academic (fase premenopusica) y usted puede quedar [place], busque asesoramiento antes de [place].  Tome de 400 a [id number] (mcg) de cido [employer] si [place].  Pida mtodos de control de la natalidad (anticonceptivos) si desea evitar un embarazo no deseado. Osteoporosis y [place] La osteoporosis es una enfermedad en la que los huesos pierden los minerales y la fuerza por el avance de la edad. El resultado pueden ser fracturas en los [place]. Si tiene 65aos o ms, o si est en riesgo de sufrir osteoporosis y fracturas, pregunte a su mdico  si debe:  Hacerse pruebas de deteccin de prdida sea.  Tomar un suplemento de calcio o de vitamina D para reducir el riesgo de fracturas.  Recibir terapia de reemplazo hormonal (TRH) para tratar los sntomas de la menopausia. Siga estas instrucciones en su casa: Estilo de vida  No consuma ningn producto  que contenga nicotina o tabaco, como cigarrillos, cigarrillos electrnicos y tabaco de Higher education careers adviser. Si necesita ayuda para dejar de fumar, consulte al mdico.  No consuma drogas.  No comparta agujas.  Solicite ayuda a su mdico si necesita apoyo o informacin para abandonar las drogas. Consumo de alcohol  No beba alcohol si: ? Su mdico le indica no hacerlo. ? Est embarazada, puede estar embarazada o est tratando de quedar embarazada.  Si bebe alcohol: ? Limite la cantidad que consume de 0 a 1 medida por da. ? Limite la ingesta si est amamantando.  Est atento a la cantidad de alcohol que hay en las bebidas que toma. En los [place], una medida equivale a una botella de cerveza de 12oz (371ml), un vaso de vino de 5oz (128ml) o un vaso de una bebida alcohlica de alta graduacin de 1oz (52ml). Instrucciones generales  Realcese los estudios de rutina de la salud, dentales y de Public librarian.  [place].  Infrmele a su mdico si: ? Se siente deprimida con frecuencia. ? Alguna vez ha sido vctima de [place] o no se siente segura en su casa. Resumen  Adoptar un estilo de vida saludable y recibir atencin preventiva son importantes para promover la salud y Musician.  Siga las instrucciones del mdico acerca de una dieta saludable, el ejercicio y la realizacin de pruebas o exmenes para Engineer, building services.  Siga las instrucciones del mdico con respecto al control del colesterol y la presin arterial. Esta informacin no tiene Marine scientist el consejo del mdico. Asegrese de hacerle al mdico cualquier pregunta que tenga. Document Revised: 09/16/2018 Document Reviewed: 09/16/2018 Elsevier Patient Education  2020 Elsevier Inc.     Agustina Caroli, MD [facility]

## 2020-08-24 NOTE — Patient Instructions (Addendum)
If you have lab work done today you will be contacted with your lab results within the next 2 weeks.  If you have not heard from [place] then please contact us. The fastest way to get your results is to register for My Chart.   IF you received an x-ray today, you will receive an invoice from [facility] Radiology. Please contact [facility] Radiology at [phone] with questions or concerns regarding your invoice.   IF you received labwork today, you will receive an invoice from [place]. Please contact [facility] at [phone] with questions or concerns regarding your invoice.   Our billing staff will not be able to assist you with questions regarding bills from these companies.  You will be contacted with the lab results as soon as they are available. The fastest way to get your results is to activate your My Chart account. Instructions are located on the last page of this paperwork. If you have not heard from [place] regarding the results in 2 weeks, please contact this office.     Mantenimiento de Technical sales engineer en [place] [employer], Female Adoptar un estilo de vida saludable y recibir atencin preventiva son importantes para promover la salud y Musician. Consulte al mdico sobre:  El esquema adecuado para hacerse pruebas y exmenes peridicos.  Cosas que puede hacer por su cuenta para prevenir enfermedades y [employer]. Qu debo saber sobre la dieta, el peso y el ejercicio? Consuma una dieta saludable   Consuma una dieta que incluya muchas verduras, frutas, productos lcteos con bajo contenido de [place] y Advertising account planner.  No consuma muchos alimentos ricos en grasas slidas, azcares agregados o sodio. Mantenga un peso saludable El ndice de masa muscular [facility]) se [place] and the [place] para identificar problemas de [place]. Proporciona una estimacin de la grasa corporal basndose en el peso y la altura. Su mdico puede ayudarle a Radiation protection practitioner [place] y a Scientist, forensic o Theatre manager un peso  saludable. Haga ejercicio con regularidad Haga ejercicio con regularidad. Esta es una de las prcticas ms importantes que puede hacer por su salud. La mayora de los adultos deben seguir estas pautas:  Optometrist, al menos, 16minutos de actividad fsica por semana. El ejercicio debe aumentar la frecuencia cardaca y Nature conservation officer transpirar (ejercicio de intensidad moderada).  Hacer ejercicios de fortalecimiento por lo [employer] por semana. Agregue esto a su plan de ejercicio de intensidad moderada.  Pasar menos tiempo sentados. Incluso la actividad fsica ligera puede ser beneficiosa. Controle sus niveles de colesterol y lpidos en la sangre Comience a realizarse anlisis de lpidos y Research officer, trade union en la sangre a los 20aos y luego reptalos cada 5aos. Hgase controlar los niveles de colesterol con mayor frecuencia si:  Sus niveles de lpidos y colesterol son altos.  Es mayor de 40aos.  Presenta un alto riesgo de padecer enfermedades cardacas. Qu debo saber sobre las pruebas de deteccin del cncer? Segn su historia clnica y sus antecedentes familiares, es posible que deba realizarse pruebas de deteccin del cncer en diferentes edades. Esto puede incluir pruebas de deteccin de lo siguiente:  Cncer de mama.  Cncer de cuello uterino.  Cncer colorrectal.  Cncer de piel.  Cncer de pulmn. Qu debo saber sobre la enfermedad cardaca, la diabetes y la hipertensin arterial? Presin arterial y enfermedad cardaca  La hipertensin arterial causa enfermedades cardacas y [place] el riesgo de accidente cerebrovascular. Es ms probable que esto se manifieste en las personas que tienen lecturas de presin arterial alta, tienen ascendencia africana o  tienen sobrepeso.  Hgase controlar la presin arterial: ? Cada 3 a 5 aos si tiene entre 18 y 55 aos. ? Todos los aos si es mayor de [place]. Diabetes Realcese exmenes de deteccin de la diabetes con regularidad. Este  anlisis revisa el nivel de azcar en la sangre en [place]. Hgase las pruebas de deteccin:  Cada tresaos despus de los 17aos de edad si tiene un peso normal y un bajo riesgo de padecer diabetes.  Con ms frecuencia y a partir de [place] edad inferior si tiene sobrepeso o un alto riesgo de padecer diabetes. Qu debo saber sobre la prevencin de infecciones? Hepatitis B Si tiene un riesgo ms alto de contraer hepatitis B, debe someterse a un examen de deteccin de este virus. Hable con el mdico para averiguar si tiene riesgo de contraer la infeccin por hepatitis B. Hepatitis C Se recomienda el anlisis a:  [employer] 1945 y 1965.  Todas las personas que tengan un riesgo de haber contrado hepatitis C. Enfermedades de transmisin sexual (ETS)  Hgase las pruebas de Programme researcher, broadcasting/film/video de ITS, incluidas la gonorrea y la clamidia, si: ? Es sexualmente activa y es menor de [place]. ? Es mayor de 24aos, y Investment banker, operational informa que corre riesgo de tener este tipo de infecciones. ? La actividad sexual ha cambiado desde que le hicieron la ltima prueba de deteccin y tiene un riesgo mayor de Best boy clamidia o Radio broadcast assistant. Pregntele al mdico si usted tiene riesgo.  Pregntele al mdico si usted tiene un alto riesgo de Museum/gallery curator VIH. El mdico tambin puede recomendarle un medicamento recetado para ayudar a evitar la infeccin por el VIH. Si elige tomar medicamentos para prevenir el VIH, primero debe [employer] de deteccin del VIH. Luego debe hacerse anlisis cada 4meses mientras est tomando los medicamentos. Embarazo  Si est por dejar de Librarian, academic (fase premenopusica) y usted puede quedar [place], busque asesoramiento antes de [place].  Tome de 400 a [id number] (mcg) de cido [employer] si [place].  Pida mtodos de control de la natalidad (anticonceptivos) si desea evitar un embarazo no deseado. Osteoporosis y [place] La  osteoporosis es una enfermedad en la que los huesos pierden los minerales y la fuerza por el avance de la edad. El resultado pueden ser fracturas en los [place]. Si tiene 65aos o ms, o si est en riesgo de sufrir osteoporosis y fracturas, pregunte a su mdico si debe:  Hacerse pruebas de deteccin de prdida sea.  Tomar un suplemento de calcio o de vitamina D para reducir el riesgo de fracturas.  Recibir terapia de reemplazo hormonal (TRH) para tratar los sntomas de la menopausia. Siga estas instrucciones en su casa: Estilo de vida  No consuma ningn producto que contenga nicotina o tabaco, como cigarrillos, cigarrillos electrnicos y tabaco de Higher education careers adviser. Si necesita ayuda para dejar de fumar, consulte al mdico.  No consuma drogas.  No comparta agujas.  Solicite ayuda a su mdico si necesita apoyo o informacin para abandonar las drogas. Consumo de alcohol  No beba alcohol si: ? Su mdico le indica no hacerlo. ? Est embarazada, puede estar embarazada o est tratando de quedar embarazada.  Si bebe alcohol: ? Limite la cantidad que consume de 0 a 1 medida por da. ? Limite la ingesta si est amamantando.  Est atento a la cantidad de alcohol que hay en las bebidas que toma. En los [place], una medida equivale a una botella de cerveza de 12oz (318ml),  un vaso de vino de 5oz (174ml) o un vaso de una bebida alcohlica de alta graduacin de 1oz (73ml). Instrucciones generales  Realcese los estudios de rutina de la salud, dentales y de Public librarian.  [place].  Infrmele a su mdico si: ? Se siente deprimida con frecuencia. ? Alguna vez ha sido vctima de [place] o no se siente segura en su casa. Resumen  Adoptar un estilo de vida saludable y recibir atencin preventiva son importantes para promover la salud y Musician.  Siga las instrucciones del mdico acerca de una dieta saludable, el ejercicio y la realizacin de pruebas o exmenes  para Engineer, building services.  Siga las instrucciones del mdico con respecto al control del colesterol y la presin arterial. Esta informacin no tiene Marine scientist el consejo del mdico. Asegrese de hacerle al mdico cualquier pregunta que tenga. Document Revised: 09/16/2018 Document Reviewed: 09/16/2018 Elsevier Patient Education  [place].

## 2020-08-25 LAB — LIPID PANEL
Chol/HDL Ratio: 2.8 ratio (ref 0.0–4.4)
Cholesterol, Total: 182 mg/dL (ref 100–199)
HDL: 64 mg/dL (ref >39)
LDL Chol Calc (NIH): 103 mg/dL — ABNORMAL HIGH (ref 0–99)
Triglycerides: 79 mg/dL (ref 0–149)
VLDL Cholesterol Cal: 15 mg/dL (ref 5–40)

## 2020-08-25 LAB — COMPREHENSIVE METABOLIC PANEL
ALT: 17 IU/L (ref 0–32)
AST: 20 IU/L (ref 0–40)
Albumin/Globulin Ratio: 1.7 (ref 1.2–2.2)
Albumin: 4.6 g/dL (ref 3.8–4.8)
Alkaline Phosphatase: 97 IU/L (ref 44–121)
BUN/Creatinine Ratio: 18 (ref 9–23)
BUN: 13 mg/dL (ref 6–24)
Bilirubin Total: 0.8 mg/dL (ref 0.0–1.2)
CO2: 22 mmol/L (ref 20–29)
Calcium: 9.3 mg/dL (ref 8.7–10.2)
Chloride: 102 mmol/L (ref 96–106)
Creatinine, Ser: 0.72 mg/dL (ref 0.57–1.00)
GFR calc Af Amer: 114 mL/min/{1.73_m2} (ref >59)
GFR calc non Af Amer: 99 mL/min/{1.73_m2} (ref >59)
Globulin, Total: 2.7 g/dL (ref 1.5–4.5)
Glucose: 89 mg/dL (ref 65–99)
Potassium: 4.2 mmol/L (ref 3.5–5.2)
Sodium: 138 mmol/L (ref 134–144)
Total Protein: 7.3 g/dL (ref 6.0–8.5)

## 2020-08-25 LAB — TSH: TSH: 1.41 u[IU]/mL (ref 0.450–4.500)

## 2020-08-29 ENCOUNTER — Other Ambulatory Visit: Payer: Self-pay

## 2020-08-29 DIAGNOSIS — E039 Hypothyroidism, unspecified: Secondary | ICD-10-CM

## 2020-08-29 DIAGNOSIS — E785 Hyperlipidemia, unspecified: Secondary | ICD-10-CM

## 2020-08-29 MED ORDER — LEVOTHYROXINE SODIUM 100 MCG PO TABS: 100.0000 ug | ORAL_TABLET | Freq: Every day | ORAL | 2 refills | Status: DC

## 2020-08-29 MED ORDER — ROSUVASTATIN CALCIUM 20 MG PO TABS: 20.0000 mg | ORAL_TABLET | Freq: Every day | ORAL | 2 refills | Status: DC

## 2020-11-13 IMAGING — MR MR PELVIS WO/W CM
7 of 11 series · 31 of 48 positions shown · IV contrast (multihance)
Comparison: None.

CLINICAL DATA: Pelvic pain. Pelvic mass on office ultrasound.

EXAM:
MRI PELVIS WITHOUT AND WITH CONTRAST
TECHNIQUE: Multiplanar multisequence MR imaging of the pelvis was performed
both before and after administration of intravenous contrast.
CONTRAST:  15mL MULTIHANCE GADOBENATE DIMEGLUMINE 529 MG/ML IV SOLN

[Series 2: cor haste · coronal · 6.0mm · 0.78mm/px · 4 of 25 slices shown]
[im 1/25]
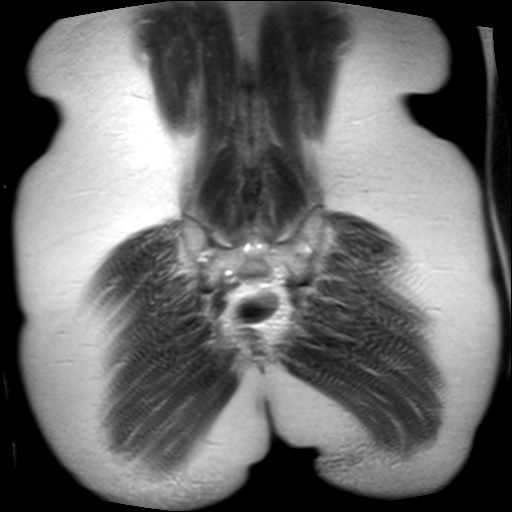
[im 9/25]
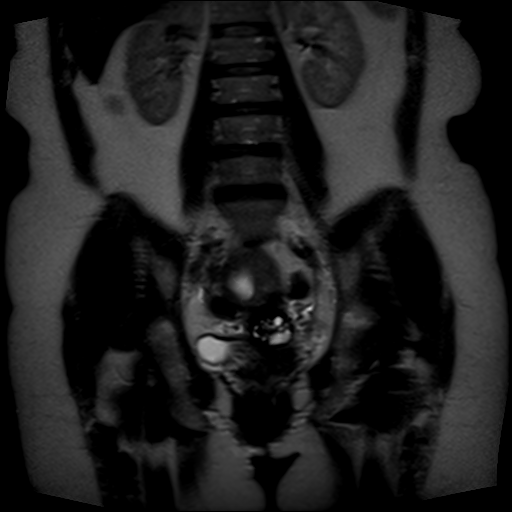
[im 17/25]
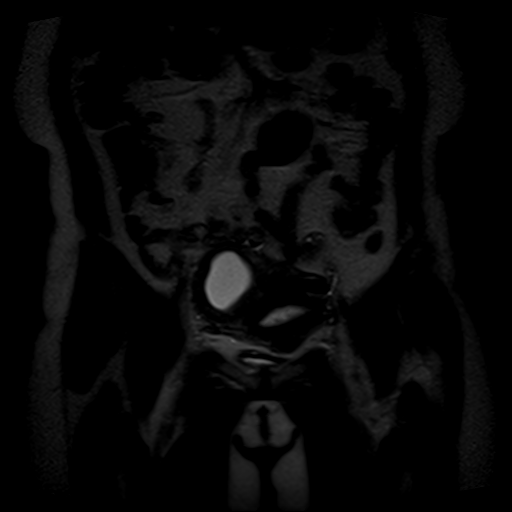
[im 25/25]
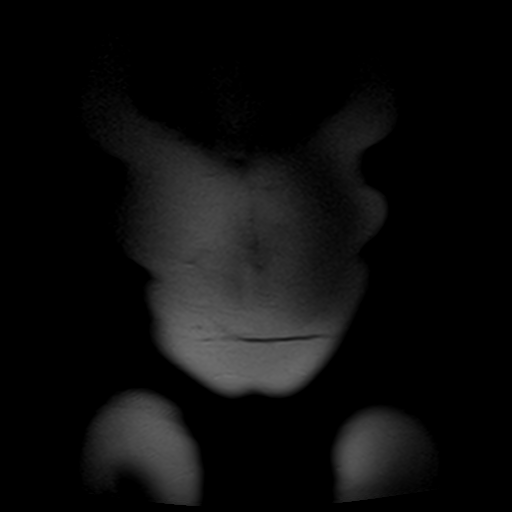

[Series 3: t2_tse_sag · sagittal · 5.0mm · 1.05mm/px · 4 of 27 slices shown]
[im 1/27]
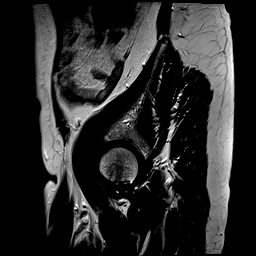
[im 9/27]
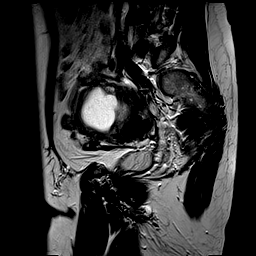
[im 18/27]
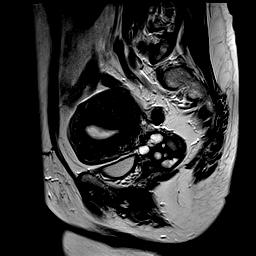
[im 27/27]
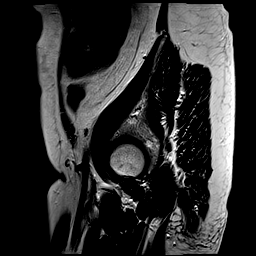

[Series 4: t2_tse axial · axial · 7.0mm · 0.98mm/px · z∈[-98,+130]mm · 4 of 26 slices shown]
[im 1/26]
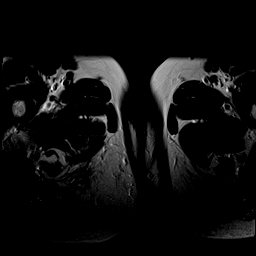
[im 9/26]
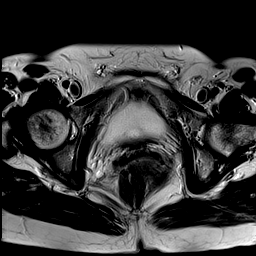
[im 17/26]
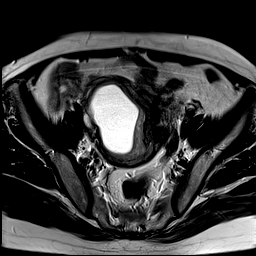
[im 26/26]
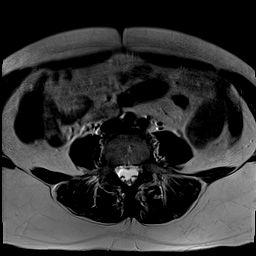

[Series 5: t2_tse axial fs · axial · 7.0mm · 0.98mm/px · z∈[-98,+130]mm · 5 of 26 slices shown]
[im 1/26]
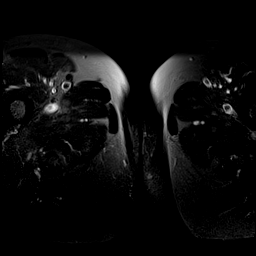
[im 7/26]
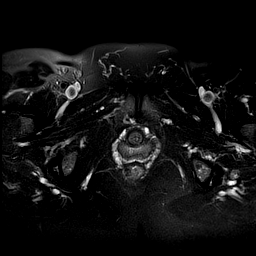
[im 13/26]
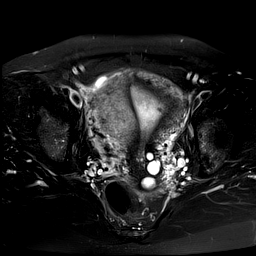
[im 19/26]
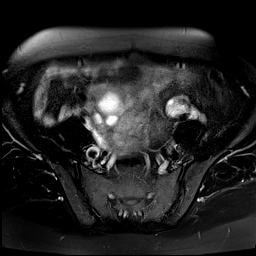
[im 26/26]
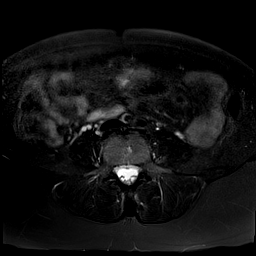

[Series 6: axial spgr · axial · 7.0mm · 0.98mm/px · z∈[-98,+130]mm · 5 of 26 slices shown]
[im 1/26]
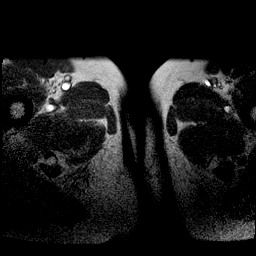
[im 7/26]
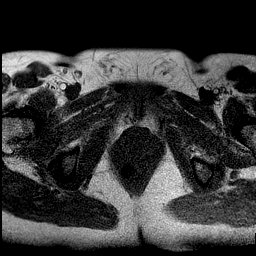
[im 13/26]
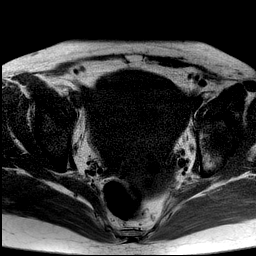
[im 19/26]
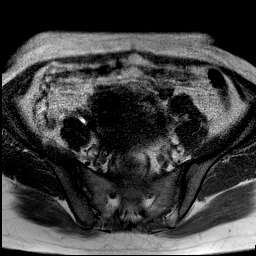
[im 26/26]
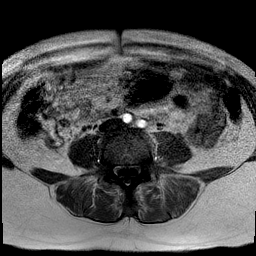

[Series 7: axial spgr pre · axial · non-contrast · 7.0mm · 0.49mm/px · z∈[-98,+130]mm · 5 of 26 slices shown]
[im 1/26]
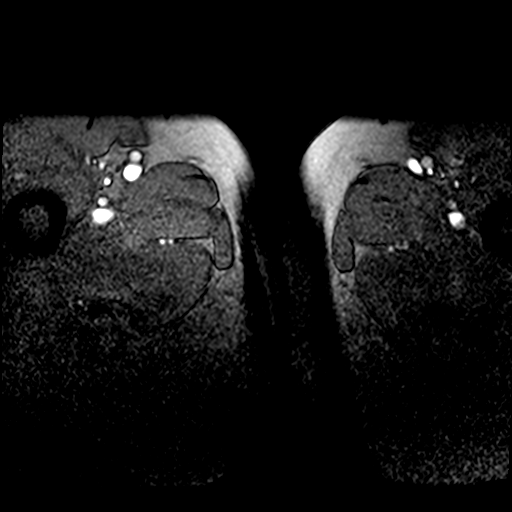
[im 7/26]
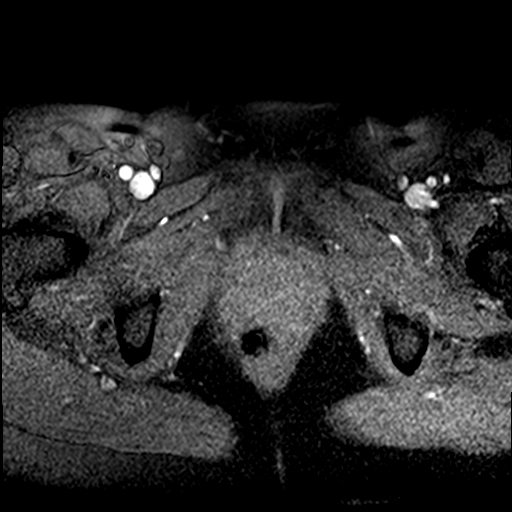
[im 13/26]
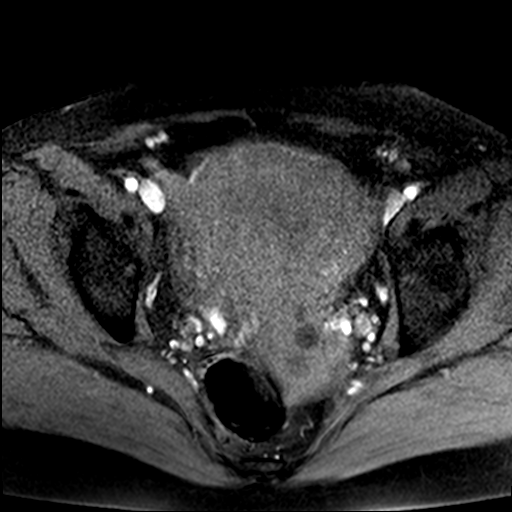
[im 19/26]
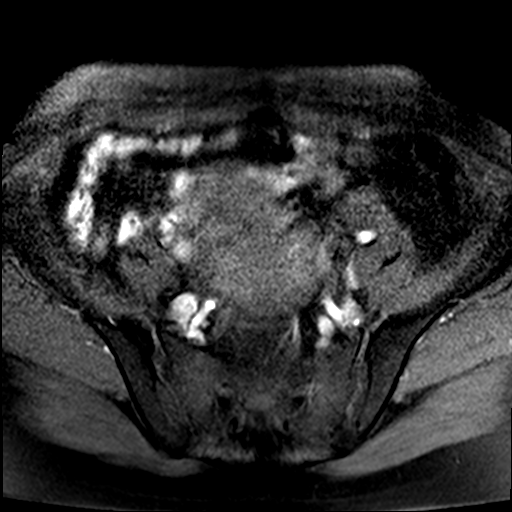
[im 26/26]
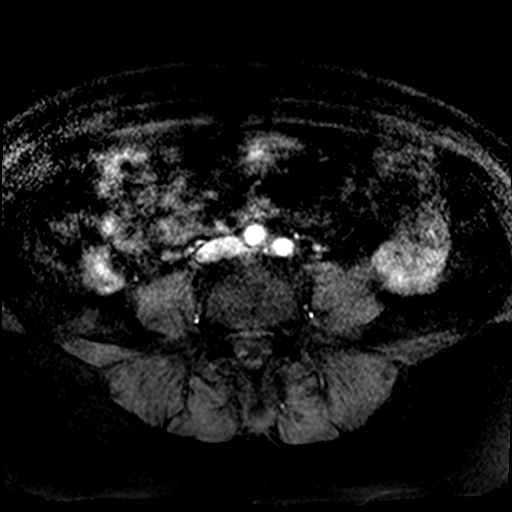

[Series 8: axial spgr post · axial · 7.0mm · 0.49mm/px · z∈[-98,+66]mm · 4 of 26 slices shown]
[im 1/26]
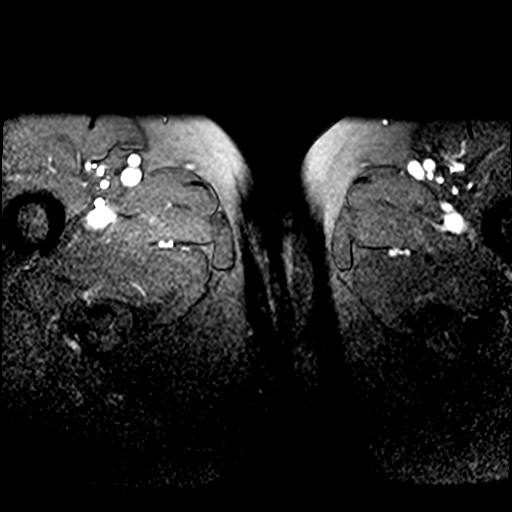
[im 7/26]
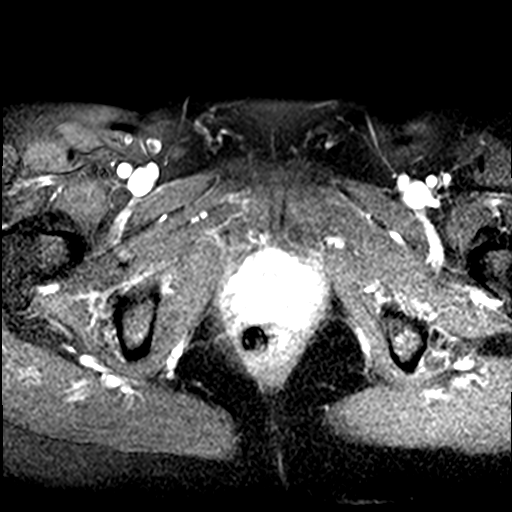
[im 13/26]
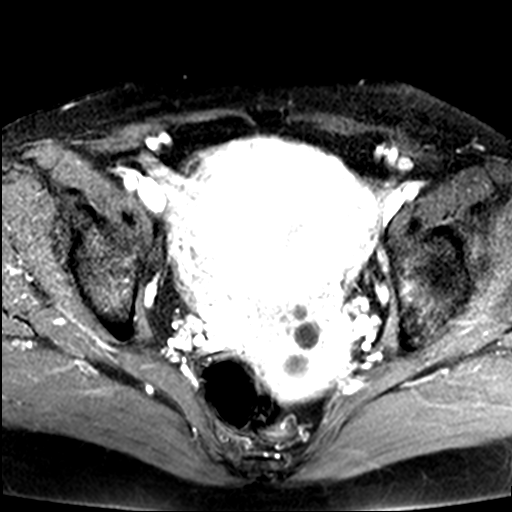
[im 19/26]
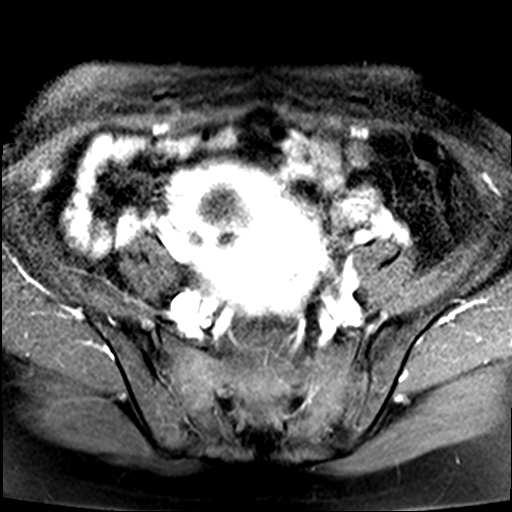

[31 of 48 positions shown; findings below may reference images not displayed]

FINDINGS: Lower Urinary Tract: No bladder or urethral abnormality identified.

Bowel:  Unremarkable visualized pelvic bowel loops.

Vascular/Lymphatic: No pathologically enlarged lymph nodes or other
significant abnormality.

Reproductive:

-- Uterus: Measures 13.4 x 8.6 x 10.6 cm (volume = 640 cm^3). A
subserosal fibroid is seen in the right posterior corpus which
measures 8.8 x 6.6 x 6.1 cm. This shows peripheral contrast
enhancement and large area of central cystic degeneration. Several
other smaller intramural and subserosal fibroids are also seen in
the upper uterine body and corpus which measure up to 2 cm in size.
No abnormal endometrial thickening. Numerous small cervical
nabothian cysts are noted.

-- Intracavitary fibroids:  None.

-- Pedunculated fibroids: None.

-- Fibroid contrast enhancement: The dominant 8.8 cm fibroid
described above shows large areas central cystic degeneration. Other
smaller fibroids measuring up to 2 cm show contrast enhancement.

-- Right ovary:  Appears normal.  No mass identified.

-- Left ovary:  Appears normal.  No mass identified.

Other: No abnormal free fluid.

Musculoskeletal:  Unremarkable.
IMPRESSION: Several subserosal and intramural fibroids, largest measuring 8.8 cm
and showing central cystic degeneration. No intracavitary or
pedunculated fibroids identified.

Normal appearance of both ovaries. No adnexal mass identified.

## 2021-02-26 ENCOUNTER — Ambulatory Visit: Payer: BC Managed Care – PPO | Admitting: Emergency Medicine

## 2021-02-27 ENCOUNTER — Ambulatory Visit: Payer: BC Managed Care – PPO | Admitting: Emergency Medicine

## 2021-03-01 ENCOUNTER — Ambulatory Visit: Payer: Self-pay | Admitting: Emergency Medicine

## 2021-03-08 ENCOUNTER — Other Ambulatory Visit: Payer: Self-pay

## 2021-03-08 ENCOUNTER — Encounter: Payer: Self-pay | Admitting: Emergency Medicine

## 2021-03-08 ENCOUNTER — Ambulatory Visit (INDEPENDENT_AMBULATORY_CARE_PROVIDER_SITE_OTHER): Payer: BC Managed Care – PPO | Admitting: Emergency Medicine

## 2021-03-08 DIAGNOSIS — E785 Hyperlipidemia, unspecified: Secondary | ICD-10-CM | POA: Diagnosis not present

## 2021-03-08 DIAGNOSIS — E039 Hypothyroidism, unspecified: Secondary | ICD-10-CM | POA: Diagnosis not present

## 2021-03-08 LAB — COMPREHENSIVE METABOLIC PANEL
ALT: 19 U/L (ref 0–35)
AST: 18 U/L (ref 0–37)
Albumin: 4.4 g/dL (ref 3.5–5.2)
Alkaline Phosphatase: 83 U/L (ref 39–117)
BUN: 17 mg/dL (ref 6–23)
CO2: 27 mEq/L (ref 19–32)
Calcium: 9 mg/dL (ref 8.4–10.5)
Chloride: 105 mEq/L (ref 96–112)
Creatinine, Ser: 0.66 mg/dL (ref 0.40–1.20)
GFR: 102.84 mL/min (ref >60.00)
Glucose, Bld: 85 mg/dL (ref 70–99)
Potassium: 3.8 mEq/L (ref 3.5–5.1)
Sodium: 139 mEq/L (ref 135–145)
Total Bilirubin: 0.8 mg/dL (ref 0.2–1.2)
Total Protein: 7.5 g/dL (ref 6.0–8.3)

## 2021-03-08 LAB — LIPID PANEL
Cholesterol: 172 mg/dL (ref 0–200)
HDL: 51 mg/dL (ref >39.00)
LDL Cholesterol: 104 mg/dL — ABNORMAL HIGH (ref 0–99)
NonHDL: 120.52
Total CHOL/HDL Ratio: 3
Triglycerides: 85 mg/dL (ref 0.0–149.0)
VLDL: 17 mg/dL (ref 0.0–40.0)

## 2021-03-08 LAB — TSH: TSH: 1.75 u[IU]/mL (ref 0.35–5.50)

## 2021-03-08 MED ORDER — LEVOTHYROXINE SODIUM 100 MCG PO TABS: 100.0000 ug | ORAL_TABLET | Freq: Every day | ORAL | 3 refills | Status: DC

## 2021-03-08 MED ORDER — ROSUVASTATIN CALCIUM 20 MG PO TABS: 20.0000 mg | ORAL_TABLET | Freq: Every day | ORAL | 3 refills | Status: DC

## 2021-03-08 NOTE — Patient Instructions (Signed)
Mantenimiento de Technical sales engineer en [place], Female Adoptar un estilo de vida saludable y recibir atencin preventiva son importantes para promover la salud y Musician. Consulte al mdico sobre: El esquema adecuado para hacerse pruebas y exmenes peridicos. Cosas que puede hacer por su cuenta para prevenir enfermedades y [employer]. Qu debo saber sobre la dieta, el peso y el ejercicio? Consuma una dieta saludable  Consuma una dieta que incluya muchas verduras, frutas, productos lcteos con bajo contenido de [place] y Advertising account planner. No consuma muchos alimentos ricos en grasas slidas, azcares agregados o sodio.  Mantenga un peso saludable El ndice de masa muscular [facility]) se [place] para identificar problemas de [place]. Proporciona una estimacin de la grasa corporal basndose en el peso y la altura. Su mdico puede ayudarle a Radiation protection practitioner [employer] y a Scientist, forensic o Theatre manager unpeso saludable. Haga ejercicio con regularidad Haga ejercicio con regularidad. Esta es una de las prcticas ms importantes que puede hacer por su salud. [employer] de los adultos deben seguir estas pautas: Optometrist, al menos, 150 minutos de actividad fsica por semana. El ejercicio debe aumentar la frecuencia cardaca y Nature conservation officer transpirar (ejercicio de intensidad moderada). Hacer ejercicios de fortalecimiento por lo [employer] por semana. Agregue esto a su plan de ejercicio de intensidad moderada. Pasar menos tiempo sentados. Incluso la actividad fsica ligera puede ser beneficiosa. Controle sus niveles de colesterol y lpidos en la sangre Comience a realizarse anlisis de lpidos y colesterol en la sangre a los20 aos y luego reptalos cada 5 aos. Hgase controlar los niveles de colesterol con mayor frecuencia si: Sus niveles de lpidos y colesterol son altos. Es mayor de 62 aos. Presenta un alto riesgo de padecer enfermedades cardacas. Qu debo saber sobre las pruebas de deteccin del  cncer? Segn su historia clnica y sus antecedentes familiares, es posible que deba realizarse pruebas de deteccin del cncer en diferentes edades. Esto puede incluir pruebas de deteccin de lo siguiente: Cncer de mama. Cncer de cuello uterino. Cncer colorrectal. Cncer de piel. Cncer de pulmn. Qu debo saber sobre la enfermedad cardaca, la diabetes y la hipertensinarterial? Presin arterial y enfermedad cardaca La hipertensin arterial causa enfermedades cardacas y [place] el riesgo de accidente cerebrovascular. Es ms probable que esto se manifieste en las personas que tienen lecturas de presin arterial alta, tienen ascendencia africana o tienen sobrepeso. Hgase controlar la presin arterial: Cada 3 a 5 aos si tiene entre 18 y 55 aos. Todos los aos si es mayor de 40 aos. Diabetes Realcese exmenes de deteccin de la diabetes con regularidad. Este anlisis revisa el nivel de azcar en la sangre en [place]. Hgase las pruebas de deteccin: Cada tres aos despus de los 26 aos de edad si tiene un peso normal y un bajo riesgo de padecer diabetes. Con ms frecuencia y a partir de [place] edad inferior si tiene sobrepeso o un alto riesgo de padecer diabetes. Qu debo saber sobre la prevencin de infecciones? Hepatitis B Si tiene un riesgo ms alto de contraer hepatitis B, debe someterse a un examen de deteccin de este virus. Hable con el mdico para averiguar si tiene riesgode contraer la infeccin por hepatitis B. Hepatitis C Se recomienda el anlisis a: [employer] 1945 y 1965. Todas las personas que tengan un riesgo de haber contrado hepatitis C. Enfermedades de transmisin sexual (ETS) Hgase las pruebas de Programme researcher, broadcasting/film/video de ITS, incluidas la gonorrea y la clamidia, si: Es sexualmente activa y es Garment/textile technologist de 24  aos. Es mayor de 36 aos, y el mdico le informa que corre riesgo de tener este tipo de infecciones. La actividad sexual ha cambiado desde que le  hicieron la ltima prueba de deteccin y tiene un riesgo mayor de Best boy clamidia o Radio broadcast assistant. Pregntele al mdico si usted tiene riesgo. Pregntele al mdico si usted tiene un alto riesgo de Museum/gallery curator VIH. El mdico tambin puede recomendarle un medicamento recetado para ayudar a evitar la infeccin por el VIH. Si elige tomar medicamentos para prevenir el VIH, primero debe [employer] de deteccin del VIH. Luego debe hacerse anlisis cada 3 meses mientras est tomando los medicamentos. Embarazo Si est por dejar de Librarian, academic (fase premenopusica) y usted puede quedar [place], busque asesoramiento antes de [place]. Tome de 400 a 800 microgramos (mcg) de cido [employer] si [place]. Pida mtodos de control de la natalidad (anticonceptivos) si desea evitar un embarazo no deseado. Osteoporosis y [place] La osteoporosis es una enfermedad en la que los huesos pierden los minerales y la fuerza por el avance de la edad. El resultado pueden ser fracturas en los [place]. Si tiene 104 aos o ms, o si est en riesgo de sufrir osteoporosis y fracturas, pregunte a su mdico si debe: Hacerse pruebas de deteccin de prdida sea. Tomar un suplemento de calcio o de vitamina D para reducir el riesgo de fracturas. Recibir terapia de reemplazo hormonal (TRH) para tratar los sntomas de la menopausia. Siga estas instrucciones en su casa: Estilo de vida No consuma ningn producto que contenga nicotina o tabaco, como cigarrillos, cigarrillos electrnicos y tabaco de Higher education careers adviser. Si necesita ayuda para dejar de fumar, consulte al mdico. No consuma drogas. No comparta agujas. Solicite ayuda a su mdico si necesita apoyo o informacin para abandonar las drogas. Consumo de alcohol No beba alcohol si: Su mdico le indica no hacerlo. Est embarazada, puede estar embarazada o est tratando de [place]. Si bebe alcohol: Limite la cantidad que consume de 0 a 1 medida por  da. Limite la ingesta si est amamantando. Est atento a la cantidad de alcohol que hay en las bebidas que toma. En los [place], una medida equivale a una botella de cerveza de 12 oz (355 ml), un vaso de vino de 5 oz (148 ml) o un vaso de una bebida alcohlica de alta graduacin de 1 oz (44 ml). Instrucciones generales Realcese los estudios de rutina de la salud, dentales y de Public librarian. [place]. Infrmele a su mdico si: Se siente deprimida con frecuencia. Alguna vez ha sido vctima de [place] o no se siente segura en su casa. Resumen Adoptar un estilo de vida saludable y recibir atencin preventiva son importantes para promover la salud y Musician. Siga las instrucciones del mdico acerca de una dieta saludable, el ejercicio y la realizacin de pruebas o exmenes para Engineer, building services. Siga las instrucciones del mdico con respecto al control del colesterol y la presin arterial. Esta informacin no tiene Marine scientist el consejo del mdico. Asegresede hacerle al mdico cualquier pregunta que tenga. Document Revised: 09/16/2018 Document Reviewed: 09/16/2018 Elsevier Patient Education  [place].

## 2021-03-08 NOTE — Assessment & Plan Note (Signed)
Diet and nutrition discussed.  Fasting lipid profile done today.  Continue rosuvastatin 20 mg daily

## 2021-03-08 NOTE — Assessment & Plan Note (Signed)
Clinically euthyroid.  Continue levothyroxine 100 mcg daily.

## 2021-03-08 NOTE — Progress Notes (Signed)
Kelli Hale 50 y.o.   Chief Complaint  Patient presents with   chronic medical condition    Follow up 6 months   Hyperlipidemia   Hypothyroidism   Medication Refill    Levothyroxine and Rousuvastatin    HISTORY OF PRESENT ILLNESS: This is a 50 y.o. female with history of hypothyroidism and dyslipidemia here for follow-up and medication refill. Presently on levothyroxine and rosuvastatin.  Has no complaints or medical concerns today.  Hyperlipidemia Pertinent negatives include no chest pain or shortness of breath.  Medication Refill Pertinent negatives include no abdominal pain, chest pain, chills, congestion, coughing, fever, headaches, nausea, rash, sore throat or vomiting.    Prior to Admission medications   Medication Sig Start Date End Date Taking? Authorizing Provider  Calcium Carbonate (CALCIUM 600 PO) Take by mouth daily.   Yes [provider]  levothyroxine (SYNTHROID) 100 MCG tablet Take 1 tablet (100 mcg total) by mouth daily before breakfast. 08/29/20  Yes Jhalen Eley, Ines Bloomer, MD  meloxicam (MOBIC) 15 MG tablet Take 1 tablet (15 mg total) by mouth daily. Take only as needed. 08/24/20  Yes Analicia Skibinski, Ines Bloomer, MD  rosuvastatin (CRESTOR) 20 MG tablet Take 1 tablet (20 mg total) by mouth daily. 08/29/20  Yes SagardiaInes Bloomer, MD  VITAMIN D PO Take by mouth daily. Patient not taking: Reported on 03/08/2021    [provider]    No Known Allergies  Patient Active Problem List   Diagnosis Date Noted   Dyslipidemia 08/18/2019   Fibroids, intramural 08/09/2019   Hypothyroidism 06/28/2018   Fibroid 06/04/2018    Past Medical History:  Diagnosis Date   Allergy    Anemia    Constipation    occasionally- but daily or weekly    Elevated cholesterol    Fibroid    Hypothyroidism 2019   Hypothyroidism 2019   Thyroid disease 2005    Past Surgical History:  Procedure Laterality Date   CESAREAN SECTION     x 1   COLPOSCOPY      DILATATION & CURETTAGE/HYSTEROSCOPY WITH MYOSURE N/A 07/28/2018   Procedure: DILATATION & CURETTAGE/HYSTEROSCOPY WITH MYOSURE;  Surgeon: Nunzio Cobbs, MD;  Location: [facility];  Service: Gynecology;  Laterality: N/A;  rep will be here confirmed on 07/27/18   ROBOTIC ASSISTED TOTAL HYSTERECTOMY N/A 09/30/2019   Procedure: XI ROBOTIC ASSISTED TOTAL HYSTERECTOMY GREATER THAN 250 GRAMS;  Surgeon: Everitt Amber, MD;  Location: [facility];  Service: Gynecology;  Laterality: N/A;   TUBAL LIGATION     UPPER GASTROINTESTINAL ENDOSCOPY  01/16/2018   XI ROBOTIC ASSISTED SALPINGECTOMY N/A 09/30/2019   Procedure: XI ROBOTIC ASSISTED SALPINGECTOMY;  Surgeon: Everitt Amber, MD;  Location: [facility];  Service: Gynecology;  Laterality: N/A;    Social History   Socioeconomic History   Marital status: Married    Spouse name: Not on file   Number of children: 3   Years of education: Not on file   Highest education level: Not on file  Occupational History   Not on file  Tobacco Use   Smoking status: Never   Smokeless tobacco: Never  Vaping Use   Vaping Use: Never used  Substance and Sexual Activity   Alcohol use: No   Drug use: No   Sexual activity: Yes    Birth control/protection: Surgical    Comment: Hyst  Other Topics Concern   Not on file  Social History Narrative   Not on file   Social Determinants of Health  Financial Resource Strain: Not on file  Food Insecurity: Not on file  Transportation Needs: Not on file  Physical Activity: Not on file  Stress: Not on file  Social Connections: Not on file  Intimate Partner Violence: Not on file    Family History  Problem Relation Age of Onset   Heart disease Mother    Rectal cancer Neg Hx    Stomach cancer Neg Hx    Colon cancer Neg Hx    Esophageal cancer Neg Hx    Colon polyps Neg Hx    Breast cancer Neg Hx      Review of Systems  Constitutional: Negative.  Negative for chills and fever.  HENT: Negative.  Negative for congestion  and sore throat.   Respiratory: Negative.  Negative for cough and shortness of breath.   Cardiovascular: Negative.  Negative for chest pain and palpitations.  Gastrointestinal: Negative.  Negative for abdominal pain, blood in stool, diarrhea, melena, nausea and vomiting.  Genitourinary: Negative.  Negative for hematuria.  Musculoskeletal: Negative.   Skin: Negative.  Negative for rash.  Neurological: Negative.  Negative for dizziness and headaches.  All other systems reviewed and are negative.  Today's Vitals   03/08/21 0835  BP: 112/84  Pulse: (!) 59  Temp: 98.1 F (36.7 C)  TempSrc: Oral  SpO2: 98%  Weight: 163 lb 12.8 oz (74.3 kg)  Height: 5\' 5"  (1.651 m)   Body mass index is 27.26 kg/m. Wt Readings from Last 3 Encounters:  03/08/21 163 lb 12.8 oz (74.3 kg)  08/24/20 159 lb (72.1 kg)  07/19/20 165 lb (74.8 kg)    Physical Exam Vitals reviewed.  Constitutional:      Appearance: Normal appearance.  HENT:     Head: Normocephalic.     Right Ear: Tympanic membrane, ear canal and external ear normal.     Left Ear: Tympanic membrane, ear canal and external ear normal.  Eyes:     Extraocular Movements: Extraocular movements intact.     Conjunctiva/sclera: Conjunctivae normal.     Pupils: Pupils are equal, round, and reactive to light.  Cardiovascular:     Rate and Rhythm: Normal rate and regular rhythm.     Pulses: Normal pulses.     Heart sounds: Normal heart sounds.  Pulmonary:     Effort: Pulmonary effort is normal.     Breath sounds: Normal breath sounds.  Abdominal:     General: Bowel sounds are normal. There is no distension.     Palpations: Abdomen is soft.     Tenderness: There is no abdominal tenderness.  Musculoskeletal:        General: Normal range of motion.     Cervical back: Normal range of motion and neck supple. No tenderness.     Right lower leg: No edema.     Left lower leg: No edema.  Lymphadenopathy:     Cervical: No cervical adenopathy.   Skin:    General: Skin is warm and dry.     Capillary Refill: Capillary refill takes less than 2 seconds.  Neurological:     General: No focal deficit present.     Mental Status: She is alert and oriented to person, place, and time.  Psychiatric:        Mood and Affect: Mood normal.        Behavior: Behavior normal.     ASSESSMENT & PLAN: A total of 30 minutes was spent with the patient and counseling/coordination of care regarding preparation for  visit, review of most recent office visit notes, review of most recent blood work results done last December, review of all medications, comprehensive history and physical exam, education on nutrition, health maintenance items including review of most recent mammogram and colonoscopy results, documentation, prognosis and need for follow-up.  Hypothyroidism Clinically euthyroid.  Continue levothyroxine 100 mcg daily.  Dyslipidemia Diet and nutrition discussed.  Fasting lipid profile done today.  Continue rosuvastatin 20 mg daily Madline was seen today for chronic medical condition, hyperlipidemia, hypothyroidism and medication refill.  Diagnoses and all orders for this visit:  Hypothyroidism, unspecified type -     levothyroxine (SYNTHROID) 100 MCG tablet; Take 1 tablet (100 mcg total) by mouth daily before breakfast. -     TSH  Dyslipidemia -     rosuvastatin (CRESTOR) 20 MG tablet; Take 1 tablet (20 mg total) by mouth daily. -     Comprehensive metabolic panel -     Lipid panel  Patient Instructions  Mantenimiento de la salud en [place] Maintenance, Female Adoptar un estilo de vida saludable y recibir atencin preventiva son importantes para promover la salud y Musician. Consulte al mdico sobre: El esquema adecuado para hacerse pruebas y exmenes peridicos. Cosas que puede hacer por su cuenta para prevenir enfermedades y [employer]. Qu debo saber sobre la dieta, el peso y el ejercicio? Consuma una dieta  saludable  Consuma una dieta que incluya muchas verduras, frutas, productos lcteos con bajo contenido de [place] y Advertising account planner. No consuma muchos alimentos ricos en grasas slidas, azcares agregados o sodio.  Mantenga un peso saludable El ndice de masa muscular [facility]) se [place] para identificar problemas de [place]. Proporciona una estimacin de la grasa corporal basndose en el peso y la altura. Su mdico puede ayudarle a Radiation protection practitioner [place] y a Scientist, forensic o Theatre manager unpeso saludable. Haga ejercicio con regularidad Haga ejercicio con regularidad. Esta es una de las prcticas ms importantes que puede hacer por su salud. La [employer] de los adultos deben seguir estas pautas: Optometrist, al menos, 150 minutos de actividad fsica por semana. El ejercicio debe aumentar la frecuencia cardaca y Nature conservation officer transpirar (ejercicio de intensidad moderada). Hacer ejercicios de fortalecimiento por lo [employer] por semana. Agregue esto a su plan de ejercicio de intensidad moderada. Pasar menos tiempo sentados. Incluso la actividad fsica ligera puede ser beneficiosa. Controle sus niveles de colesterol y lpidos en la sangre Comience a realizarse anlisis de lpidos y colesterol en la sangre a los20 aos y luego reptalos cada 5 aos. Hgase controlar los niveles de colesterol con mayor frecuencia si: Sus niveles de lpidos y colesterol son altos. Es mayor de 6 aos. Presenta un alto riesgo de padecer enfermedades cardacas. Qu debo saber sobre las pruebas de deteccin del cncer? Segn su historia clnica y sus antecedentes familiares, es posible que deba realizarse pruebas de deteccin del cncer en diferentes edades. Esto puede incluir pruebas de deteccin de lo siguiente: Cncer de mama. Cncer de cuello uterino. Cncer colorrectal. Cncer de piel. Cncer de pulmn. Qu debo saber sobre la enfermedad cardaca, la diabetes y la hipertensinarterial? Presin arterial y enfermedad cardaca La  hipertensin arterial causa enfermedades cardacas y [place] el riesgo de accidente cerebrovascular. Es ms probable que esto se manifieste en las personas que tienen lecturas de presin arterial alta, tienen ascendencia africana o tienen sobrepeso. Hgase controlar la presin arterial: Cada 3 a 5 aos si tiene entre 18 y 76 aos. Todos los aos si es mayor de 40  aos. Diabetes Realcese exmenes de deteccin de la diabetes con regularidad. Este anlisis revisa el nivel de azcar en la sangre en [place]. Hgase las pruebas de deteccin: Cada tres aos despus de los 28 aos de edad si tiene un peso normal y un bajo riesgo de padecer diabetes. Con ms frecuencia y a partir de [place] edad inferior si tiene sobrepeso o un alto riesgo de padecer diabetes. Qu debo saber sobre la prevencin de infecciones? Hepatitis B Si tiene un riesgo ms alto de contraer hepatitis B, debe someterse a un examen de deteccin de este virus. Hable con el mdico para averiguar si tiene riesgode contraer la infeccin por hepatitis B. Hepatitis C Se recomienda el anlisis a: [employer] 1945 y 1965. Todas las personas que tengan un riesgo de haber contrado hepatitis C. Enfermedades de transmisin sexual (ETS) Hgase las pruebas de Programme researcher, broadcasting/film/video de ITS, incluidas la gonorrea y la clamidia, si: Es sexualmente activa y es menor de 71 aos. Es mayor de 39 aos, y Investment banker, operational informa que corre riesgo de tener este tipo de infecciones. La actividad sexual ha cambiado desde que le hicieron la ltima prueba de deteccin y tiene un riesgo mayor de Best boy clamidia o Radio broadcast assistant. Pregntele al mdico si usted tiene riesgo. Pregntele al mdico si usted tiene un alto riesgo de Museum/gallery curator VIH. El mdico tambin puede recomendarle un medicamento recetado para ayudar a evitar la infeccin por el VIH. Si elige tomar medicamentos para prevenir el VIH, primero debe [employer] de deteccin del VIH. Luego debe hacerse  anlisis cada 3 meses mientras est tomando los medicamentos. Embarazo Si est por dejar de Librarian, academic (fase premenopusica) y usted puede quedar [place], busque asesoramiento antes de [place]. Tome de 400 a 800 microgramos (mcg) de cido [employer] si [place]. Pida mtodos de control de la natalidad (anticonceptivos) si desea evitar un embarazo no deseado. Osteoporosis y [place] La osteoporosis es una enfermedad en la que los huesos pierden los minerales y la fuerza por el avance de la edad. El resultado pueden ser fracturas en los [place]. Si tiene 22 aos o ms, o si est en riesgo de sufrir osteoporosis y fracturas, pregunte a su mdico si debe: Hacerse pruebas de deteccin de prdida sea. Tomar un suplemento de calcio o de vitamina D para reducir el riesgo de fracturas. Recibir terapia de reemplazo hormonal (TRH) para tratar los sntomas de la menopausia. Siga estas instrucciones en su casa: Estilo de vida No consuma ningn producto que contenga nicotina o tabaco, como cigarrillos, cigarrillos electrnicos y tabaco de Higher education careers adviser. Si necesita ayuda para dejar de fumar, consulte al mdico. No consuma drogas. No comparta agujas. Solicite ayuda a su mdico si necesita apoyo o informacin para abandonar las drogas. Consumo de alcohol No beba alcohol si: Su mdico le indica no hacerlo. Est embarazada, puede estar embarazada o est tratando de [place]. Si bebe alcohol: Limite la cantidad que consume de 0 a 1 medida por da. Limite la ingesta si est amamantando. Est atento a la cantidad de alcohol que hay en las bebidas que toma. En los [place], una medida equivale a una botella de cerveza de 12 oz (355 ml), un vaso de vino de 5 oz (148 ml) o un vaso de una bebida alcohlica de alta graduacin de 1 oz (44 ml). Instrucciones generales Realcese los estudios de rutina de la salud, dentales y de Public librarian. [place]. Infrmele a  su mdico si: Se siente deprimida con frecuencia. Alguna vez ha sido vctima de [place] o no se siente segura en su casa. Resumen Adoptar un estilo de vida saludable y recibir atencin preventiva son importantes para promover la salud y Musician. Siga las instrucciones del mdico acerca de una dieta saludable, el ejercicio y la realizacin de pruebas o exmenes para Engineer, building services. Siga las instrucciones del mdico con respecto al control del colesterol y la presin arterial. Esta informacin no tiene Marine scientist el consejo del mdico. Asegresede hacerle al mdico cualquier pregunta que tenga. Document Revised: 09/16/2018 Document Reviewed: 09/16/2018 Elsevier Patient Education  2022 [place], MD [facility]

## 2021-05-28 ENCOUNTER — Other Ambulatory Visit: Payer: Self-pay | Admitting: Emergency Medicine

## 2021-05-28 DIAGNOSIS — Z1231 Encounter for screening mammogram for malignant neoplasm of breast: Secondary | ICD-10-CM

## 2021-06-28 ENCOUNTER — Ambulatory Visit
Admission: RE | Admit: 2021-06-28 | Discharge: 2021-06-28 | Disposition: A | Discharge status: Home / Self Care | Payer: BC Managed Care – PPO | Source: Ambulatory Visit | Attending: Emergency Medicine | Admitting: Emergency Medicine

## 2021-06-28 ENCOUNTER — Other Ambulatory Visit: Payer: Self-pay

## 2021-06-28 DIAGNOSIS — Z1231 Encounter for screening mammogram for malignant neoplasm of breast: Secondary | ICD-10-CM

## 2021-07-05 ENCOUNTER — Other Ambulatory Visit: Payer: Self-pay

## 2021-07-05 ENCOUNTER — Ambulatory Visit
Admission: RE | Admit: 2021-07-05 | Discharge: 2021-07-05 | Disposition: A | Discharge status: Home / Self Care | Payer: BC Managed Care – PPO | Source: Ambulatory Visit | Attending: Emergency Medicine | Admitting: Emergency Medicine

## 2021-07-05 DIAGNOSIS — Z1231 Encounter for screening mammogram for malignant neoplasm of breast: Secondary | ICD-10-CM | POA: Diagnosis not present

## 2021-07-26 ENCOUNTER — Other Ambulatory Visit: Payer: Self-pay

## 2021-07-26 ENCOUNTER — Other Ambulatory Visit (HOSPITAL_COMMUNITY)
Admission: RE | Admit: 2021-07-26 | Discharge: 2021-07-26 | Disposition: A | Discharge status: Home / Self Care | Payer: BC Managed Care – PPO | Source: Ambulatory Visit | Attending: Obstetrics and Gynecology | Admitting: Obstetrics and Gynecology

## 2021-07-26 ENCOUNTER — Encounter: Payer: Self-pay | Admitting: Obstetrics and Gynecology

## 2021-07-26 ENCOUNTER — Ambulatory Visit (INDEPENDENT_AMBULATORY_CARE_PROVIDER_SITE_OTHER): Payer: BC Managed Care – PPO | Admitting: Obstetrics and Gynecology

## 2021-07-26 VITALS — BP 108/72 | HR 80 | Resp 12 | Ht 64.25 in | Wt 167.0 lb

## 2021-07-26 DIAGNOSIS — Z01419 Encounter for gynecological examination (general) (routine) without abnormal findings: Secondary | ICD-10-CM

## 2021-07-26 DIAGNOSIS — N760 Acute vaginitis: Secondary | ICD-10-CM | POA: Diagnosis not present

## 2021-07-26 NOTE — Progress Notes (Signed)
50 y.o. G1P2003 Married Hispanic female here for annual exam.    Interpretor present for the visit except the examination.   No hot flashes.   Some itching, no discharge.  Some odor.   PCP:   Dr. Joellen Jersey   Patient's last menstrual period was 09/24/2019 (exact date).           Sexually active: Yes.    The current method of family planning is tubal ligation/hysterectomy.    Exercising: Yes.     walking Smoker:  no  Health Maintenance: Pap:  05/27/18 Neg:Neg HR HPV History of abnormal Pap:  no,  Cervix benign with hysterectomy.  MMG:  07-05-21 3D/Neg/BiRads1 Colonoscopy:  04-30-18 polyp remove;next 5 years BMD:  no Result  n/a TDaP:  01-02-18 Gardasil:   no HIV:Neg in the past Hep C:Neg in the past Screening Labs:  Hb today: PCP, Urine today: not collected Flu vaccine:  Declines.     reports that she has never smoked. She has never used smokeless tobacco. She reports that she does not drink alcohol and does not use drugs.  Past Medical History:  Diagnosis Date   Allergy    Anemia    Constipation    occasionally- but daily or weekly    Elevated cholesterol    Fibroid    Hypothyroidism 2019   Hypothyroidism 2019   Thyroid disease 2005    Past Surgical History:  Procedure Laterality Date   CESAREAN SECTION     x 1   COLPOSCOPY     DILATATION & CURETTAGE/HYSTEROSCOPY WITH MYOSURE N/A 07/28/2018   Procedure: DILATATION & CURETTAGE/HYSTEROSCOPY WITH MYOSURE;  Surgeon: Nunzio Cobbs, MD;  Location: [facility];  Service: Gynecology;  Laterality: N/A;  rep will be here confirmed on 07/27/18   ROBOTIC ASSISTED TOTAL HYSTERECTOMY N/A 09/30/2019   Procedure: XI ROBOTIC ASSISTED TOTAL HYSTERECTOMY GREATER THAN 250 GRAMS;  Surgeon: Everitt Amber, MD;  Location: [facility];  Service: Gynecology;  Laterality: N/A;   TUBAL LIGATION     UPPER GASTROINTESTINAL ENDOSCOPY  01/16/2018   XI ROBOTIC ASSISTED SALPINGECTOMY N/A 09/30/2019   Procedure: XI ROBOTIC ASSISTED  SALPINGECTOMY;  Surgeon: Everitt Amber, MD;  Location: [facility];  Service: Gynecology;  Laterality: N/A;    Current Outpatient Medications  Medication Sig Dispense Refill   Calcium Carbonate (CALCIUM 600 PO) Take by mouth daily.     levothyroxine (SYNTHROID) 100 MCG tablet Take 1 tablet (100 mcg total) by mouth daily before breakfast. 90 tablet 3   meloxicam (MOBIC) 15 MG tablet Take 1 tablet (15 mg total) by mouth daily. Take only as needed. 30 tablet 1   rosuvastatin (CRESTOR) 20 MG tablet Take 1 tablet (20 mg total) by mouth daily. 90 tablet 3   VITAMIN D PO Take by mouth daily.     No current facility-administered medications for this visit.    Family History  Problem Relation Age of Onset   Heart disease Mother    Rectal cancer Neg Hx    Stomach cancer Neg Hx    Colon cancer Neg Hx    Esophageal cancer Neg Hx    Colon polyps Neg Hx    Breast cancer Neg Hx     Review of Systems  All other systems reviewed and are negative.  Exam:   BP 108/72 (BP Location: Left Arm, Patient Position: Sitting, Cuff Size: Normal)   Pulse 80   Resp 12   Ht 5' 4.25" (1.632 m)   Wt 167 lb (  75.8 kg)   LMP 09/24/2019 (Exact Date)   BMI 28.44 kg/m     General appearance: alert, cooperative and appears stated age Head: normocephalic, without obvious abnormality, atraumatic Neck: no adenopathy, supple, symmetrical, trachea midline and thyroid normal to inspection and palpation Lungs: clear to auscultation bilaterally Breasts: normal appearance, no masses or tenderness, No nipple retraction or dimpling, No nipple discharge or bleeding, No axillary adenopathy Heart: regular rate and rhythm Abdomen: soft, non-tender; no masses, no organomegaly Extremities: extremities normal, atraumatic, no cyanosis or edema Skin: skin color, texture, turgor normal. No rashes or lesions Lymph nodes: cervical, supraclavicular, and axillary nodes normal. Neurologic: grossly normal  Pelvic: External genitalia:  no  lesions              No abnormal inguinal nodes palpated.              Urethra:  normal appearing urethra with no masses, tenderness or lesions              Bartholins and Skenes: normal                 Vagina: normal appearing vagina with normal color and discharge, no lesions              Cervix: absent              Pap taken: no Bimanual Exam:  Uterus:  absent              Adnexa: no mass, fullness, tenderness              Rectal exam: Yes.  .  Confirms.              Anus:  normal sphincter tone, no lesions  Chaperone was present for exam:  Estill Bamberg, CMA  Assessment:   Well woman visit with gynecologic exam. Status post robotically assisted hysterectomy with bilateral salpingectomy.  Ovaries remain.  Hypothyroidism.   Managed by her PCP.  Vaginitis.   Plan: Mammogram screening discussed. Self breast awareness reviewed. Pap and HR HPV as above. Guidelines for Calcium, Vitamin D, regular exercise program including cardiovascular and weight bearing exercise. Nuswab sent.  Wd discussed treatments for vaginal atrophy with water based lubricants and cooking oils. Labs with PCP.  Follow up annually and prn.   After visit summary provided.

## 2021-07-26 NOTE — Patient Instructions (Signed)

## 2021-07-30 LAB — CERVICOVAGINAL ANCILLARY ONLY
Bacterial Vaginitis (gardnerella): POSITIVE — AB
Candida Glabrata: NEGATIVE
Candida Vaginitis: NEGATIVE
Comment: NEGATIVE
Comment: NEGATIVE
Comment: NEGATIVE
Comment: NEGATIVE
Trichomonas: NEGATIVE

## 2021-08-06 ENCOUNTER — Other Ambulatory Visit: Payer: Self-pay | Admitting: Anesthesiology

## 2021-08-06 MED ORDER — METRONIDAZOLE 500 MG PO TABS: 500.0000 mg | ORAL_TABLET | Freq: Two times a day (BID) | ORAL | 0 refills | Status: AC

## 2021-09-06 ENCOUNTER — Ambulatory Visit (INDEPENDENT_AMBULATORY_CARE_PROVIDER_SITE_OTHER): Payer: BC Managed Care – PPO | Admitting: Emergency Medicine

## 2021-09-06 ENCOUNTER — Other Ambulatory Visit: Payer: Self-pay

## 2021-09-06 ENCOUNTER — Encounter: Payer: Self-pay | Admitting: Emergency Medicine

## 2021-09-06 VITALS — BP 122/70 | HR 80 | Temp 98.0°F | Ht 64.0 in | Wt 166.0 lb

## 2021-09-06 DIAGNOSIS — E039 Hypothyroidism, unspecified: Secondary | ICD-10-CM | POA: Diagnosis not present

## 2021-09-06 DIAGNOSIS — E785 Hyperlipidemia, unspecified: Secondary | ICD-10-CM

## 2021-09-06 DIAGNOSIS — Z23 Encounter for immunization: Secondary | ICD-10-CM | POA: Diagnosis not present

## 2021-09-06 NOTE — Assessment & Plan Note (Signed)
Stable.  Diet and nutrition discussed. Continue rosuvastatin 20 mg daily. The 10-year ASCVD risk score (Arnett DK, et al., 2019) is: 1%   Values used to calculate the score:     Age: 50 years     Sex: Female     Is Non-Hispanic African American: No     Diabetic: No     Tobacco smoker: No     Systolic Blood Pressure: 492 mmHg     Is BP treated: No     HDL Cholesterol: 51 mg/dL     Total Cholesterol: 172 mg/dL

## 2021-09-06 NOTE — Assessment & Plan Note (Signed)
Clinically euthyroid.  Continue Synthroid 100 mcg daily. 

## 2021-09-06 NOTE — Addendum Note (Signed)
Addended by: Durwin Nora on: 09/06/2021 11:33 AM   Modules accepted: Orders

## 2021-09-06 NOTE — Progress Notes (Signed)
Kelli Hale 50 y.o.   Chief Complaint  Patient presents with   Follow-up    6 month f/ u on thyroid and cholesterol    HISTORY OF PRESENT ILLNESS: This is a 50 y.o. female here for follow-up of hypothyroidism and dyslipidemia. Presently on Synthroid 100 mcg daily and rosuvastatin 20 mg daily. Doing well.  Has no complaints or medical concerns today. Lab Results  Component Value Date   TSH 1.75 03/08/2021   Lab Results  Component Value Date   CHOL 172 03/08/2021   HDL 51.00 03/08/2021   LDLCALC 104 (H) 03/08/2021   TRIG 85.0 03/08/2021   CHOLHDL 3 03/08/2021     HPI   Prior to Admission medications   Medication Sig Start Date End Date Taking? Authorizing Provider  Calcium Carbonate (CALCIUM 600 PO) Take by mouth daily.   Yes [provider]  levothyroxine (SYNTHROID) 100 MCG tablet Take 1 tablet (100 mcg total) by mouth daily before breakfast. 03/08/21  Yes Zephaniah Lubrano, Ines Bloomer, MD  meloxicam (MOBIC) 15 MG tablet Take 1 tablet (15 mg total) by mouth daily. Take only as needed. 08/24/20  Yes Porschia Willbanks, Ines Bloomer, MD  rosuvastatin (CRESTOR) 20 MG tablet Take 1 tablet (20 mg total) by mouth daily. 03/08/21  Yes SagardiaInes Bloomer, MD  VITAMIN D PO Take by mouth daily.   Yes [provider]    No Known Allergies  Patient Active Problem List   Diagnosis Date Noted   Dyslipidemia 08/18/2019   Fibroids, intramural 08/09/2019   Hypothyroidism 06/28/2018   Fibroid 06/04/2018    Past Medical History:  Diagnosis Date   Allergy    Anemia    Constipation    occasionally- but daily or weekly    Elevated cholesterol    Fibroid    Hypothyroidism 2019   Hypothyroidism 2019   Thyroid disease 2005    Past Surgical History:  Procedure Laterality Date   CESAREAN SECTION     x 1   COLPOSCOPY     DILATATION & CURETTAGE/HYSTEROSCOPY WITH MYOSURE N/A 07/28/2018   Procedure: DILATATION & CURETTAGE/HYSTEROSCOPY WITH MYOSURE;  Surgeon: Nunzio Cobbs, MD;  Location: [facility];  Service: Gynecology;  Laterality: N/A;  rep will be here confirmed on 07/27/18   ROBOTIC ASSISTED TOTAL HYSTERECTOMY N/A 09/30/2019   Procedure: XI ROBOTIC ASSISTED TOTAL HYSTERECTOMY GREATER THAN 250 GRAMS;  Surgeon: Everitt Amber, MD;  Location: [facility];  Service: Gynecology;  Laterality: N/A;   TUBAL LIGATION     UPPER GASTROINTESTINAL ENDOSCOPY  01/16/2018   XI ROBOTIC ASSISTED SALPINGECTOMY N/A 09/30/2019   Procedure: XI ROBOTIC ASSISTED SALPINGECTOMY;  Surgeon: Everitt Amber, MD;  Location: [facility];  Service: Gynecology;  Laterality: N/A;    Social History   Socioeconomic History   Marital status: Married    Spouse name: Not on file   Number of children: 3   Years of education: Not on file   Highest education level: Not on file  Occupational History   Not on file  Tobacco Use   Smoking status: Never   Smokeless tobacco: Never  Vaping Use   Vaping Use: Never used  Substance and Sexual Activity   Alcohol use: No   Drug use: No   Sexual activity: Yes    Birth control/protection: Surgical    Comment: Hyst  Other Topics Concern   Not on file  Social History Narrative   Not on file   Social Determinants of Health   Financial  Resource Strain: Not on file  Food Insecurity: Not on file  Transportation Needs: Not on file  Physical Activity: Not on file  Stress: Not on file  Social Connections: Not on file  Intimate Partner Violence: Not on file    Family History  Problem Relation Age of Onset   Heart disease Mother    Rectal cancer Neg Hx    Stomach cancer Neg Hx    Colon cancer Neg Hx    Esophageal cancer Neg Hx    Colon polyps Neg Hx    Breast cancer Neg Hx      Review of Systems  Constitutional: Negative.  Negative for chills and fever.  HENT: Negative.  Negative for congestion and sore throat.   Eyes: Negative.   Respiratory: Negative.  Negative for cough and shortness of breath.   Cardiovascular: Negative.  Negative for  chest pain and palpitations.  Gastrointestinal: Negative.  Negative for abdominal pain, diarrhea, nausea and vomiting.  Genitourinary: Negative.  Negative for dysuria and hematuria.  Musculoskeletal: Negative.   Skin: Negative.  Negative for rash.  Neurological: Negative.  Negative for dizziness and headaches.  All other systems reviewed and are negative.  Today's Vitals   09/06/21 0840  BP: 122/70  Pulse: 80  Temp: 98 F (36.7 C)  TempSrc: Oral  SpO2: 96%  Weight: 166 lb (75.3 kg)  Height: 5\' 4"  (1.626 m)   Body mass index is 28.49 kg/m. Wt Readings from Last 3 Encounters:  09/06/21 166 lb (75.3 kg)  07/26/21 167 lb (75.8 kg)  03/08/21 163 lb 12.8 oz (74.3 kg)    Physical Exam Vitals reviewed.  Constitutional:      Appearance: Normal appearance.  HENT:     Head: Normocephalic.  Eyes:     Extraocular Movements: Extraocular movements intact.     Conjunctiva/sclera: Conjunctivae normal.     Pupils: Pupils are equal, round, and reactive to light.  Cardiovascular:     Rate and Rhythm: Normal rate and regular rhythm.     Pulses: Normal pulses.     Heart sounds: Normal heart sounds.  Pulmonary:     Effort: Pulmonary effort is normal.     Breath sounds: Normal breath sounds.  Musculoskeletal:        General: Normal range of motion.     Cervical back: Normal range of motion and neck supple. No tenderness.     Right lower leg: No edema.     Left lower leg: No edema.  Lymphadenopathy:     Cervical: No cervical adenopathy.  Skin:    General: Skin is warm and dry.     Capillary Refill: Capillary refill takes less than 2 seconds.  Neurological:     General: No focal deficit present.     Mental Status: She is alert and oriented to person, place, and time.  Psychiatric:        Mood and Affect: Mood normal.        Behavior: Behavior normal.     ASSESSMENT & PLAN: Problem List Items Addressed This Visit       Endocrine   Hypothyroidism - Primary    Clinically  euthyroid.  Continue Synthroid 100 mcg daily.        Other   Dyslipidemia    Stable.  Diet and nutrition discussed. Continue rosuvastatin 20 mg daily. The 10-year ASCVD risk score (Arnett DK, et al., 2019) is: 1%   Values used to calculate the score:     Age: 26  years     Sex: Female     Is Non-Hispanic African American: No     Diabetic: No     Tobacco smoker: No     Systolic Blood Pressure: 440 mmHg     Is BP treated: No     HDL Cholesterol: 51 mg/dL     Total Cholesterol: 172 mg/dL       Other Visit Diagnoses     Flu vaccine need          Patient Instructions  Mantenimiento de la salud en [place] Maintenance, Female Adoptar un estilo de vida saludable y recibir atencin preventiva son importantes para promover la salud y Musician. Consulte al mdico sobre: El esquema adecuado para hacerse pruebas y exmenes peridicos. Cosas que puede hacer por su cuenta para prevenir enfermedades y [place] sano. Qu debo saber sobre la dieta, el peso y el ejercicio? Consuma una dieta saludable  Consuma una dieta que incluya muchas verduras, frutas, productos lcteos con bajo contenido de [place] y Advertising account planner. No consuma muchos alimentos ricos en grasas slidas, azcares agregados o sodio. Mantenga un peso saludable El ndice de masa muscular [facility]) se [place] para identificar problemas de [place]. Proporciona una estimacin de la grasa corporal basndose en el peso y la altura. Su mdico puede ayudarle a Radiation protection practitioner [place] y a Scientist, forensic o Theatre manager un peso saludable. Haga ejercicio con regularidad Haga ejercicio con regularidad. Esta es una de las prcticas ms importantes que puede hacer por su salud. La [employer] de los adultos deben seguir estas pautas: Optometrist, al menos, 150 minutos de actividad fsica por semana. El ejercicio debe aumentar la frecuencia cardaca y Nature conservation officer transpirar (ejercicio de intensidad moderada). Hacer ejercicios de fortalecimiento por lo [employer] por semana. Agregue esto a su plan de ejercicio de intensidad moderada. Pase menos tiempo sentada. Incluso la actividad fsica ligera puede ser beneficiosa. Controle sus niveles de colesterol y lpidos en la sangre Comience a realizarse anlisis de lpidos y Research officer, trade union en la sangre a los 6 aos y luego reptalos cada 5 aos. Hgase controlar los niveles de colesterol con mayor frecuencia si: Sus niveles de lpidos y colesterol son altos. Es mayor de 64 aos. Presenta un alto riesgo de padecer enfermedades cardacas. Qu debo saber sobre las pruebas de deteccin del cncer? Segn su historia clnica y sus antecedentes familiares, es posible que deba realizarse pruebas de deteccin del cncer en diferentes edades. Esto puede incluir pruebas de deteccin de lo siguiente: Cncer de mama. Cncer de cuello uterino. Cncer colorrectal. Cncer de piel. Cncer de pulmn. Qu debo saber sobre la enfermedad cardaca, la diabetes y la hipertensin arterial? Presin arterial y enfermedad cardaca La hipertensin arterial causa enfermedades cardacas y [place] el riesgo de accidente cerebrovascular. Es ms probable que esto se manifieste en las personas que tienen lecturas de presin arterial alta o tienen sobrepeso. Hgase controlar la presin arterial: Cada 3 a 5 aos si tiene entre 18 y 24 aos. Todos los aos si es mayor de 40 aos. Diabetes Realcese exmenes de deteccin de la diabetes con regularidad. Este anlisis revisa el nivel de azcar en la sangre en [place]. Hgase las pruebas de deteccin: Cada tres aos despus de los 83 aos de edad si tiene un peso normal y un bajo riesgo de padecer diabetes. Con ms frecuencia y a partir de [place] edad inferior si tiene sobrepeso o un alto riesgo de padecer diabetes. Qu debo saber sobre la prevencin de infecciones? Hepatitis B  Si tiene un riesgo ms alto de Museum/gallery curator hepatitis B, debe someterse a un examen de deteccin de este virus. Hable  con el mdico para averiguar si tiene riesgo de contraer la infeccin por hepatitis B. Hepatitis C Se recomienda el anlisis a: [employer] 1945 y 1965. Todas las personas que tengan un riesgo de haber contrado hepatitis C. Enfermedades de transmisin sexual (ETS) Hgase las pruebas de Programme researcher, broadcasting/film/video de ITS, incluidas la gonorrea y la clamidia, si: Es sexualmente activa y es menor de 22 aos. Es mayor de 34 aos, y Investment banker, operational informa que corre riesgo de tener este tipo de infecciones. La actividad sexual ha cambiado desde que le hicieron la ltima prueba de deteccin y tiene un riesgo mayor de Best boy clamidia o Radio broadcast assistant. Pregntele al mdico si usted tiene riesgo. Pregntele al mdico si usted tiene un alto riesgo de Museum/gallery curator VIH. El mdico tambin puede recomendarle un medicamento recetado para ayudar a evitar la infeccin por el VIH. Si elige tomar medicamentos para prevenir el VIH, primero debe [place] de deteccin del VIH. Luego debe hacerse anlisis cada 3 meses mientras est tomando los medicamentos. Embarazo Si est por dejar de Librarian, academic (fase premenopusica) y usted puede quedar [place], busque asesoramiento antes de [place]. Tome de 400 a 800 microgramos (mcg) de cido [employer] si [place]. Pida mtodos de control de la natalidad (anticonceptivos) si desea evitar un embarazo no deseado. Osteoporosis y [place] La osteoporosis es una enfermedad en la que los huesos pierden los minerales y la fuerza por el avance de la edad. El resultado pueden ser fracturas en los [place]. Si tiene 90 aos o ms, o si est en riesgo de sufrir osteoporosis y fracturas, pregunte a su mdico si debe: Hacerse pruebas de deteccin de prdida sea. Tomar un suplemento de calcio o de vitamina D para reducir el riesgo de fracturas. Recibir terapia de reemplazo hormonal (TRH) para tratar los sntomas de la menopausia. Siga estas indicaciones en su  casa: Consumo de alcohol No beba alcohol si: Su mdico le indica no hacerlo. Est embarazada, puede estar embarazada o est tratando de [place]. Si bebe alcohol: Limite la cantidad que bebe a lo siguiente: De 0 a 1 bebida por da. Sepa cunta cantidad de alcohol hay en las bebidas que toma. En los [place], una medida equivale a una botella de cerveza de 12 oz (355 ml), un vaso de vino de 5 oz (148 ml) o un vaso de una bebida alcohlica de alta graduacin de 1 oz (44 ml). Estilo de vida No consuma ningn producto que contenga nicotina o tabaco. Estos productos incluyen cigarrillos, tabaco para Higher education careers adviser y aparatos de vapeo, como los Psychologist, sport and exercise. Si necesita ayuda para dejar de consumir estos productos, consulte al mdico. No consuma drogas. No comparta agujas. Solicite ayuda a su mdico si necesita apoyo o informacin para abandonar las drogas. Indicaciones generales Realcese los estudios de rutina de la salud, dentales y de Public librarian. [place]. Infrmele a su mdico si: Se siente deprimida con frecuencia. Alguna vez ha sido vctima de [place] o no se siente seguro en su casa. Resumen Adoptar un estilo de vida saludable y recibir atencin preventiva son importantes para promover la salud y Musician. Siga las instrucciones del mdico acerca de una dieta saludable, el ejercicio y la realizacin de pruebas o exmenes para Engineer, building services. Siga las instrucciones del mdico con respecto al control del colesterol  y la presin arterial. Esta informacin no tiene Marine scientist el consejo del mdico. Asegrese de hacerle al mdico cualquier pregunta que tenga. Document Revised: 02/01/2021 Document Reviewed: 02/01/2021 Elsevier Patient Education  2022 [place], MD [facility]

## 2021-09-06 NOTE — Patient Instructions (Signed)

## 2021-11-15 ENCOUNTER — Telehealth: Payer: Self-pay

## 2021-11-15 NOTE — Telephone Encounter (Signed)
Pt is calling requesting a refill on: ?levothyroxine (SYNTHROID) 100 MCG tablet ? ?Pharmacy: ?[place], [place]. ? ?LOV 09/06/22 ?

## 2021-11-16 ENCOUNTER — Other Ambulatory Visit: Payer: Self-pay

## 2021-11-16 DIAGNOSIS — E039 Hypothyroidism, unspecified: Secondary | ICD-10-CM

## 2021-11-16 MED ORDER — LEVOTHYROXINE SODIUM 100 MCG PO TABS: 100.0000 ug | ORAL_TABLET | Freq: Every day | ORAL | 3 refills | Status: DC

## 2021-11-16 NOTE — Progress Notes (Signed)
Refilled medication

## 2022-03-18 ENCOUNTER — Ambulatory Visit (INDEPENDENT_AMBULATORY_CARE_PROVIDER_SITE_OTHER): Payer: BC Managed Care – PPO | Admitting: Emergency Medicine

## 2022-03-18 ENCOUNTER — Encounter: Payer: Self-pay | Admitting: Emergency Medicine

## 2022-03-18 VITALS — BP 120/84 | HR 60 | Temp 98.3°F | Ht 64.0 in | Wt 174.0 lb

## 2022-03-18 DIAGNOSIS — M549 Dorsalgia, unspecified: Secondary | ICD-10-CM | POA: Insufficient documentation

## 2022-03-18 DIAGNOSIS — E785 Hyperlipidemia, unspecified: Secondary | ICD-10-CM | POA: Diagnosis not present

## 2022-03-18 DIAGNOSIS — D219 Benign neoplasm of connective and other soft tissue, unspecified: Secondary | ICD-10-CM

## 2022-03-18 DIAGNOSIS — Z23 Encounter for immunization: Secondary | ICD-10-CM | POA: Diagnosis not present

## 2022-03-18 DIAGNOSIS — E039 Hypothyroidism, unspecified: Secondary | ICD-10-CM | POA: Diagnosis not present

## 2022-03-18 LAB — COMPREHENSIVE METABOLIC PANEL WITH GFR
ALT: 22 U/L (ref 0–35)
AST: 19 U/L (ref 0–37)
Albumin: 4.4 g/dL (ref 3.5–5.2)
Alkaline Phosphatase: 86 U/L (ref 39–117)
BUN: 15 mg/dL (ref 6–23)
CO2: 24 meq/L (ref 19–32)
Calcium: 9 mg/dL (ref 8.4–10.5)
Chloride: 104 meq/L (ref 96–112)
Creatinine, Ser: 0.63 mg/dL (ref 0.40–1.20)
GFR: 103.25 mL/min
Glucose, Bld: 91 mg/dL (ref 70–99)
Potassium: 3.9 meq/L (ref 3.5–5.1)
Sodium: 137 meq/L (ref 135–145)
Total Bilirubin: 0.7 mg/dL (ref 0.2–1.2)
Total Protein: 7.5 g/dL (ref 6.0–8.3)

## 2022-03-18 LAB — LIPID PANEL
Cholesterol: 264 mg/dL — ABNORMAL HIGH (ref 0–200)
HDL: 54.8 mg/dL
LDL Cholesterol: 183 mg/dL — ABNORMAL HIGH (ref 0–99)
NonHDL: 209.33
Total CHOL/HDL Ratio: 5
Triglycerides: 131 mg/dL (ref 0.0–149.0)
VLDL: 26.2 mg/dL (ref 0.0–40.0)

## 2022-03-18 LAB — TSH: TSH: 3.72 u[IU]/mL (ref 0.35–5.50)

## 2022-03-18 NOTE — Assessment & Plan Note (Signed)
Mechanical and related to daily activities. Advised to take Tylenol as needed.

## 2022-03-18 NOTE — Progress Notes (Signed)
Lab Results  Component Value Date   TSH 1.75 03/08/2021   Lab Results  Component Value Date   CHOL 172 03/08/2021   HDL 51.00 03/08/2021   LDLCALC 104 (H) 03/08/2021   TRIG 85.0 03/08/2021   CHOLHDL 3 03/08/2021   Kelli Hale 51 y.o.   Chief Complaint  Patient presents with  . Follow-up     Month f/u appt no concerns     HISTORY OF PRESENT ILLNESS: This is a 51 y.o. female with history of hypothyroidism and dyslipidemia here for follow-up. Compliant with medications rosuvastatin and levothyroxine. Due for shingles vaccine. Chronic upper back pain related to daily physical activities. No other complaints or medical concerns today.  HPI   Prior to Admission medications   Medication Sig Start Date End Date Taking? Authorizing Provider  Calcium Carbonate (CALCIUM 600 PO) Take by mouth daily.    [provider]  levothyroxine (SYNTHROID) 100 MCG tablet Take 1 tablet (100 mcg total) by mouth daily before breakfast. 11/16/21   Dorina Ribaudo, Ines Bloomer, MD  meloxicam (MOBIC) 15 MG tablet Take 1 tablet (15 mg total) by mouth daily. Take only as needed. 08/24/20   Horald Pollen, MD  rosuvastatin (CRESTOR) 20 MG tablet Take 1 tablet (20 mg total) by mouth daily. 03/08/21   Horald Pollen, MD  VITAMIN D PO Take by mouth daily.    [provider]    No Known Allergies  Patient Active Problem List   Diagnosis Date Noted  . Dyslipidemia 08/18/2019  . Fibroids, intramural 08/09/2019  . Fibroid 06/04/2018    Past Medical History:  Diagnosis Date  . Allergy   . Anemia   . Constipation    occasionally- but daily or weekly   . Elevated cholesterol   . Fibroid   . Hypothyroidism 2019  . Hypothyroidism 2019  . Thyroid disease 2005    Past Surgical History:  Procedure Laterality Date  . CESAREAN SECTION     x 1  . COLPOSCOPY    . DILATATION & CURETTAGE/HYSTEROSCOPY WITH MYOSURE N/A 07/28/2018   Procedure: DILATATION &  CURETTAGE/HYSTEROSCOPY WITH MYOSURE;  Surgeon: Nunzio Cobbs, MD;  Location: [facility];  Service: Gynecology;  Laterality: N/A;  rep will be here confirmed on 07/27/18  . ROBOTIC ASSISTED TOTAL HYSTERECTOMY N/A 09/30/2019   Procedure: XI ROBOTIC ASSISTED TOTAL HYSTERECTOMY GREATER THAN 250 GRAMS;  Surgeon: Everitt Amber, MD;  Location: [facility];  Service: Gynecology;  Laterality: N/A;  . TUBAL LIGATION    . UPPER GASTROINTESTINAL ENDOSCOPY  01/16/2018  . XI ROBOTIC ASSISTED SALPINGECTOMY N/A 09/30/2019   Procedure: XI ROBOTIC ASSISTED SALPINGECTOMY;  Surgeon: Everitt Amber, MD;  Location: [facility];  Service: Gynecology;  Laterality: N/A;    Social History   Socioeconomic History  . Marital status: Married    Spouse name: Not on file  . Number of children: 3  . Years of education: Not on file  . Highest education level: Not on file  Occupational History  . Not on file  Tobacco Use  . Smoking status: Never  . Smokeless tobacco: Never  Vaping Use  . Vaping Use: Never used  Substance and Sexual Activity  . Alcohol use: No  . Drug use: No  . Sexual activity: Yes    Birth control/protection: Surgical    Comment: Hyst  Other Topics Concern  . Not on file  Social History Narrative  . Not on file   Social Determinants of Health  Financial Resource Strain: Not on file  Food Insecurity: Not on file  Transportation Needs: Not on file  Physical Activity: Not on file  Stress: Not on file  Social Connections: Not on file  Intimate Partner Violence: Not on file    Family History  Problem Relation Age of Onset  . Heart disease Mother   . Rectal cancer Neg Hx   . Stomach cancer Neg Hx   . Colon cancer Neg Hx   . Esophageal cancer Neg Hx   . Colon polyps Neg Hx   . Breast cancer Neg Hx      Review of Systems  Constitutional: Negative.  Negative for chills and fever.  HENT: Negative.  Negative for congestion and sore throat.   Eyes: Negative.   Respiratory: Negative.   Negative for cough and shortness of breath.   Cardiovascular: Negative.  Negative for chest pain and palpitations.  Gastrointestinal:  Negative for abdominal pain, blood in stool, diarrhea, nausea and vomiting.  Genitourinary: Negative.  Negative for dysuria and hematuria.  Musculoskeletal:  Positive for back pain.  Skin: Negative.  Negative for rash.  Neurological:  Negative for dizziness and headaches.  All other systems reviewed and are negative.  Today's Vitals   03/18/22 0806  BP: 120/84  Pulse: 60  Temp: 98.3 F (36.8 C)  TempSrc: Oral  SpO2: 97%  Weight: 174 lb (78.9 kg)  Height: '5\' 4"'$  (1.626 m)   Body mass index is 29.87 kg/m. Wt Readings from Last 3 Encounters:  03/18/22 174 lb (78.9 kg)  09/06/21 166 lb (75.3 kg)  07/26/21 167 lb (75.8 kg)     Physical Exam Vitals reviewed.  Constitutional:      Appearance: Normal appearance.  HENT:     Head: Normocephalic.     Mouth/Throat:     Mouth: Mucous membranes are moist.     Pharynx: Oropharynx is clear.  Eyes:     Extraocular Movements: Extraocular movements intact.     Conjunctiva/sclera: Conjunctivae normal.     Pupils: Pupils are equal, round, and reactive to light.  Cardiovascular:     Rate and Rhythm: Normal rate and regular rhythm.     Pulses: Normal pulses.     Heart sounds: Normal heart sounds.  Pulmonary:     Effort: Pulmonary effort is normal.     Breath sounds: Normal breath sounds.  Musculoskeletal:        General: Normal range of motion.     Cervical back: No tenderness.     Right lower leg: No edema.     Left lower leg: No edema.  Lymphadenopathy:     Cervical: No cervical adenopathy.  Skin:    General: Skin is warm and dry.  Neurological:     General: No focal deficit present.     Mental Status: She is alert and oriented to person, place, and time.     ASSESSMENT & PLAN: Problem List Items Addressed This Visit       Endocrine   RESOLVED: Hypothyroidism - Primary    Clinically  euthyroid.  TSH done today. Continue levothyroxine 100 mcg daily.      Relevant Orders   TSH     Other   Fibroid   Dyslipidemia    Stable.  Diet and nutrition discussed.  Continue rosuvastatin 20 mg daily. The 10-year ASCVD risk score (Arnett DK, et al., 2019) is: 1%   Values used to calculate the score:     Age: 28 years  Sex: Female     Is Non-Hispanic African American: No     Diabetic: No     Tobacco smoker: No     Systolic Blood Pressure: 063 mmHg     Is BP treated: No     HDL Cholesterol: 51 mg/dL     Total Cholesterol: 172 mg/dL       Relevant Orders   Lipid panel   Comprehensive metabolic panel   Musculoskeletal back pain    Mechanical and related to daily activities. Advised to take Tylenol as needed.      Other Visit Diagnoses     Need for vaccination       Relevant Orders   Varicella-zoster vaccine subcutaneous      Patient Instructions  Mantenimiento de la salud en [place] Maintenance, Female Adoptar un estilo de vida saludable y recibir atencin preventiva son importantes para promover la salud y Musician. Consulte al mdico sobre: El esquema adecuado para hacerse pruebas y exmenes peridicos. Cosas que puede hacer por su cuenta para prevenir enfermedades y [place] sano. Qu debo saber sobre la dieta, el peso y el ejercicio? Consuma una dieta saludable  Consuma una dieta que incluya muchas verduras, frutas, productos lcteos con bajo contenido de [place] y Advertising account planner. No consuma muchos alimentos ricos en grasas slidas, azcares agregados o sodio. Mantenga un peso saludable El ndice de masa muscular [facility]) se [place] para identificar problemas de [place]. Proporciona una estimacin de la grasa corporal basndose en el peso y la altura. Su mdico puede ayudarle a Radiation protection practitioner [place] y a Scientist, forensic o Theatre manager un peso saludable. Haga ejercicio con regularidad Haga ejercicio con regularidad. Esta es una de las prcticas ms importantes  que puede hacer por su salud. La [employer] de los adultos deben seguir estas pautas: Optometrist, al menos, 150 minutos de actividad fsica por semana. El ejercicio debe aumentar la frecuencia cardaca y Nature conservation officer transpirar (ejercicio de intensidad moderada). Hacer ejercicios de fortalecimiento por lo [employer] por semana. Agregue esto a su plan de ejercicio de intensidad moderada. Pase menos tiempo sentada. Incluso la actividad fsica ligera puede ser beneficiosa. Controle sus niveles de colesterol y lpidos en la sangre Comience a realizarse anlisis de lpidos y Research officer, trade union en la sangre a los 64 aos y luego reptalos cada 5 aos. Hgase controlar los niveles de colesterol con mayor frecuencia si: Sus niveles de lpidos y colesterol son altos. Es mayor de 73 aos. Presenta un alto riesgo de padecer enfermedades cardacas. Qu debo saber sobre las pruebas de deteccin del cncer? Segn su historia clnica y sus antecedentes familiares, es posible que deba realizarse pruebas de deteccin del cncer en diferentes edades. Esto puede incluir pruebas de deteccin de lo siguiente: Cncer de mama. Cncer de cuello uterino. Cncer colorrectal. Cncer de piel. Cncer de pulmn. Qu debo saber sobre la enfermedad cardaca, la diabetes y la hipertensin arterial? Presin arterial y enfermedad cardaca La hipertensin arterial causa enfermedades cardacas y [place] el riesgo de accidente cerebrovascular. Es ms probable que esto se manifieste en las personas que tienen lecturas de presin arterial alta o tienen sobrepeso. Hgase controlar la presin arterial: Cada 3 a 5 aos si tiene entre 18 y 23 aos. Todos los aos si es mayor de 40 aos. Diabetes Realcese exmenes de deteccin de la diabetes con regularidad. Este anlisis revisa el nivel de azcar en la sangre en [place]. Hgase las pruebas de deteccin: Cada tres aos despus de los 18 aos de edad si tiene  un peso normal y un bajo riesgo de  padecer diabetes. Con ms frecuencia y a partir de [place] edad inferior si tiene sobrepeso o un alto riesgo de padecer diabetes. Qu debo saber sobre la prevencin de infecciones? Hepatitis B Si tiene un riesgo ms alto de contraer hepatitis B, debe someterse a un examen de deteccin de este virus. Hable con el mdico para averiguar si tiene riesgo de contraer la infeccin por hepatitis B. Hepatitis C Se recomienda el anlisis a: [employer] 1945 y 1965. Todas las personas que tengan un riesgo de haber contrado hepatitis C. Enfermedades de transmisin sexual (ETS) Hgase las pruebas de Programme researcher, broadcasting/film/video de ITS, incluidas la gonorrea y la clamidia, si: Es sexualmente activa y es menor de 51 aos. Es mayor de 68 aos, y Investment banker, operational informa que corre riesgo de tener este tipo de infecciones. La actividad sexual ha cambiado desde que le hicieron la ltima prueba de deteccin y tiene un riesgo mayor de Best boy clamidia o Radio broadcast assistant. Pregntele al mdico si usted tiene riesgo. Pregntele al mdico si usted tiene un alto riesgo de Museum/gallery curator VIH. El mdico tambin puede recomendarle un medicamento recetado para ayudar a evitar la infeccin por el VIH. Si elige tomar medicamentos para prevenir el VIH, primero debe [employer] de deteccin del VIH. Luego debe hacerse anlisis cada 3 meses mientras est tomando los medicamentos. Embarazo Si est por dejar de Librarian, academic (fase premenopusica) y usted puede quedar [place], busque asesoramiento antes de [place]. Tome de 400 a 800 microgramos (mcg) de cido [employer] si [place]. Pida mtodos de control de la natalidad (anticonceptivos) si desea evitar un embarazo no deseado. Osteoporosis y [place] La osteoporosis es una enfermedad en la que los huesos pierden los minerales y la fuerza por el avance de la edad. El resultado pueden ser fracturas en los [place]. Si tiene 51 aos o ms, o si est en riesgo de sufrir  osteoporosis y fracturas, pregunte a su mdico si debe: Hacerse pruebas de deteccin de prdida sea. Tomar un suplemento de calcio o de vitamina D para reducir el riesgo de fracturas. Recibir terapia de reemplazo hormonal (TRH) para tratar los sntomas de la menopausia. Siga estas indicaciones en su casa: Consumo de alcohol No beba alcohol si: Su mdico le indica no hacerlo. Est embarazada, puede estar embarazada o est tratando de [place]. Si bebe alcohol: Limite la cantidad que bebe a lo siguiente: De 0 a 1 bebida por da. Sepa cunta cantidad de alcohol hay en las bebidas que toma. En los [place], una medida equivale a una botella de cerveza de 12 oz (355 ml), un vaso de vino de 5 oz (148 ml) o un vaso de una bebida alcohlica de alta graduacin de 1 oz (44 ml). Estilo de vida No consuma ningn producto que contenga nicotina o tabaco. Estos productos incluyen cigarrillos, tabaco para Higher education careers adviser y aparatos de vapeo, como los Psychologist, sport and exercise. Si necesita ayuda para dejar de consumir estos productos, consulte al mdico. No consuma drogas. No comparta agujas. Solicite ayuda a su mdico si necesita apoyo o informacin para abandonar las drogas. Indicaciones generales Realcese los estudios de rutina de la salud, dentales y de Public librarian. [place]. Infrmele a su mdico si: Se siente deprimida con frecuencia. Alguna vez ha sido vctima de [place] o no se siente seguro en su casa. Resumen Adoptar un estilo de vida saludable y recibir atencin preventiva son importantes  para promover la salud y Musician. Siga las instrucciones del mdico acerca de una dieta saludable, el ejercicio y la realizacin de pruebas o exmenes para Engineer, building services. Siga las instrucciones del mdico con respecto al control del colesterol y la presin arterial. Esta informacin no tiene Marine scientist el consejo del mdico. Asegrese de hacerle al mdico  cualquier pregunta que tenga. Document Revised: 02/01/2021 Document Reviewed: 02/01/2021 Elsevier Patient Education  [place], MD [facility]

## 2022-03-18 NOTE — Assessment & Plan Note (Signed)
Clinically euthyroid.  TSH done today. Continue levothyroxine 100 mcg daily. 

## 2022-03-18 NOTE — Assessment & Plan Note (Signed)
Stable.  Diet and nutrition discussed.  Continue rosuvastatin 20 mg daily. The 10-year ASCVD risk score (Arnett DK, et al., 2019) is: 1%   Values used to calculate the score:     Age: 51 years     Sex: Female     Is Non-Hispanic African American: No     Diabetic: No     Tobacco smoker: No     Systolic Blood Pressure: 038 mmHg     Is BP treated: No     HDL Cholesterol: 51 mg/dL     Total Cholesterol: 172 mg/dL

## 2022-03-18 NOTE — Patient Instructions (Signed)

## 2022-04-15 ENCOUNTER — Telehealth: Payer: Self-pay | Admitting: Gastroenterology

## 2022-04-15 NOTE — Telephone Encounter (Signed)
PT is calling in to have refill on levothyroxine.. Please reach out to advise. Thank you.

## 2022-04-17 ENCOUNTER — Telehealth: Payer: Self-pay | Admitting: Emergency Medicine

## 2022-04-17 NOTE — Telephone Encounter (Signed)
PT needs a refill of levothyroxine (SYNTHROID) 100 MCG tablet. PT is out of medication.   Please send to [facility]  Phone:  [phone]  Fax:  [phone]

## 2022-04-17 NOTE — Telephone Encounter (Signed)
Called patient with interpreter on phone  to inform her that her medication is too early to fill at the pharmacy . Patient has to call tomorrow to request a refill.

## 2022-04-19 NOTE — Telephone Encounter (Signed)
Patient said she now has her refill. Thanks

## 2022-05-29 ENCOUNTER — Other Ambulatory Visit: Payer: Self-pay | Admitting: Emergency Medicine

## 2022-05-29 DIAGNOSIS — Z1231 Encounter for screening mammogram for malignant neoplasm of breast: Secondary | ICD-10-CM

## 2022-07-10 ENCOUNTER — Ambulatory Visit
Admission: RE | Admit: 2022-07-10 | Discharge: 2022-07-10 | Disposition: A | Discharge status: Home / Self Care | Payer: BC Managed Care – PPO | Source: Ambulatory Visit | Attending: Emergency Medicine | Admitting: Emergency Medicine

## 2022-07-10 DIAGNOSIS — Z1231 Encounter for screening mammogram for malignant neoplasm of breast: Secondary | ICD-10-CM | POA: Diagnosis not present

## 2022-07-19 ENCOUNTER — Ambulatory Visit (INDEPENDENT_AMBULATORY_CARE_PROVIDER_SITE_OTHER): Payer: BC Managed Care – PPO | Admitting: Nurse Practitioner

## 2022-07-19 VITALS — BP 110/82 | HR 79 | Temp 97.9°F | Ht 64.0 in | Wt 173.4 lb

## 2022-07-19 DIAGNOSIS — J029 Acute pharyngitis, unspecified: Secondary | ICD-10-CM

## 2022-07-19 DIAGNOSIS — T1490XA Injury, unspecified, initial encounter: Secondary | ICD-10-CM

## 2022-07-19 DIAGNOSIS — T148XXA Other injury of unspecified body region, initial encounter: Secondary | ICD-10-CM | POA: Diagnosis not present

## 2022-07-19 LAB — POCT INFLUENZA A/B
Influenza A, POC: NEGATIVE
Influenza B, POC: NEGATIVE

## 2022-07-19 LAB — POCT RAPID STREP A (OFFICE): Rapid Strep A Screen: NEGATIVE

## 2022-07-19 LAB — POC COVID19 BINAXNOW: SARS Coronavirus 2 Ag: NEGATIVE

## 2022-07-19 MED ORDER — METHOCARBAMOL 500 MG PO TABS: 500.0000 mg | ORAL_TABLET | Freq: Two times a day (BID) | ORAL | 0 refills | Status: DC | PRN

## 2022-07-19 MED ORDER — MELOXICAM 15 MG PO TABS: 15.0000 mg | ORAL_TABLET | Freq: Every day | ORAL | 0 refills | Status: DC

## 2022-07-19 NOTE — Assessment & Plan Note (Signed)
Etiology of neck pain unclear however does seem muscular in origin we will treat with as needed meloxicam and Robaxin.  Patient educated not to drive or operate heavy machinery while taking getting Robaxin.

## 2022-07-19 NOTE — Assessment & Plan Note (Addendum)
Acute, point-of-care COVID, flu, and strep test negative.  Treat symptomatically with hydration as needed Tylenol, and call office if symptoms persist for another week or more.

## 2022-07-19 NOTE — Progress Notes (Signed)
Established Patient Office Visit  Subjective   Patient ID: Kelli Hale, female    DOB: 1971/07/16  Age: 51 y.o. MRN: [id number]  Chief Complaint  Patient presents with   Sore Throat   Interpreter present for entirety of visit.  Patient was here for the above.  Reports sore throat x1 week.  Feels like it is slowly getting better but was concerned she may have infection.  She reports having had a fall about 1 month ago, denies actually hitting the ground but did slip and started experiencing pain in her right leg.  Pain eventually resolved but seems to have come back a bit.  Has taken Tylenol as needed without much relief in pain.  Denies sensory changes or weakness in the leg.  For last 4 to 5 days has been having pain on the left side of her neck.  Raising her left arm can elicit the pain otherwise no alleviating or aggravating factors noted.  Rates pain as 7/10 when asked to characterize she reports it as "just there", seems to not cause pain unless moving her arm.     Review of Systems  Constitutional:  Negative for chills and fever.  HENT:  Positive for sore throat.   Respiratory:  Negative for cough, shortness of breath and wheezing.   Cardiovascular:  Negative for chest pain.  Neurological:  Negative for sensory change and weakness.      Objective:     BP 110/82   Pulse 79   Temp 97.9 F (36.6 C) (Oral)   Ht '5\' 4"'$  (1.626 m)   Wt 173 lb 6 oz (78.6 kg)   LMP 09/24/2019 (Exact Date)   SpO2 97%   BMI 29.76 kg/m    Physical Exam Vitals reviewed.  Constitutional:      General: She is not in acute distress.    Appearance: Normal appearance.  HENT:     Head: Normocephalic and atraumatic.  Neck:     Vascular: No carotid bruit.  Cardiovascular:     Rate and Rhythm: Normal rate and regular rhythm.     Pulses: Normal pulses.     Heart sounds: Normal heart sounds.  Pulmonary:     Effort: Pulmonary effort is normal.     Breath sounds: Normal breath sounds.   Musculoskeletal:     Cervical back: Tenderness (paraspinal muscular tenderness on left side) present. No swelling.     Right upper leg: Tenderness present. No swelling, deformity or bony tenderness.     Left upper leg: Normal.  Skin:    General: Skin is warm and dry.  Neurological:     General: No focal deficit present.     Mental Status: She is alert and oriented to person, place, and time.     Cranial Nerves: No cranial nerve deficit.     Sensory: Sensation is intact.     Motor: Motor function is intact.     Coordination: Coordination is intact.  Psychiatric:        Mood and Affect: Mood normal.        Behavior: Behavior normal.        Judgment: Judgment normal.      No results found for any visits on 07/19/22.    The 10-year ASCVD risk score (Arnett DK, et al., 2019) is: 1.5%    Assessment & Plan:   Problem List Items Addressed This Visit       Respiratory   Viral pharyngitis    Acute, point-of-care  COVID, flu, and strep test negative.  Treat symptomatically with hydration as needed Tylenol, and call office if symptoms persist for another week or more.        Musculoskeletal and Integument   Muscle strain - Primary    Etiology of neck pain unclear however does seem muscular in origin we will treat with as needed meloxicam and Robaxin.  Patient educated not to drive or operate heavy machinery while taking getting Robaxin.      Relevant Medications   methocarbamol (ROBAXIN) 500 MG tablet     Other   Soft tissue injury    Patient able to bear weight, mild tenderness noted to right hip with palpation.  For now recommend treating with as needed meloxicam, if symptoms persist may consider imaging.      Relevant Medications   meloxicam (MOBIC) 15 MG tablet    Return if symptoms worsen or fail to improve, for evalaution if you still feel ill in 1 week.  Total time spent on encounter today was greater than 30 minutes, this included face-to-face interaction with  patient, interpreting point-of-care test results, and developing and discussing treatment plan with patient.   Ailene Ards, NP

## 2022-07-19 NOTE — Addendum Note (Signed)
Addended by: Ferol Luz, Chamaine Stankus P on: 07/19/2022 03:27 PM   Modules accepted: Orders

## 2022-07-19 NOTE — Assessment & Plan Note (Signed)
Patient able to bear weight, mild tenderness noted to right hip with palpation.  For now recommend treating with as needed meloxicam, if symptoms persist may consider imaging.

## 2022-08-06 NOTE — Progress Notes (Signed)
51 y.o. G25P2003 Married  female here for annual exam.    No concerns.   PCP:   Dr. Mitchel Honour.   Patient's last menstrual period was 09/24/2019 (exact date).           Sexually active: Yes.    The current method of family planning is status post hysterectomy.    Exercising: Yes.    walking Smoker:  no  Health Maintenance: Pap:    05/27/18 Neg:Neg HR HPV  History of abnormal Pap:  no MMG:  07/10/22 Breast Density Category B, BI-RADS CATEGORY 1 Negative Colonoscopy:  04/30/18.  Due in 2014.  BMD:   n/a  Result  n/a TDaP:  01/02/18 Gardasil:   no HIV: neg in past Hep C: neg in past Screening Labs:  PCP.  She did flu, Covid and another vaccine (?RSV).   reports that she has never smoked. She has never used smokeless tobacco. She reports that she does not drink alcohol and does not use drugs.  Past Medical History:  Diagnosis Date   Allergy    Anemia    Constipation    occasionally- but daily or weekly    Elevated cholesterol    Fibroid    Hypothyroidism 2019   Hypothyroidism 2019   Thyroid disease 2005    Past Surgical History:  Procedure Laterality Date   CESAREAN SECTION     x 1   COLPOSCOPY     DILATATION & CURETTAGE/HYSTEROSCOPY WITH MYOSURE N/A 07/28/2018   Procedure: DILATATION & CURETTAGE/HYSTEROSCOPY WITH MYOSURE;  Surgeon: Nunzio Cobbs, MD;  Location: [facility];  Service: Gynecology;  Laterality: N/A;  rep will be here confirmed on 07/27/18   ROBOTIC ASSISTED TOTAL HYSTERECTOMY N/A 09/30/2019   Procedure: XI ROBOTIC ASSISTED TOTAL HYSTERECTOMY GREATER THAN 250 GRAMS;  Surgeon: Everitt Amber, MD;  Location: [facility];  Service: Gynecology;  Laterality: N/A;   TUBAL LIGATION     UPPER GASTROINTESTINAL ENDOSCOPY  01/16/2018   XI ROBOTIC ASSISTED SALPINGECTOMY N/A 09/30/2019   Procedure: XI ROBOTIC ASSISTED SALPINGECTOMY;  Surgeon: Everitt Amber, MD;  Location: [facility];  Service: Gynecology;  Laterality: N/A;    Current Outpatient Medications  Medication Sig  Dispense Refill   Calcium Carbonate (CALCIUM 600 PO) Take by mouth daily.     levothyroxine (SYNTHROID) 100 MCG tablet Take 1 tablet (100 mcg total) by mouth daily before breakfast. 90 tablet 3   meloxicam (MOBIC) 15 MG tablet Take 1 tablet (15 mg total) by mouth daily. Take only as needed. 30 tablet 0   methocarbamol (ROBAXIN) 500 MG tablet Take 1 tablet (500 mg total) by mouth 2 (two) times daily as needed for muscle spasms. 20 tablet 0   rosuvastatin (CRESTOR) 20 MG tablet Take 1 tablet (20 mg total) by mouth daily. 90 tablet 3   VITAMIN D PO Take by mouth daily.     No current facility-administered medications for this visit.    Family History  Problem Relation Age of Onset   Heart disease Mother    Rectal cancer Neg Hx    Stomach cancer Neg Hx    Colon cancer Neg Hx    Esophageal cancer Neg Hx    Colon polyps Neg Hx    Breast cancer Neg Hx     Review of Systems  All other systems reviewed and are negative.   Exam:   BP 120/80 (BP Location: Right Arm, Patient Position: Sitting, Cuff Size: Normal)   Pulse 80   Ht '5\' 4"'$  (  1.626 m)   Wt 178 lb (80.7 kg)   LMP 09/24/2019 (Exact Date)   BMI 30.55 kg/m     General appearance: alert, cooperative and appears stated age Head: normocephalic, without obvious abnormality, atraumatic Neck: no adenopathy, supple, symmetrical, trachea midline and thyroid normal to inspection and palpation Lungs: clear to auscultation bilaterally Breasts: normal appearance, no masses or tenderness, No nipple retraction or dimpling, No nipple discharge or bleeding, No axillary adenopathy Heart: regular rate and rhythm Abdomen: soft, non-tender; no masses, no organomegaly Extremities: extremities normal, atraumatic, no cyanosis or edema Skin: skin color, texture, turgor normal. No rashes or lesions Lymph nodes: cervical, supraclavicular, and axillary nodes normal. Neurologic: grossly normal  Pelvic: External genitalia:  no lesions              No  abnormal inguinal nodes palpated.              Urethra:  normal appearing urethra with no masses, tenderness or lesions              Bartholins and Skenes: normal                 Vagina: normal appearing vagina with normal color and discharge, no lesions              Cervix: absent              Pap taken: no Bimanual Exam:  Uterus:  absent              Adnexa: no mass, fullness, tenderness              Rectal exam: yes.  Confirms.              Anus:  normal sphincter tone, no lesions  Chaperone was present for exam:  Raquel Sarna  Assessment:   Well woman visit with gynecologic exam. Status post robotically assisted hysterectomy with bilateral salpingectomy.  Ovaries remain.  Hypothyroidism.   Managed by her PCP.   Plan: Mammogram screening discussed. Self breast awareness reviewed. Pap and HR HPV not indicated. Guidelines for Calcium, Vitamin D, regular exercise program including cardiovascular and weight bearing exercise. Colonoscopy next year.   Follow up annually and prn.  After visit summary provided.

## 2022-08-08 ENCOUNTER — Encounter: Payer: Self-pay | Admitting: Obstetrics and Gynecology

## 2022-08-08 ENCOUNTER — Ambulatory Visit (INDEPENDENT_AMBULATORY_CARE_PROVIDER_SITE_OTHER): Payer: BC Managed Care – PPO | Admitting: Obstetrics and Gynecology

## 2022-08-08 VITALS — BP 120/80 | HR 80 | Ht 64.0 in | Wt 178.0 lb

## 2022-08-08 DIAGNOSIS — Z01419 Encounter for gynecological examination (general) (routine) without abnormal findings: Secondary | ICD-10-CM

## 2022-08-08 NOTE — Patient Instructions (Signed)

## 2022-09-18 ENCOUNTER — Other Ambulatory Visit: Payer: Self-pay | Admitting: Emergency Medicine

## 2022-09-18 ENCOUNTER — Ambulatory Visit (INDEPENDENT_AMBULATORY_CARE_PROVIDER_SITE_OTHER): Payer: BC Managed Care – PPO | Admitting: Emergency Medicine

## 2022-09-18 ENCOUNTER — Encounter: Payer: Self-pay | Admitting: Emergency Medicine

## 2022-09-18 VITALS — BP 126/76 | HR 60 | Temp 98.1°F | Ht 64.0 in | Wt 176.1 lb

## 2022-09-18 DIAGNOSIS — E785 Hyperlipidemia, unspecified: Secondary | ICD-10-CM

## 2022-09-18 DIAGNOSIS — Z1211 Encounter for screening for malignant neoplasm of colon: Secondary | ICD-10-CM | POA: Diagnosis not present

## 2022-09-18 DIAGNOSIS — Z23 Encounter for immunization: Secondary | ICD-10-CM

## 2022-09-18 DIAGNOSIS — E039 Hypothyroidism, unspecified: Secondary | ICD-10-CM | POA: Diagnosis not present

## 2022-09-18 DIAGNOSIS — K831 Obstruction of bile duct: Secondary | ICD-10-CM

## 2022-09-18 DIAGNOSIS — Z86010 Personal history of colonic polyps: Secondary | ICD-10-CM

## 2022-09-18 LAB — COMPREHENSIVE METABOLIC PANEL
ALT: 68 U/L — ABNORMAL HIGH (ref 0–35)
AST: 43 U/L — ABNORMAL HIGH (ref 0–37)
Albumin: 4.1 g/dL (ref 3.5–5.2)
Alkaline Phosphatase: 137 U/L — ABNORMAL HIGH (ref 39–117)
BUN: 16 mg/dL (ref 6–23)
CO2: 28 mEq/L (ref 19–32)
Calcium: 8.6 mg/dL (ref 8.4–10.5)
Chloride: 104 mEq/L (ref 96–112)
Creatinine, Ser: 0.59 mg/dL (ref 0.40–1.20)
GFR: 104.52 mL/min (ref >60.00)
Glucose, Bld: 91 mg/dL (ref 70–99)
Potassium: 4.1 mEq/L (ref 3.5–5.1)
Sodium: 142 mEq/L (ref 135–145)
Total Bilirubin: 0.5 mg/dL (ref 0.2–1.2)
Total Protein: 7.3 g/dL (ref 6.0–8.3)

## 2022-09-18 LAB — LIPID PANEL
Cholesterol: 129 mg/dL (ref 0–200)
HDL: 48.5 mg/dL (ref >39.00)
LDL Cholesterol: 63 mg/dL (ref 0–99)
NonHDL: 80.54
Total CHOL/HDL Ratio: 3
Triglycerides: 90 mg/dL (ref 0.0–149.0)
VLDL: 18 mg/dL (ref 0.0–40.0)

## 2022-09-18 LAB — TSH: TSH: 1.75 u[IU]/mL (ref 0.35–5.50)

## 2022-09-18 LAB — HEMOGLOBIN A1C: Hgb A1c MFr Bld: 6 % (ref 4.6–6.5)

## 2022-09-18 MED ORDER — ROSUVASTATIN CALCIUM 20 MG PO TABS: 20.0000 mg | ORAL_TABLET | Freq: Every day | ORAL | 3 refills | Status: DC

## 2022-09-18 NOTE — Progress Notes (Signed)
Kelli Hale 52 y.o.   Chief Complaint  Patient presents with   Follow-up    62mth f/u appt, no concerns    HISTORY OF PRESENT ILLNESS: This is a 52y.o. female here for 622-monthollow-up of hypothyroidism and dyslipidemia. Recent gynecology evaluation last November.  No concerns. Overall doing well. Wt Readings from Last 3 Encounters:  09/18/22 176 lb 2 oz (79.9 kg)  08/08/22 178 lb (80.7 kg)  07/19/22 173 lb 6 oz (78.6 kg)     HPI   Prior to Admission medications   Medication Sig Start Date End Date Taking? Authorizing Provider  Calcium Carbonate (CALCIUM 600 PO) Take by mouth daily.   Yes [provider]  levothyroxine (SYNTHROID) 100 MCG tablet Take 1 tablet (100 mcg total) by mouth daily before breakfast. 11/16/21  Yes Trayden Brandy, MiInes BloomerMD  meloxicam (MOBIC) 15 MG tablet Take 1 tablet (15 mg total) by mouth daily. Take only as needed. 07/19/22  Yes GrAilene ArdsNP  methocarbamol (ROBAXIN) 500 MG tablet Take 1 tablet (500 mg total) by mouth 2 (two) times daily as needed for muscle spasms. 07/19/22  Yes GrAilene ArdsNP  rosuvastatin (CRESTOR) 20 MG tablet Take 1 tablet (20 mg total) by mouth daily. 03/08/21  Yes Afreen Siebels, Ines BloomerMD  VITAMIN D PO Take by mouth daily.   Yes [provider]    No Known Allergies  Patient Active Problem List   Diagnosis Date Noted   Dyslipidemia 08/18/2019   Hypothyroidism 06/28/2018    Past Medical History:  Diagnosis Date   Allergy    Anemia    Constipation    occasionally- but daily or weekly    Elevated cholesterol    Fibroid    Hypothyroidism 2019   Hypothyroidism 2019   Thyroid disease 2005    Past Surgical History:  Procedure Laterality Date   CESAREAN SECTION     x 1   COLPOSCOPY     DILATATION & CURETTAGE/HYSTEROSCOPY WITH MYOSURE N/A 07/28/2018   Procedure: DILATATION & CURETTAGE/HYSTEROSCOPY WITH MYOSURE;  Surgeon: AmNunzio CobbsMD;  [facility];   Service: Gynecology;  Laterality: N/A;  rep will be here confirmed on 07/27/18   ROBOTIC ASSISTED TOTAL HYSTERECTOMY N/A 09/30/2019   Procedure: XI ROBOTIC ASSISTED TOTAL HYSTERECTOMY GREATER THAN 250 GRAMS;  Surgeon: RoEveritt AmberMD;  Location: [facility];  Service: Gynecology;  Laterality: N/A;   TUBAL LIGATION     UPPER GASTROINTESTINAL ENDOSCOPY  01/16/2018   XI ROBOTIC ASSISTED SALPINGECTOMY N/A 09/30/2019   Procedure: XI ROBOTIC ASSISTED SALPINGECTOMY;  Surgeon: RoEveritt AmberMD;  Location: [facility];  Service: Gynecology;  Laterality: N/A;    Social History   Socioeconomic History   Marital status: Married    Spouse name: Not on file   Number of children: 3   Years of education: Not on file   Highest education level: Not on file  Occupational History   Not on file  Tobacco Use   Smoking status: Never   Smokeless tobacco: Never  Vaping Use   Vaping Use: Never used  Substance and Sexual Activity   Alcohol use: No   Drug use: No   Sexual activity: Yes    Birth control/protection: Surgical    Comment: Hyst  Other Topics Concern   Not on file  Social History Narrative   Not on file   Social Determinants of Health   Financial Resource Strain: Not on file  Food Insecurity:  Not on file  Transportation Needs: Not on file  Physical Activity: Not on file  Stress: Not on file  Social Connections: Not on file  Intimate Partner Violence: Not on file    Family History  Problem Relation Age of Onset   Heart disease Mother    Rectal cancer Neg Hx    Stomach cancer Neg Hx    Colon cancer Neg Hx    Esophageal cancer Neg Hx    Colon polyps Neg Hx    Breast cancer Neg Hx      Review of Systems  Constitutional: Negative.  Negative for chills and fever.  HENT: Negative.  Negative for congestion and sore throat.   Respiratory: Negative.  Negative for cough and shortness of breath.   Cardiovascular: Negative.  Negative for chest pain and palpitations.  Gastrointestinal:  Negative  for abdominal pain, diarrhea, nausea and vomiting.  Skin: Negative.  Negative for rash.  Neurological: Negative.  Negative for dizziness and headaches.  All other systems reviewed and are negative.   Today's Vitals   09/18/22 0758  BP: 126/76  Pulse: 60  Temp: 98.1 F (36.7 C)  TempSrc: Oral  SpO2: 95%  Weight: 176 lb 2 oz (79.9 kg)  Height: '5\' 4"'$  (1.626 m)   Body mass index is 30.23 kg/m.  Physical Exam Vitals reviewed.  Constitutional:      Appearance: Normal appearance.  HENT:     Head: Normocephalic.  Eyes:     Extraocular Movements: Extraocular movements intact.     Pupils: Pupils are equal, round, and reactive to light.  Cardiovascular:     Rate and Rhythm: Normal rate and regular rhythm.     Pulses: Normal pulses.     Heart sounds: Normal heart sounds.  Pulmonary:     Effort: Pulmonary effort is normal.     Breath sounds: Normal breath sounds.  Abdominal:     Palpations: Abdomen is soft.     Tenderness: There is no abdominal tenderness.  Musculoskeletal:     Cervical back: No tenderness.  Lymphadenopathy:     Cervical: No cervical adenopathy.  Skin:    General: Skin is warm and dry.  Neurological:     General: No focal deficit present.     Mental Status: She is alert and oriented to person, place, and time.  Psychiatric:        Mood and Affect: Mood normal.        Behavior: Behavior normal.      ASSESSMENT & PLAN: A total of 45 minutes was spent with the patient and counseling/coordination of care regarding preparing for this visit, review of most recent office visit notes, review of multiple chronic medical conditions and their management, review of all medications, review of most recent blood work results, review of colonoscopy report from 2019, review of health maintenance items, review of most recent gynecological office visit notes, education on nutrition, cardiovascular risks associated with dyslipidemia, need for blood work today, prognosis,  documentation, need for follow-up in 6 months.  Problem List Items Addressed This Visit       Endocrine   Hypothyroidism - Primary    Clinically euthyroid.  TSH done today. Continue levothyroxine 100 mcg daily.      Relevant Orders   TSH     Other   Dyslipidemia    Stable.  Diet and nutrition discussed. Abnormal lipid profile last July but she was not taking rosuvastatin. Has been compliant with rosuvastatin 20 mg daily.  Lipid  profile done today. Cardiovascular risk associated with dyslipidemia discussed. Diet and nutrition discussed. Continue daily rosuvastatin 20 mg.      Relevant Orders   Comprehensive metabolic panel   Lipid panel   Hemoglobin A1c   History of colonic polyps    Last colonoscopy in 2019.  Report reviewed.  Sessile polyp found. Needs follow-up colonoscopy.      Relevant Orders   Ambulatory referral to Gastroenterology   Other Visit Diagnoses     Need for vaccination       Relevant Orders   Flu Vaccine QUAD 6+ mos PF IM (Fluarix Quad PF)   Zoster Recombinant (Shingrix )   Colon cancer screening       Relevant Orders   Ambulatory referral to Gastroenterology      Patient Instructions  Mantenimiento de la salud en [place] Maintenance, Female Adoptar un estilo de vida saludable y recibir atencin preventiva son importantes para promover la salud y Musician. Consulte al mdico sobre: El esquema adecuado para hacerse pruebas y exmenes peridicos. Cosas que puede hacer por su cuenta para prevenir enfermedades y [place] sano. Qu debo saber sobre la dieta, el peso y el ejercicio? Consuma una dieta saludable  Consuma una dieta que incluya muchas verduras, frutas, productos lcteos con bajo contenido de [place] y Advertising account planner. No consuma muchos alimentos ricos en grasas slidas, azcares agregados o sodio. Mantenga un peso saludable El ndice de masa muscular [facility]) se [place] para identificar problemas de [place]. Proporciona  una estimacin de la grasa corporal basndose en el peso y la altura. Su mdico puede ayudarle a Radiation protection practitioner [address] y a Scientist, forensic o Theatre manager un peso saludable. Haga ejercicio con regularidad Haga ejercicio con regularidad. Esta es una de las prcticas ms importantes que puede hacer por su salud. La [employer] de los adultos deben seguir estas pautas: Optometrist, al menos, 150 minutos de actividad fsica por semana. El ejercicio debe aumentar la frecuencia cardaca y Nature conservation officer transpirar (ejercicio de intensidad moderada). Hacer ejercicios de fortalecimiento por lo [employer] por semana. Agregue esto a su plan de ejercicio de intensidad moderada. Pase menos tiempo sentada. Incluso la actividad fsica ligera puede ser beneficiosa. Controle sus niveles de colesterol y lpidos en la sangre Comience a realizarse anlisis de lpidos y Research officer, trade union en la sangre a los 70 aos y luego reptalos cada 5 aos. Hgase controlar los niveles de colesterol con mayor frecuencia si: Sus niveles de lpidos y colesterol son altos. Es mayor de 34 aos. Presenta un alto riesgo de padecer enfermedades cardacas. Qu debo saber sobre las pruebas de deteccin del cncer? Segn su historia clnica y sus antecedentes familiares, es posible que deba realizarse pruebas de deteccin del cncer en diferentes edades. Esto puede incluir pruebas de deteccin de lo siguiente: Cncer de mama. Cncer de cuello uterino. Cncer colorrectal. Cncer de piel. Cncer de pulmn. Qu debo saber sobre la enfermedad cardaca, la diabetes y la hipertensin arterial? Presin arterial y enfermedad cardaca La hipertensin arterial causa enfermedades cardacas y [place] el riesgo de accidente cerebrovascular. Es ms probable que esto se manifieste en las personas que tienen lecturas de presin arterial alta o tienen sobrepeso. Hgase controlar la presin arterial: Cada 3 a 5 aos si tiene entre 18 y 85 aos. Todos los aos si es mayor de 40  aos. Diabetes Realcese exmenes de deteccin de la diabetes con regularidad. Este anlisis revisa el nivel de azcar en la sangre en [place]. Hgase las pruebas de deteccin: Cada tres  aos despus de los 62 aos de edad si tiene un peso normal y un bajo riesgo de padecer diabetes. Con ms frecuencia y a partir de [place] edad inferior si tiene sobrepeso o un alto riesgo de padecer diabetes. Qu debo saber sobre la prevencin de infecciones? Hepatitis B Si tiene un riesgo ms alto de contraer hepatitis B, debe someterse a un examen de deteccin de este virus. Hable con el mdico para averiguar si tiene riesgo de contraer la infeccin por hepatitis B. Hepatitis C Se recomienda el anlisis a: [employer] 1945 y 1965. Todas las personas que tengan un riesgo de haber contrado hepatitis C. Enfermedades de transmisin sexual (ETS) Hgase las pruebas de Programme researcher, broadcasting/film/video de ITS, incluidas la gonorrea y la clamidia, si: Es sexualmente activa y es menor de 11 aos. Es mayor de 5 aos, y Investment banker, operational informa que corre riesgo de tener este tipo de infecciones. La actividad sexual ha cambiado desde que le hicieron la ltima prueba de deteccin y tiene un riesgo mayor de Best boy clamidia o Radio broadcast assistant. Pregntele al mdico si usted tiene riesgo. Pregntele al mdico si usted tiene un alto riesgo de Museum/gallery curator VIH. El mdico tambin puede recomendarle un medicamento recetado para ayudar a evitar la infeccin por el VIH. Si elige tomar medicamentos para prevenir el VIH, primero debe [employer] de deteccin del VIH. Luego debe hacerse anlisis cada 3 meses mientras est tomando los medicamentos. Embarazo Si est por dejar de Librarian, academic (fase premenopusica) y usted puede quedar [place], busque asesoramiento antes de [place]. Tome de 400 a 800 microgramos (mcg) de cido [employer] si [place]. Pida mtodos de control de la natalidad (anticonceptivos) si desea evitar un  embarazo no deseado. Osteoporosis y [place] La osteoporosis es una enfermedad en la que los huesos pierden los minerales y la fuerza por el avance de la edad. El resultado pueden ser fracturas en los [place]. Si tiene 3 aos o ms, o si est en riesgo de sufrir osteoporosis y fracturas, pregunte a su mdico si debe: Hacerse pruebas de deteccin de prdida sea. Tomar un suplemento de calcio o de vitamina D para reducir el riesgo de fracturas. Recibir terapia de reemplazo hormonal (TRH) para tratar los sntomas de la menopausia. Siga estas indicaciones en su casa: Consumo de alcohol No beba alcohol si: Su mdico le indica no hacerlo. Est embarazada, puede estar embarazada o est tratando de [place]. Si bebe alcohol: Limite la cantidad que bebe a lo siguiente: De 0 a 1 bebida por da. Sepa cunta cantidad de alcohol hay en las bebidas que toma. En los [place], una medida equivale a una botella de cerveza de 12 oz (355 ml), un vaso de vino de 5 oz (148 ml) o un vaso de una bebida alcohlica de alta graduacin de 1 oz (44 ml). Estilo de vida No consuma ningn producto que contenga nicotina o tabaco. Estos productos incluyen cigarrillos, tabaco para Higher education careers adviser y aparatos de vapeo, como los Psychologist, sport and exercise. Si necesita ayuda para dejar de consumir estos productos, consulte al mdico. No consuma drogas. No comparta agujas. Solicite ayuda a su mdico si necesita apoyo o informacin para abandonar las drogas. Indicaciones generales Realcese los estudios de rutina de la salud, dentales y de Public librarian. [place]. Infrmele a su mdico si: Se siente deprimida con frecuencia. Alguna vez ha sido vctima de [place] o no se siente seguro en su casa. Resumen Adoptar un estilo  de vida saludable y recibir atencin preventiva son importantes para promover la salud y Musician. Siga las instrucciones del mdico acerca de una dieta saludable, el ejercicio  y la realizacin de pruebas o exmenes para Engineer, building services. Siga las instrucciones del mdico con respecto al control del colesterol y la presin arterial. Esta informacin no tiene Marine scientist el consejo del mdico. Asegrese de hacerle al mdico cualquier pregunta que tenga. Document Revised: 02/01/2021 Document Reviewed: 02/01/2021 Elsevier Patient Education  [place], MD [facility]

## 2022-09-18 NOTE — Patient Instructions (Signed)

## 2022-09-18 NOTE — Assessment & Plan Note (Signed)
Clinically euthyroid.  TSH done today. Continue levothyroxine 100 mcg daily.

## 2022-09-18 NOTE — Assessment & Plan Note (Signed)
Stable.  Diet and nutrition discussed. Abnormal lipid profile last July but she was not taking rosuvastatin. Has been compliant with rosuvastatin 20 mg daily.  Lipid profile done today. Cardiovascular risk associated with dyslipidemia discussed. Diet and nutrition discussed. Continue daily rosuvastatin 20 mg.

## 2022-09-18 NOTE — Assessment & Plan Note (Signed)
Last colonoscopy in 2019.  Report reviewed.  Sessile polyp found. Needs follow-up colonoscopy.

## 2022-09-19 ENCOUNTER — Telehealth: Payer: Self-pay | Admitting: Emergency Medicine

## 2022-09-19 NOTE — Telephone Encounter (Signed)
Pt is confused about why she needs a scan of her liver, says Dr. Mitchel Honour never told her she needed to do one and she would like an explination.   Pt is requesting a call with a translator this afternoon at:   [phone]   Pt is also requesting a refill of her thyroid medication  Caller & Relationship to patient: Self  Call back number: [phone]   Date of last office visit: 1.10.24  Date of next office visit: 7.17.24  Medication(s) to be refilled:  levothyroxine (SYNTHROID) 100 MCG tablet   Preferred Pharmacy:   [facility]   Phone: [phone]  Fax: [phone]

## 2022-09-19 NOTE — Telephone Encounter (Signed)
Elevated alkaline phosphatase and liver enzymes.  Need to rule out gallstones.  She needs a gallbladder ultrasound which was order yesterday.

## 2022-09-20 NOTE — Telephone Encounter (Signed)
Called patient with interpreter on the phone to inform her of provider response

## 2022-09-22 ENCOUNTER — Other Ambulatory Visit: Payer: Self-pay | Admitting: Emergency Medicine

## 2022-09-22 DIAGNOSIS — E039 Hypothyroidism, unspecified: Secondary | ICD-10-CM

## 2022-10-04 ENCOUNTER — Ambulatory Visit
Admission: RE | Admit: 2022-10-04 | Discharge: 2022-10-04 | Disposition: A | Discharge status: Home / Self Care | Payer: BC Managed Care – PPO | Source: Ambulatory Visit | Attending: Emergency Medicine | Admitting: Emergency Medicine

## 2022-10-04 DIAGNOSIS — K831 Obstruction of bile duct: Secondary | ICD-10-CM

## 2022-10-04 DIAGNOSIS — K824 Cholesterolosis of gallbladder: Secondary | ICD-10-CM | POA: Diagnosis not present

## 2023-01-06 ENCOUNTER — Other Ambulatory Visit: Payer: Self-pay | Admitting: Emergency Medicine

## 2023-01-06 DIAGNOSIS — E039 Hypothyroidism, unspecified: Secondary | ICD-10-CM

## 2023-03-14 ENCOUNTER — Encounter: Payer: Self-pay | Admitting: Gastroenterology

## 2023-03-26 ENCOUNTER — Encounter: Payer: Self-pay | Admitting: Emergency Medicine

## 2023-03-26 ENCOUNTER — Ambulatory Visit: Payer: BC Managed Care – PPO | Admitting: Emergency Medicine

## 2023-03-26 VITALS — BP 120/78 | HR 55 | Temp 98.0°F | Ht 64.0 in | Wt 165.5 lb

## 2023-03-26 DIAGNOSIS — E890 Postprocedural hypothyroidism: Secondary | ICD-10-CM

## 2023-03-26 DIAGNOSIS — B3731 Acute candidiasis of vulva and vagina: Secondary | ICD-10-CM | POA: Diagnosis not present

## 2023-03-26 DIAGNOSIS — E785 Hyperlipidemia, unspecified: Secondary | ICD-10-CM | POA: Diagnosis not present

## 2023-03-26 LAB — CBC WITH DIFFERENTIAL/PLATELET
Basophils Absolute: 0 10*3/uL (ref 0.0–0.1)
Basophils Relative: 0.6 % (ref 0.0–3.0)
Eosinophils Absolute: 0.1 10*3/uL (ref 0.0–0.7)
Eosinophils Relative: 2.3 % (ref 0.0–5.0)
HCT: 42.6 % (ref 36.0–46.0)
Hemoglobin: 13.9 g/dL (ref 12.0–15.0)
Lymphocytes Relative: 36.3 % (ref 12.0–46.0)
Lymphs Abs: 1.8 10*3/uL (ref 0.7–4.0)
MCHC: 32.6 g/dL (ref 30.0–36.0)
MCV: 81 fl (ref 78.0–100.0)
Monocytes Absolute: 0.3 10*3/uL (ref 0.1–1.0)
Monocytes Relative: 6.8 % (ref 3.0–12.0)
Neutro Abs: 2.6 10*3/uL (ref 1.4–7.7)
Neutrophils Relative %: 54 % (ref 43.0–77.0)
Platelets: 228 10*3/uL (ref 150.0–400.0)
RBC: 5.26 Mil/uL — ABNORMAL HIGH (ref 3.87–5.11)
RDW: 13.3 % (ref 11.5–15.5)
WBC: 4.9 10*3/uL (ref 4.0–10.5)

## 2023-03-26 LAB — COMPREHENSIVE METABOLIC PANEL
ALT: 23 U/L (ref 0–35)
AST: 21 U/L (ref 0–37)
Albumin: 4.4 g/dL (ref 3.5–5.2)
Alkaline Phosphatase: 75 U/L (ref 39–117)
BUN: 17 mg/dL (ref 6–23)
CO2: 28 mEq/L (ref 19–32)
Calcium: 9.3 mg/dL (ref 8.4–10.5)
Chloride: 104 mEq/L (ref 96–112)
Creatinine, Ser: 0.61 mg/dL (ref 0.40–1.20)
GFR: 103.31 mL/min (ref >60.00)
Glucose, Bld: 91 mg/dL (ref 70–99)
Potassium: 4.3 mEq/L (ref 3.5–5.1)
Sodium: 140 mEq/L (ref 135–145)
Total Bilirubin: 1 mg/dL (ref 0.2–1.2)
Total Protein: 7.4 g/dL (ref 6.0–8.3)

## 2023-03-26 LAB — HEMOGLOBIN A1C: Hgb A1c MFr Bld: 5.8 % (ref 4.6–6.5)

## 2023-03-26 LAB — LIPID PANEL
Cholesterol: 150 mg/dL (ref 0–200)
HDL: 54.3 mg/dL (ref >39.00)
LDL Cholesterol: 77 mg/dL (ref 0–99)
NonHDL: 95.22
Total CHOL/HDL Ratio: 3
Triglycerides: 92 mg/dL (ref 0.0–149.0)
VLDL: 18.4 mg/dL (ref 0.0–40.0)

## 2023-03-26 LAB — TSH: TSH: 1.69 u[IU]/mL (ref 0.35–5.50)

## 2023-03-26 MED ORDER — FLUCONAZOLE 150 MG PO TABS: 150.0000 mg | ORAL_TABLET | Freq: Once | ORAL | 0 refills | Status: AC

## 2023-03-26 NOTE — Patient Instructions (Signed)
Hipotiroidismo Hypothyroidism  El hipotiroidismo ocurre cuando la glndula tiroidea no produce la cantidad suficiente de ciertas hormonas. Esto se denomina tiroides hipoactiva. La glndula tiroidea es una pequea glndula ubicada en la parte delantera inferior del cuello, justo delante de la trquea. Esta glndula produce hormonas que ayudan a Scientist, physiological forma en la que el cuerpo [place] los alimentos para obtener energa (metabolismo) as como tambin la funcin cardaca y la funcin cerebral. Estas hormonas tambin juegan un papel para [employer] huesos fuertes. Cuando la tiroides es hipoactiva, produce muy poca cantidad de las hormonas tiroxina (T4) y triyodotironina (T3). Cules son las causas? Esta afeccin puede ser causada por lo siguiente: Enfermedad de Hashimoto. Se trata de una enfermedad por la cual el sistema del cuerpo encargado de combatir las enfermedades (sistema inmunitario) ataca la glndula tiroidea. Esta es la causa ms frecuente. Infecciones virales. Embarazo. Ciertos medicamentos. Defectos congnitos. Problemas con Neomia Dear glndula ubicada en el centro del cerebro (hipfisis). Falta de una cantidad suficiente de yodo en la dieta. Algunas otras causas son las siguientes: Radioterapias anteriores en la cabeza o el cuello para Management consultant. Tratamiento previo con yodo radioactivo. Exposicin a la radiacin en el ambiente, en el pasado. Extirpacin quirrgica previa de una parte o de toda la tiroides. Qu incrementa el riesgo? Es ms probable que sufra esta afeccin si: Es mujer. Tiene antecedentes familiares de afecciones tiroideas. [place] un medicamento denominado litio. Toma medicamentos que afectan el sistema inmunitario (inmunosupresores). Cules son los signos o sntomas? Los sntomas frecuentes de esta afeccin incluyen los siguientes: Incapacidad para tolerar el fro. Sensacin de falta de energa (letargo). Falta de apetito. Estreimiento. Tristeza o  depresin. Aumento de peso que no puede explicarse por un cambio en la dieta o en los hbitos de ejercicio fsico. Irregularidades menstruales. Piel seca, cabello grueso o uas quebradizas. Otros sntomas pueden incluir: Dolor muscular. Ralentizacin de los procesos de pensamiento. Mala memoria. Cmo se diagnostica? Esta afeccin se puede diagnosticar en funcin de lo siguiente: Los sntomas, los antecedentes mdicos y un examen fsico. Anlisis de Retail buyer. Tambin puede someterse a estudios por imgenes como una ecografa o una resonancia magntica (RM). Cmo se trata? Esta afeccin se trata con medicamentos que reemplazan las hormonas tiroideas que el cuerpo no produce. Despus de Microbiologist, pueden pasar varias semanas hasta la desaparicin de los sntomas. Siga estas indicaciones en su casa: Use los medicamentos de venta libre y los recetados solamente como se lo haya indicado el mdico. Si empieza a tomar medicamentos nuevos, infrmele al mdico. Oceanographer a todas las visitas de seguimiento como se lo haya indicado el mdico. Esto es importante. A medida que la afeccin mejora, es posible que haya que modificar las dosis de los medicamentos de hormona tiroidea. Tendr que hacerse anlisis de sangre peridicamente, de modo que el mdico pueda Passenger transport manager. Comunquese con un mdico si: Los sntomas no mejoran con Scientist, research (medical). Est tomando medicamentos de reemplazo de la hormona tiroidea y: Wendall Stade. Tiene temblores. Se siente ansioso. [place]aja de peso rpidamente. No puede Patent examiner. Tiene cambios emocionales. Tiene diarrea. Se siente dbil. Solicite ayuda de inmediato si: Midwife. Tiene latidos cardacos irregulares. Tiene latidos cardacos acelerados. Tiene dificultad para respirar. Estos sntomas pueden Customer service manager. Solicite ayuda de inmediato. Llame al 911. No espere a ver si los sntomas desaparecen. No conduzca  por sus propios medios [employer]. Resumen El hipotiroidismo ocurre cuando la glndula tiroidea no produce la cantidad suficiente de  ciertas hormonas (es hipoactiva). Cuando la tiroides es hipoactiva, produce muy poca cantidad de las hormonas tiroxina (T4) y triyodotironina (T3). La causa ms frecuente es la enfermedad de Hashimoto, una enfermedad por la cual el sistema del cuerpo encargado de combatir las enfermedades (sistema inmunitario) ataca la glndula tiroidea. La afeccin tambin puede ser consecuencia de infecciones virales, medicamentos, el embarazo o luego de un tratamiento de radioterapia en la cabeza o el cuello. Los sntomas pueden incluir aumento de [place], piel seca, estreimiento, sensacin de no tener energa e incapacidad para [employer] fro. Esta afeccin se trata con medicamentos que reemplazan las hormonas tiroideas que el cuerpo no produce. Esta informacin no tiene Theme park manager el consejo del mdico. Asegrese de hacerle al mdico cualquier pregunta que tenga. Document Revised: 09/27/2021 Document Reviewed: 09/27/2021 Elsevier Patient Education  2024 [employer].

## 2023-03-26 NOTE — Progress Notes (Signed)
Kelli Hale 52 y.o.   Chief Complaint  Patient presents with   Medical Management of Chronic Issues    f/u appt, poss vaginal infection     HISTORY OF PRESENT ILLNESS: This is a 52 y.o. female here for 57-month follow-up of chronic medical conditions including hypothyroidism and dyslipidemia Also complaining of yeast vaginal infection.  Complaining of itchy vaginal white discharge No other complaints or medical concerns today  HPI   Prior to Admission medications   Medication Sig Start Date End Date Taking? Authorizing Provider  Calcium Carbonate (CALCIUM 600 PO) Take by mouth daily.   Yes [provider]  levothyroxine (SYNTHROID) 100 MCG tablet TAKE 1 TABLET BY MOUTH ONCE DAILY BEFORE BREAKFAST 01/06/23  Yes Erbie Arment, Eilleen Kempf, MD  meloxicam (MOBIC) 15 MG tablet Take 1 tablet (15 mg total) by mouth daily. Take only as needed. 07/19/22  Yes Elenore Paddy, NP  methocarbamol (ROBAXIN) 500 MG tablet Take 1 tablet (500 mg total) by mouth 2 (two) times daily as needed for muscle spasms. 07/19/22  Yes Elenore Paddy, NP  rosuvastatin (CRESTOR) 20 MG tablet Take 1 tablet (20 mg total) by mouth daily. 09/18/22  Yes SagardiaEilleen Kempf, MD  VITAMIN D PO Take by mouth daily.   Yes [provider]    No Known Allergies  Patient Active Problem List   Diagnosis Date Noted   History of colonic polyps 09/18/2022   Dyslipidemia 08/18/2019   Hypothyroidism 06/28/2018    Past Medical History:  Diagnosis Date   Allergy    Anemia    Constipation    occasionally- but daily or weekly    Elevated cholesterol    Fibroid    Hypothyroidism 2019   Hypothyroidism 2019   Thyroid disease 2005    Past Surgical History:  Procedure Laterality Date   CESAREAN SECTION     x 1   COLPOSCOPY     DILATATION & CURETTAGE/HYSTEROSCOPY WITH MYOSURE N/A 07/28/2018   Procedure: DILATATION & CURETTAGE/HYSTEROSCOPY WITH MYOSURE;  Surgeon: Patton Salles, MD;   Location: [facility];  Service: Gynecology;  Laterality: N/A;  rep will be here confirmed on 07/27/18   ROBOTIC ASSISTED TOTAL HYSTERECTOMY N/A 09/30/2019   Procedure: XI ROBOTIC ASSISTED TOTAL HYSTERECTOMY GREATER THAN 250 GRAMS;  Surgeon: Adolphus Birchwood, MD;  Location: [facility];  Service: Gynecology;  Laterality: N/A;   TUBAL LIGATION     UPPER GASTROINTESTINAL ENDOSCOPY  01/16/2018   XI ROBOTIC ASSISTED SALPINGECTOMY N/A 09/30/2019   Procedure: XI ROBOTIC ASSISTED SALPINGECTOMY;  Surgeon: Adolphus Birchwood, MD;  Location: [facility];  Service: Gynecology;  Laterality: N/A;    Social History   Socioeconomic History   Marital status: Married    Spouse name: Not on file   Number of children: 3   Years of education: Not on file   Highest education level: Not on file  Occupational History   Not on file  Tobacco Use   Smoking status: Never   Smokeless tobacco: Never  Vaping Use   Vaping status: Never Used  Substance and Sexual Activity   Alcohol use: No   Drug use: No   Sexual activity: Yes    Birth control/protection: Surgical    Comment: Hyst  Other Topics Concern   Not on file  Social History Narrative   Not on file   Social Determinants of Health   Financial Resource Strain: Not on file  Food Insecurity: Not on file  Transportation Needs: Not  on file  Physical Activity: Not on file  Stress: Not on file  Social Connections: Not on file  Intimate Partner Violence: Not on file    Family History  Problem Relation Age of Onset   Heart disease Mother    Rectal cancer Neg Hx    Stomach cancer Neg Hx    Colon cancer Neg Hx    Esophageal cancer Neg Hx    Colon polyps Neg Hx    Breast cancer Neg Hx      Review of Systems  Constitutional: Negative.  Negative for chills and fever.  HENT: Negative.  Negative for congestion and sore throat.   Respiratory: Negative.  Negative for cough and shortness of breath.   Cardiovascular: Negative.  Negative for chest pain and palpitations.   Gastrointestinal:  Negative for abdominal pain, diarrhea, nausea and vomiting.  Genitourinary: Negative.  Negative for dysuria and hematuria.       White vaginal discharge, itchy  Skin: Negative.  Negative for rash.  Neurological: Negative.  Negative for dizziness and headaches.  All other systems reviewed and are negative.   Vitals:   03/26/23 0800  BP: 120/78  Pulse: (!) 55  Temp: 98 F (36.7 C)  SpO2: 97%    Physical Exam Vitals reviewed.  Constitutional:      Appearance: Normal appearance.  HENT:     Head: Normocephalic.     Mouth/Throat:     Mouth: Mucous membranes are moist.     Pharynx: Oropharynx is clear.  Eyes:     Extraocular Movements: Extraocular movements intact.     Pupils: Pupils are equal, round, and reactive to light.  Cardiovascular:     Rate and Rhythm: Normal rate and regular rhythm.     Pulses: Normal pulses.     Heart sounds: Normal heart sounds.  Pulmonary:     Effort: Pulmonary effort is normal.     Breath sounds: Normal breath sounds.  Musculoskeletal:     Cervical back: No tenderness.     Right lower leg: No edema.     Left lower leg: No edema.  Lymphadenopathy:     Cervical: No cervical adenopathy.  Skin:    General: Skin is warm and dry.     Capillary Refill: Capillary refill takes less than 2 seconds.  Neurological:     General: No focal deficit present.     Mental Status: She is alert and oriented to person, place, and time.  Psychiatric:        Mood and Affect: Mood normal.        Behavior: Behavior normal.     ASSESSMENT & PLAN: A total of 45 minutes was spent with the patient and counseling/coordination of care regarding preparing for this visit, review of most recent office visit notes, review of multiple chronic medical conditions under management, review of all medications, review of most recent blood work results, education on nutrition, prognosis, documentation, review of health maintenance items, planning for  follow-up.  Problem List Items Addressed This Visit       Endocrine   Hypothyroidism - Primary    Clinically euthyroid TSH done today Continue Synthroid 100 mcg daily Will adjust dose according to TSH level       Relevant Orders   Comprehensive metabolic panel   TSH   CBC with Differential/Platelet     Genitourinary   Yeast vaginitis    Recommend Diflucan 150 mg once      Relevant Medications   fluconazole (DIFLUCAN)  150 MG tablet     Other   Dyslipidemia    Eating better and losing weight. Wt Readings from Last 3 Encounters:  03/26/23 165 lb 8 oz (75.1 kg)  09/18/22 176 lb 2 oz (79.9 kg)  08/08/22 178 lb (80.7 kg)  Feeling better. Diet and nutrition discussed Benefits of exercise discussed Lipid profile done today Continue rosuvastatin 20 mg daily        Relevant Orders   Comprehensive metabolic panel   CBC with Differential/Platelet   Lipid panel   Hemoglobin A1c   Patient Instructions  Hipotiroidismo Hypothyroidism  El hipotiroidismo ocurre cuando la glndula tiroidea no produce la cantidad suficiente de ciertas hormonas. Esto se denomina tiroides hipoactiva. La glndula tiroidea es una pequea glndula ubicada en la parte delantera inferior del cuello, justo delante de la trquea. Esta glndula produce hormonas que ayudan a Scientist, physiological forma en la que el cuerpo [place] los alimentos para obtener energa (metabolismo) as como tambin la funcin cardaca y la funcin cerebral. Estas hormonas tambin juegan un papel para [employer] huesos fuertes. Cuando la tiroides es hipoactiva, produce muy poca cantidad de las hormonas tiroxina (T4) y triyodotironina (T3). Cules son las causas? Esta afeccin puede ser causada por lo siguiente: Enfermedad de Hashimoto. Se trata de una enfermedad por la cual el sistema del cuerpo encargado de combatir las enfermedades (sistema inmunitario) ataca la glndula tiroidea. Esta es la causa ms frecuente. Infecciones  virales. Embarazo. Ciertos medicamentos. Defectos congnitos. Problemas con Neomia Dear glndula ubicada en el centro del cerebro (hipfisis). Falta de una cantidad suficiente de yodo en la dieta. Algunas otras causas son las siguientes: Radioterapias anteriores en la cabeza o el cuello para Management consultant. Tratamiento previo con yodo radioactivo. Exposicin a la radiacin en el ambiente, en el pasado. Extirpacin quirrgica previa de una parte o de toda la tiroides. Qu incrementa el riesgo? Es ms probable que sufra esta afeccin si: Es mujer. Tiene antecedentes familiares de afecciones tiroideas. [place] un medicamento denominado litio. Toma medicamentos que afectan el sistema inmunitario (inmunosupresores). Cules son los signos o sntomas? Los sntomas frecuentes de esta afeccin incluyen los siguientes: Incapacidad para tolerar el fro. Sensacin de falta de energa (letargo). Falta de apetito. Estreimiento. Tristeza o depresin. Aumento de peso que no puede explicarse por un cambio en la dieta o en los hbitos de ejercicio fsico. Irregularidades menstruales. Piel seca, cabello grueso o uas quebradizas. Otros sntomas pueden incluir: Dolor muscular. Ralentizacin de los procesos de pensamiento. Mala memoria. Cmo se diagnostica? Esta afeccin se puede diagnosticar en funcin de lo siguiente: Los sntomas, los antecedentes mdicos y un examen fsico. Anlisis de Retail buyer. Tambin puede someterse a estudios por imgenes como una ecografa o una resonancia magntica (RM). Cmo se trata? Esta afeccin se trata con medicamentos que reemplazan las hormonas tiroideas que el cuerpo no produce. Despus de Microbiologist, pueden pasar varias semanas hasta la desaparicin de los sntomas. Siga estas indicaciones en su casa: Use los medicamentos de venta libre y los recetados solamente como se lo haya indicado el mdico. Si empieza a tomar medicamentos nuevos, infrmele al  mdico. Oceanographer a todas las visitas de seguimiento como se lo haya indicado el mdico. Esto es importante. A medida que la afeccin mejora, es posible que haya que modificar las dosis de los medicamentos de hormona tiroidea. Tendr que hacerse anlisis de sangre peridicamente, de modo que el mdico pueda Passenger transport manager. Comunquese con un mdico si: Los sntomas no mejoran con Scientist, research (medical). Est  tomando medicamentos de reemplazo de la hormona tiroidea y: Wendall Stade. Tiene temblores. Se siente ansioso. [place] de peso rpidamente. No puede Patent examiner. Tiene cambios emocionales. Tiene diarrea. Se siente dbil. Solicite ayuda de inmediato si: Midwife. Tiene latidos cardacos irregulares. Tiene latidos cardacos acelerados. Tiene dificultad para respirar. Estos sntomas pueden Customer service manager. Solicite ayuda de inmediato. Llame al 911. No espere a ver si los sntomas desaparecen. No conduzca por sus propios medios [employer]. Resumen El hipotiroidismo ocurre cuando la glndula tiroidea no produce la cantidad suficiente de ciertas hormonas (es hipoactiva). Cuando la tiroides es hipoactiva, produce muy poca cantidad de las hormonas tiroxina (T4) y triyodotironina (T3). La causa ms frecuente es la enfermedad de Hashimoto, una enfermedad por la cual el sistema del cuerpo encargado de combatir las enfermedades (sistema inmunitario) ataca la glndula tiroidea. La afeccin tambin puede ser consecuencia de infecciones virales, medicamentos, el embarazo o luego de un tratamiento de radioterapia en la cabeza o el cuello. Los sntomas pueden incluir aumento de [place], piel seca, estreimiento, sensacin de no tener energa e incapacidad para [employer] fro. Esta afeccin se trata con medicamentos que reemplazan las hormonas tiroideas que el cuerpo no produce. Esta informacin no tiene Theme park manager el consejo del mdico. Asegrese de hacerle al mdico  cualquier pregunta que tenga. Document Revised: 09/27/2021 Document Reviewed: 09/27/2021 Elsevier Patient Education  2024 [employer] Inc.      Edwina Barth, MD [facility]

## 2023-03-26 NOTE — Assessment & Plan Note (Signed)
Recommend Diflucan 150 mg once

## 2023-03-26 NOTE — Assessment & Plan Note (Addendum)
Clinically euthyroid TSH done today Continue Synthroid 100 mcg daily Will adjust dose according to TSH level

## 2023-03-26 NOTE — Assessment & Plan Note (Signed)
Eating better and losing weight. Wt Readings from Last 3 Encounters:  03/26/23 165 lb 8 oz (75.1 kg)  09/18/22 176 lb 2 oz (79.9 kg)  08/08/22 178 lb (80.7 kg)  Feeling better. Diet and nutrition discussed Benefits of exercise discussed Lipid profile done today Continue rosuvastatin 20 mg daily

## 2023-04-04 ENCOUNTER — Other Ambulatory Visit: Payer: Self-pay | Admitting: *Deleted

## 2023-04-04 ENCOUNTER — Other Ambulatory Visit: Payer: Self-pay | Admitting: Emergency Medicine

## 2023-04-04 DIAGNOSIS — E039 Hypothyroidism, unspecified: Secondary | ICD-10-CM

## 2023-04-04 MED ORDER — LEVOTHYROXINE SODIUM 100 MCG PO TABS
100.0000 ug | ORAL_TABLET | Freq: Every day | ORAL | 3 refills | Status: DC
Start: 2023-04-04 — End: 2024-03-31

## 2023-06-09 ENCOUNTER — Other Ambulatory Visit: Payer: Self-pay | Admitting: Emergency Medicine

## 2023-06-09 DIAGNOSIS — Z1231 Encounter for screening mammogram for malignant neoplasm of breast: Secondary | ICD-10-CM

## 2023-07-14 ENCOUNTER — Ambulatory Visit
Admission: RE | Admit: 2023-07-14 | Discharge: 2023-07-14 | Disposition: A | Discharge status: Home / Self Care | Payer: BC Managed Care – PPO | Source: Ambulatory Visit | Attending: Emergency Medicine

## 2023-07-14 DIAGNOSIS — Z1231 Encounter for screening mammogram for malignant neoplasm of breast: Secondary | ICD-10-CM | POA: Diagnosis not present

## 2023-07-17 ENCOUNTER — Other Ambulatory Visit: Payer: Self-pay | Admitting: Emergency Medicine

## 2023-07-17 DIAGNOSIS — R928 Other abnormal and inconclusive findings on diagnostic imaging of breast: Secondary | ICD-10-CM

## 2023-07-28 ENCOUNTER — Ambulatory Visit
Admission: RE | Admit: 2023-07-28 | Discharge: 2023-07-28 | Disposition: A | Discharge status: Home / Self Care | Payer: BC Managed Care – PPO | Source: Ambulatory Visit | Attending: Emergency Medicine | Admitting: Emergency Medicine

## 2023-07-28 ENCOUNTER — Ambulatory Visit
Admission: RE | Admit: 2023-07-28 | Discharge: 2023-07-28 | Disposition: A | Discharge status: Home / Self Care | Payer: BC Managed Care – PPO | Source: Ambulatory Visit | Attending: Emergency Medicine

## 2023-07-28 DIAGNOSIS — R928 Other abnormal and inconclusive findings on diagnostic imaging of breast: Secondary | ICD-10-CM

## 2023-07-29 ENCOUNTER — Encounter: Payer: Self-pay | Admitting: Emergency Medicine

## 2023-07-29 ENCOUNTER — Ambulatory Visit (INDEPENDENT_AMBULATORY_CARE_PROVIDER_SITE_OTHER): Payer: BC Managed Care – PPO | Admitting: Emergency Medicine

## 2023-07-29 VITALS — BP 122/82 | HR 60 | Temp 98.1°F | Ht 64.0 in | Wt 157.4 lb

## 2023-07-29 DIAGNOSIS — M7918 Myalgia, other site: Secondary | ICD-10-CM | POA: Diagnosis not present

## 2023-07-29 DIAGNOSIS — S46819A Strain of other muscles, fascia and tendons at shoulder and upper arm level, unspecified arm, initial encounter: Secondary | ICD-10-CM | POA: Diagnosis not present

## 2023-07-29 DIAGNOSIS — R110 Nausea: Secondary | ICD-10-CM | POA: Insufficient documentation

## 2023-07-29 DIAGNOSIS — E039 Hypothyroidism, unspecified: Secondary | ICD-10-CM | POA: Diagnosis not present

## 2023-07-29 DIAGNOSIS — E785 Hyperlipidemia, unspecified: Secondary | ICD-10-CM

## 2023-07-29 MED ORDER — CYCLOBENZAPRINE HCL 10 MG PO TABS: 10.0000 mg | ORAL_TABLET | Freq: Every day | ORAL | 1 refills | Status: DC

## 2023-07-29 NOTE — Progress Notes (Signed)
Kelli Hale 52 y.o.   Chief Complaint  Patient presents with   Neck Pain    Patient c/o back neck pain and both her arms hurt down to her elbow for about 2 weeks. Also having nausea and dizziness on and off.     HISTORY OF PRESENT ILLNESS: This is a 52 y.o. female complaining of pain to upper back and neck with occasional radiation to arms on and off for the past 2 weeks. Physical work cleaning houses. Also has occasional nausea and dizziness.  Better today. History of hypothyroidism with normal latest TSH.  On Synthroid.  No changes History of dyslipidemia on rosuvastatin.  Normal last lipid profile No other complaints or medical concerns today.  Neck Pain  Pertinent negatives include no chest pain, fever or headaches.     Prior to Admission medications   Medication Sig Start Date End Date Taking? Authorizing Provider  cyclobenzaprine (FLEXERIL) 10 MG tablet Take 1 tablet (10 mg total) by mouth at bedtime. 07/29/23  Yes Georgina Quint, MD  levothyroxine (SYNTHROID) 100 MCG tablet Take 1 tablet (100 mcg total) by mouth daily before breakfast. 04/04/23  Yes Leven Hoel, Eilleen Kempf, MD  meloxicam (MOBIC) 15 MG tablet Take 1 tablet (15 mg total) by mouth daily. Take only as needed. 07/19/22  Yes Elenore Paddy, NP  methocarbamol (ROBAXIN) 500 MG tablet Take 1 tablet (500 mg total) by mouth 2 (two) times daily as needed for muscle spasms. 07/19/22  Yes Elenore Paddy, NP  rosuvastatin (CRESTOR) 20 MG tablet Take 1 tablet (20 mg total) by mouth daily. 09/18/22  Yes Aboubacar Matsuo, Eilleen Kempf, MD  Calcium Carbonate (CALCIUM 600 PO) Take by mouth daily. Patient not taking: Reported on 07/29/2023    [provider]  VITAMIN D PO Take by mouth daily. Patient not taking: Reported on 07/29/2023    [provider]    No Known Allergies  Patient Active Problem List   Diagnosis Date Noted   Trapezius strain 07/29/2023   Musculoskeletal pain 07/29/2023   Nausea  07/29/2023   History of colonic polyps 09/18/2022   Dyslipidemia 08/18/2019   Hypothyroidism 06/28/2018    Past Medical History:  Diagnosis Date   Allergy    Anemia    Constipation    occasionally- but daily or weekly    Elevated cholesterol    Fibroid    Hypothyroidism 2019   Hypothyroidism 2019   Thyroid disease 2005    Past Surgical History:  Procedure Laterality Date   CESAREAN SECTION     x 1   COLPOSCOPY     DILATATION & CURETTAGE/HYSTEROSCOPY WITH MYOSURE N/A 07/28/2018   Procedure: DILATATION & CURETTAGE/HYSTEROSCOPY WITH MYOSURE;  Surgeon: Patton Salles, MD;  Location: [facility];  Service: Gynecology;  Laterality: N/A;  rep will be here confirmed on 07/27/18   ROBOTIC ASSISTED TOTAL HYSTERECTOMY N/A 09/30/2019   Procedure: XI ROBOTIC ASSISTED TOTAL HYSTERECTOMY GREATER THAN 250 GRAMS;  Surgeon: Adolphus Birchwood, MD;  Location: [facility];  Service: Gynecology;  Laterality: N/A;   TUBAL LIGATION     UPPER GASTROINTESTINAL ENDOSCOPY  01/16/2018   XI ROBOTIC ASSISTED SALPINGECTOMY N/A 09/30/2019   Procedure: XI ROBOTIC ASSISTED SALPINGECTOMY;  Surgeon: Adolphus Birchwood, MD;  Location: [facility];  Service: Gynecology;  Laterality: N/A;    Social History   Socioeconomic History   Marital status: Married    Spouse name: Not on file   Number of children: 3   Years of education: Not  on file   Highest education level: Not on file  Occupational History   Not on file  Tobacco Use   Smoking status: Never   Smokeless tobacco: Never  Vaping Use   Vaping status: Never Used  Substance and Sexual Activity   Alcohol use: No   Drug use: No   Sexual activity: Yes    Birth control/protection: Surgical    Comment: Hyst  Other Topics Concern   Not on file  Social History Narrative   Not on file   Social Determinants of Health   Financial Resource Strain: Not on file  Food Insecurity: Not on file  Transportation Needs: Not on file  Physical Activity: Not on file  Stress: Not  on file  Social Connections: Not on file  Intimate Partner Violence: Not on file    Family History  Problem Relation Age of Onset   Heart disease Mother    Rectal cancer Neg Hx    Stomach cancer Neg Hx    Colon cancer Neg Hx    Esophageal cancer Neg Hx    Colon polyps Neg Hx    Breast cancer Neg Hx      Review of Systems  Constitutional: Negative.  Negative for fever.  HENT: Negative.  Negative for congestion and sore throat.   Respiratory: Negative.  Negative for cough and shortness of breath.   Cardiovascular: Negative.  Negative for chest pain and palpitations.  Gastrointestinal:  Positive for nausea. Negative for abdominal pain, diarrhea and vomiting.  Genitourinary: Negative.  Negative for dysuria and hematuria.  Musculoskeletal:  Positive for back pain and neck pain.  Skin: Negative.  Negative for rash.  Neurological: Negative.  Negative for dizziness and headaches.  All other systems reviewed and are negative.   Vitals:   07/29/23 0810  BP: 122/82  Pulse: 60  Temp: 98.1 F (36.7 C)  SpO2: 96%    Physical Exam Vitals reviewed.  Constitutional:      Appearance: Normal appearance.  HENT:     Head: Normocephalic.     Mouth/Throat:     Mouth: Mucous membranes are moist.     Pharynx: Oropharynx is clear.  Eyes:     Extraocular Movements: Extraocular movements intact.     Conjunctiva/sclera: Conjunctivae normal.     Pupils: Pupils are equal, round, and reactive to light.  Cardiovascular:     Rate and Rhythm: Normal rate and regular rhythm.     Pulses: Normal pulses.     Heart sounds: Normal heart sounds.  Pulmonary:     Effort: Pulmonary effort is normal.     Breath sounds: Normal breath sounds.  Abdominal:     Palpations: Abdomen is soft.     Tenderness: There is no abdominal tenderness.  Musculoskeletal:     Cervical back: No tenderness.     Right lower leg: No edema.     Left lower leg: No edema.  Lymphadenopathy:     Cervical: No cervical  adenopathy.  Skin:    General: Skin is warm and dry.     Capillary Refill: Capillary refill takes less than 2 seconds.  Neurological:     General: No focal deficit present.     Mental Status: She is alert and oriented to person, place, and time.  Psychiatric:        Mood and Affect: Mood normal.        Behavior: Behavior normal.      ASSESSMENT & PLAN: A total of 42 minutes was  spent with the patient and counseling/coordination of care regarding preparing for this visit, review of most recent office visit notes, review of multiple chronic medical conditions under management, review of most recent blood work results, review of all medications, diagnosis of musculoskeletal strain and pain, pain management, prognosis, documentation, and need for follow-up.  Problem List Items Addressed This Visit       Endocrine   Hypothyroidism    Clinically euthyroid Lab Results  Component Value Date   TSH 1.69 03/26/2023  Continue Synthroid 100 mcg daily          Musculoskeletal and Integument   Trapezius strain - Primary    Related to activities of daily living and physical work No red flag signs or symptoms Recommend Flexeril 10 mg at bedtime Heat pad after work      Relevant Medications   cyclobenzaprine (FLEXERIL) 10 MG tablet     Other   Dyslipidemia    Wt Readings from Last 3 Encounters:  07/29/23 157 lb 6.4 oz (71.4 kg)  03/26/23 165 lb 8 oz (75.1 kg)  09/18/22 176 lb 2 oz (79.9 kg)  Eating better and losing weight.  Feels better. Diet and nutrition discussed Normal last lipid profile July 2024 Continue rosuvastatin 20 mg daily       Musculoskeletal pain    Pain management discussed May take Tylenol and or Advil during the day and Flexeril at bedtime Related to activities of daily living      Relevant Medications   cyclobenzaprine (FLEXERIL) 10 MG tablet   Nausea    Intermittent and not present today. Unknown etiology No red flag signs or symptoms.  No  concerns. States she started sleeping better and symptom is gone.      Patient Instructions  Mantenimiento de Radiographer, therapeutic en las mujeres Health Maintenance, Female Adoptar un estilo de vida saludable y recibir atencin preventiva son importantes para promover la salud y Counsellor. Consulte al mdico sobre: El esquema adecuado para hacerse pruebas y exmenes peridicos. Cosas que puede hacer por su cuenta para prevenir enfermedades y [place] sano. Qu debo saber sobre la dieta, el peso y el ejercicio? Consuma una dieta saludable  Consuma una dieta que incluya muchas verduras, frutas, productos lcteos con bajo contenido de [place] y Associate Professor. No consuma muchos alimentos ricos en grasas slidas, azcares agregados o sodio. Mantenga un peso saludable El ndice de masa muscular [facility]) se [place] para identificar problemas de [place]. Proporciona una estimacin de la grasa corporal basndose en el peso y la altura. Su mdico puede ayudarle a Engineer, site IMC y a Personnel officer o Pharmacologist un peso saludable. Haga ejercicio con regularidad Haga ejercicio con regularidad. Esta es una de las prcticas ms importantes que puede hacer por su salud. La [employer] de los adultos deben seguir estas pautas: Education officer, environmental, al menos, 150 minutos de actividad fsica por semana. El ejercicio debe aumentar la frecuencia cardaca y Media planner transpirar (ejercicio de intensidad moderada). Hacer ejercicios de fortalecimiento por lo [employer] por semana. Agregue esto a su plan de ejercicio de intensidad moderada. Pase menos tiempo sentada. Incluso la actividad fsica ligera puede ser beneficiosa. Controle sus niveles de colesterol y lpidos en la sangre Comience a realizarse anlisis de lpidos y Oncologist en la sangre a los 20 aos y luego reptalos cada 5 aos. Hgase controlar los niveles de colesterol con mayor frecuencia si: Sus niveles de lpidos y colesterol son altos. Es mayor de 40 aos. Presenta un alto  riesgo de  padecer enfermedades cardacas. Qu debo saber sobre las pruebas de deteccin del cncer? Segn su historia clnica y sus antecedentes familiares, es posible que deba realizarse pruebas de deteccin del cncer en diferentes edades. Esto puede incluir pruebas de deteccin de lo siguiente: Cncer de mama. Cncer de cuello uterino. Cncer colorrectal. Cncer de piel. Cncer de pulmn. Qu debo saber sobre la enfermedad cardaca, la diabetes y la hipertensin arterial? Presin arterial y enfermedad cardaca La hipertensin arterial causa enfermedades cardacas y [place] el riesgo de accidente cerebrovascular. Es ms probable que esto se manifieste en las personas que tienen lecturas de presin arterial alta o tienen sobrepeso. Hgase controlar la presin arterial: Cada 3 a 5 aos si tiene entre 18 y 49 aos. Todos los aos si es mayor de 40 aos. Diabetes Realcese exmenes de deteccin de la diabetes con regularidad. Este anlisis revisa el nivel de azcar en la sangre en [place]. Hgase las pruebas de deteccin: Cada tres aos despus de los 40 aos de edad si tiene un peso normal y un bajo riesgo de padecer diabetes. Con ms frecuencia y a partir de [place] edad inferior si tiene sobrepeso o un alto riesgo de padecer diabetes. Qu debo saber sobre la prevencin de infecciones? Hepatitis B Si tiene un riesgo ms alto de contraer hepatitis B, debe someterse a un examen de deteccin de este virus. Hable con el mdico para averiguar si tiene riesgo de contraer la infeccin por hepatitis B. Hepatitis C Se recomienda el anlisis a: [employer] 1945 y 1965. Todas las personas que tengan un riesgo de haber contrado hepatitis C. Enfermedades de transmisin sexual (ETS) Hgase las pruebas de Airline pilot de ITS, incluidas la gonorrea y la clamidia, si: Es sexualmente activa y es menor de [address]. Es mayor de [address], y Public affairs consultant informa que corre riesgo de tener este tipo  de infecciones. La actividad sexual ha cambiado desde que le hicieron la ltima prueba de deteccin y tiene un riesgo mayor de Warehouse manager clamidia o Copy. Pregntele al mdico si usted tiene riesgo. Pregntele al mdico si usted tiene un alto riesgo de Primary school teacher VIH. El mdico tambin puede recomendarle un medicamento recetado para ayudar a evitar la infeccin por el VIH. Si elige tomar medicamentos para prevenir el VIH, primero debe [employer] de deteccin del VIH. Luego debe hacerse anlisis cada 3 meses mientras est tomando los medicamentos. Embarazo Si est por dejar de Armed forces training and education officer (fase premenopusica) y usted puede quedar [place], busque asesoramiento antes de [place]. [place] de 400 a 800 microgramos (mcg) de cido [employer] si [place]. Pida mtodos de control de la natalidad (anticonceptivos) si desea evitar un embarazo no deseado. Osteoporosis y [place] La osteoporosis es una enfermedad en la que los huesos pierden los minerales y la fuerza por el avance de la edad. El resultado pueden ser fracturas en los [place]. Si tiene 65 aos o ms, o si est en riesgo de sufrir osteoporosis y fracturas, pregunte a su mdico si debe: Hacerse pruebas de deteccin de prdida sea. Tomar un suplemento de calcio o de vitamina D para reducir el riesgo de fracturas. Recibir terapia de reemplazo hormonal (TRH) para tratar los sntomas de la menopausia. Siga estas indicaciones en su casa: Consumo de alcohol No beba alcohol si: Su mdico le indica no hacerlo. Est embarazada, puede estar embarazada o est tratando de [place]. Si bebe alcohol: Limite la cantidad que bebe a lo siguiente: De 0 a 1  bebida por da. Sepa cunta cantidad de alcohol hay en las bebidas que toma. En los [address], una medida equivale a una botella de cerveza de 12 oz (355 ml), un vaso de vino de 5 oz (148 ml) o un vaso de una bebida alcohlica de alta graduacin de 1 oz (44  ml). Estilo de vida No consuma ningn producto que contenga nicotina o tabaco. Estos productos incluyen cigarrillos, tabaco para Theatre manager y aparatos de vapeo, como los Administrator, Civil Service. Si necesita ayuda para dejar de consumir estos productos, consulte al mdico. No consuma drogas. No comparta agujas. Solicite ayuda a su mdico si necesita apoyo o informacin para abandonar las drogas. Indicaciones generales Realcese los estudios de rutina de [address], dentales y de Wellsite geologist. Mantngase al da con las vacunas. Infrmele a su mdico si: Se siente deprimida con frecuencia. Alguna vez ha sido vctima de [place] o no se siente seguro en su casa. Resumen Adoptar un estilo de vida saludable y recibir atencin preventiva son importantes para promover la salud y Counsellor. Siga las instrucciones del mdico acerca de una dieta saludable, el ejercicio y la realizacin de pruebas o exmenes para Hotel manager. Siga las instrucciones del mdico con respecto al control del colesterol y la presin arterial. Esta informacin no tiene Theme park manager el consejo del mdico. Asegrese de hacerle al mdico cualquier pregunta que tenga. Document Revised: 02/01/2021 Document Reviewed: 02/01/2021 Elsevier Patient Education  2024 Elsevier Inc.     Edwina Barth, MD [facility]

## 2023-07-29 NOTE — Patient Instructions (Signed)

## 2023-07-29 NOTE — Assessment & Plan Note (Signed)
Related to activities of daily living and physical work No red flag signs or symptoms Recommend Flexeril 10 mg at bedtime Heat pad after work

## 2023-07-29 NOTE — Assessment & Plan Note (Signed)
Wt Readings from Last 3 Encounters:  07/29/23 157 lb 6.4 oz (71.4 kg)  03/26/23 165 lb 8 oz (75.1 kg)  09/18/22 176 lb 2 oz (79.9 kg)  Eating better and losing weight.  Feels better. Diet and nutrition discussed Normal last lipid profile July 2024 Continue rosuvastatin 20 mg daily

## 2023-07-29 NOTE — Assessment & Plan Note (Signed)
Intermittent and not present today. Unknown etiology No red flag signs or symptoms.  No concerns. States she started sleeping better and symptom is gone.

## 2023-07-29 NOTE — Assessment & Plan Note (Signed)
Pain management discussed May take Tylenol and or Advil during the day and Flexeril at bedtime Related to activities of daily living

## 2023-07-29 NOTE — Assessment & Plan Note (Signed)
Clinically euthyroid Lab Results  Component Value Date   TSH 1.69 03/26/2023  Continue Synthroid 100 mcg daily

## 2023-09-16 NOTE — Progress Notes (Signed)
 53 y.o. [id number] Married  female here for annual exam.    Spanish interpretor is present for the entire visit today.  Not sleeping well every night.  Wakes up at night in the middle of the night. No hot flashes.  No life change or life stress.  Not drinking coffee at night.  May drink a soda at times.   Her PCP prescribed medication for helping her to sleep and to treat neck pain.  Flexeril  makes her really tired.   She will see her PCP next week regarding this.   Denies depression or anxiety.  Some pain with intercourse.  Not using lubricants.   PCP: Elvira Hammersmith, MD   Patient's last menstrual period was 09/24/2019 (exact date).           Sexually active: Yes.    The current method of family planning is status post hysterectomy.    Menopausal hormone therapy:  n/a Exercising: Yes. Walking Smoker:  no  OB History  Gravida Para Term Preterm AB Living  3 2 2  0 0 3  SAB IAB Ectopic Multiple Live Births  0 0 0 0 3    # Outcome Date GA Lbr Len/2nd Weight Sex Type Anes PTL Lv  3 Gravida 2006    F CS-Classical   LIV  2 Term 30    M Vag-Spont   LIV  1 Term 43    F Vag-Spont   LIV     HEALTH MAINTENANCE: Last 2 paps:  05/27/18 Neg:Neg HR HPV  History of abnormal Pap or positive HPV:  no Mammogram:   07/28/23 Breast Density Cat C, BI-RADS CAT 2 benign Colonoscopy:  04/30/18 - due for colonoscopy 2026 Bone Density:  n/a  Result  n/a   Immunization History  Administered Date(s) Administered   Influenza Inj Mdck Quad Pf 06/30/2018   Influenza,inj,Quad PF,6+ Mos 08/24/2020, 09/06/2021, 09/18/2022   Tdap 01/02/2018   Zoster Recombinant(Shingrix ) 03/18/2022, 09/18/2022      reports that she has never smoked. She has never used smokeless tobacco. She reports that she does not drink alcohol and does not use drugs.  Past Medical History:  Diagnosis Date   Allergy    Anemia    Constipation    occasionally- but daily or weekly    Elevated cholesterol     Fibroid    Hypothyroidism 2019   Hypothyroidism 2019   Thyroid  disease 2005    Past Surgical History:  Procedure Laterality Date   CESAREAN SECTION     x 1   COLPOSCOPY     DILATATION & CURETTAGE/HYSTEROSCOPY WITH MYOSURE N/A 07/28/2018   Procedure: DILATATION & CURETTAGE/HYSTEROSCOPY WITH MYOSURE;  Surgeon: Greta Leatherwood, MD;  Location: [facility];  Service: Gynecology;  Laterality: N/A;  rep will be here confirmed on 07/27/18   ROBOTIC ASSISTED TOTAL HYSTERECTOMY N/A 09/30/2019   Procedure: XI ROBOTIC ASSISTED TOTAL HYSTERECTOMY GREATER THAN 250 GRAMS;  Surgeon: Alphonso Aschoff, MD;  Location: [facility];  Service: Gynecology;  Laterality: N/A;   TUBAL LIGATION     UPPER GASTROINTESTINAL ENDOSCOPY  01/16/2018   XI ROBOTIC ASSISTED SALPINGECTOMY N/A 09/30/2019   Procedure: XI ROBOTIC ASSISTED SALPINGECTOMY;  Surgeon: Alphonso Aschoff, MD;  Location: [facility];  Service: Gynecology;  Laterality: N/A;    Current Outpatient Medications  Medication Sig Dispense Refill   Calcium  Carbonate (CALCIUM  600 PO) Take by mouth daily.     cyclobenzaprine  (FLEXERIL ) 10 MG tablet Take 1 tablet (10 mg total) by mouth  at bedtime. 30 tablet 1   levothyroxine  (SYNTHROID ) 100 MCG tablet Take 1 tablet (100 mcg total) by mouth daily before breakfast. 90 tablet 3   meloxicam  (MOBIC ) 15 MG tablet Take 1 tablet (15 mg total) by mouth daily. Take only as needed. 30 tablet 0   methocarbamol  (ROBAXIN ) 500 MG tablet Take 1 tablet (500 mg total) by mouth 2 (two) times daily as needed for muscle spasms. 20 tablet 0   rosuvastatin  (CRESTOR ) 20 MG tablet Take 1 tablet (20 mg total) by mouth daily. 90 tablet 3   VITAMIN D PO Take by mouth daily.     No current facility-administered medications for this visit.    ALLERGIES: Patient has no known allergies.  Family History  Problem Relation Age of Onset   Heart disease Mother    Rectal cancer Neg Hx    Stomach cancer Neg Hx    Colon cancer Neg Hx    Esophageal cancer Neg  Hx    Colon polyps Neg Hx    Breast cancer Neg Hx     Review of Systems  All other systems reviewed and are negative.   PHYSICAL EXAM:  BP 132/80 (BP Location: Left Arm, Patient Position: Sitting, Cuff Size: Small)   Pulse 67   Ht 5' 5.25" (1.657 m)   Wt 157 lb (71.2 kg)   LMP 09/24/2019 (Exact Date)   SpO2 99%   BMI 25.93 kg/m     General appearance: alert, cooperative and appears stated age Head: normocephalic, without obvious abnormality, atraumatic Neck: no adenopathy, supple, symmetrical, trachea midline and thyroid  normal to inspection and palpation Lungs: clear to auscultation bilaterally Breasts: normal appearance, no masses or tenderness, No nipple retraction or dimpling, No nipple discharge or bleeding, No axillary adenopathy Heart: regular rate and rhythm Abdomen: soft, non-tender; no masses, no organomegaly Extremities: extremities normal, atraumatic, no cyanosis or edema Skin: skin color, texture, turgor normal. No rashes or lesions Lymph nodes: cervical, supraclavicular, and axillary nodes normal. Neurologic: grossly normal  Pelvic: External genitalia:  no lesions              No abnormal inguinal nodes palpated.              Urethra:  normal appearing urethra with no masses, tenderness or lesions              Bartholins and Skenes: normal                 Vagina: normal appearing vagina with normal color and discharge, no lesions              Cervix: absent              Pap taken: No. Bimanual Exam:  Uterus:  absent.              Adnexa: no mass, fullness, tenderness              Rectal exam: Yes.  .  Confirms.              Anus:  normal sphincter tone, no lesions  Chaperone was present for exam:  Emmaline Haring, CMA  ASSESSMENT: Well woman visit with gynecologic exam Status post robotically assisted hysterectomy with bilateral salpingectomy.  Ovaries remain.  Vaginal atrophy. Hypothyroidism.  Managed by her PCP.  PHQ 9 score:  5. Sleep disturbance.    PLAN: Mammogram screening discussed. Self breast awareness reviewed. Pap and HRV collected:  No. Guidelines for Calcium , Vitamin D,  regular exercise program including cardiovascular and weight bearing exercise. Medication refills:  NA We discussed water  based lubricants, cooking oils, and prescription vaginal estrogen to treat atrophy. No Rx given today.  Consider melatonin to help with sleep.  She will follow up with her PCP. Follow up here:  yearly and prn.

## 2023-09-24 ENCOUNTER — Encounter: Payer: Self-pay | Admitting: Obstetrics and Gynecology

## 2023-09-24 ENCOUNTER — Ambulatory Visit: Payer: BC Managed Care – PPO | Admitting: Obstetrics and Gynecology

## 2023-09-24 VITALS — BP 132/80 | HR 67 | Ht 65.25 in | Wt 157.0 lb

## 2023-09-24 DIAGNOSIS — Z01419 Encounter for gynecological examination (general) (routine) without abnormal findings: Secondary | ICD-10-CM | POA: Diagnosis not present

## 2023-09-24 DIAGNOSIS — Z1331 Encounter for screening for depression: Secondary | ICD-10-CM

## 2023-09-24 NOTE — Patient Instructions (Addendum)
 Vaginitis atrfica Atrophic Vaginitis  La vaginitis atrfica se produce cuando el recubrimiento de la vagina se seca y afina. Esto es ms frecuente en las mujeres que ya no tienen el perodo menstrual (que se encuentran en la menopausia). Suele comenzar cuando la mujer tiene entre 45 y 43 aos. Cules son las causas? La causa de esta afeccin es una cada en la produccin de una hormona femenina (estrgeno) causa esta afeccin. Qu incrementa el riesgo? Es ms probable que sufra esta afeccin si: Toma ciertos medicamentos. Le han extirpado los ovarios. Recibe tratamiento para el cncer. Ha dado a luz o est amamantando. Tiene ms de 50 aos. Fuma. Cules son los signos o sntomas? Dolor al [employer]. Sensacin de presin [employer]. Sangrado [employer]. Ardor o picazn adentro de la vagina. Dolor urente al hacer pis (orinar). Secrecin de un lquido de la vagina. Algunas personas no tienen sntomas. Cmo se trata? Uso de un lubricante antes de las [employer]. Uso de un hidratante en la vagina. Uso de estrgenos en la vagina. En algunos casos, puede ser que no se necesite tratamiento. Siga estas instrucciones en su casa: Medicamentos Use todos los medicamentos solamente como se lo haya indicado el mdico. Esto incluye medicamentos para la sequedad. No use ningn medicamento a base de hierbas, excepto que el mdico lo apruebe. Instrucciones generales Hable con su mdico acerca del tratamiento. No se haga duchas vaginales. No utilice los siguientes productos con fragancia: Aerosoles. Tampones. Jabones. Si siente dolor al [employer], pruebe usar lubricantes justo antes. Comunquese con un mdico si: Le sale un lquido que no es como el normal por la vagina. Le sale mal olor de la vagina. Aparecen nuevos sntomas. Los sntomas no mejoran cuando se los trata. Sus sntomas empeoran. Resumen Esta afeccin se produce cuando  el recubrimiento de la vagina se seca y afina. Es ms frecuente en mujeres que ya no tienen el perodo menstrual. El tratamiento puede incluir el uso de medicamentos para la sequedad. Llame a un mdico si sus sntomas no mejoran. Esta informacin no tiene Theme park manager el consejo del mdico. Asegrese de hacerle al mdico cualquier pregunta que tenga. Document Revised: 03/29/2020 Document Reviewed: 03/29/2020 Elsevier Patient Education  2024 Elsevier Inc.   Melatonin Capsules or Tablets Kelli Hale es [place] medicamento? La MELATONINA se promociona para trastornos del sueo, tales como insomnio o desfase horario. La melatonina ayuda a regular su ciclo del sueo. Este suplemento no est hecho para diagnosticar, tratar, curar o prevenir ninguna enfermedad. Este medicamento puede ser utilizado para otros usos; si tiene alguna pregunta consulte con su proveedor de atencin mdica o con su farmacutico. Qu le debo informar a mi profesional de la salud antes de tomar este medicamento? Necesitan saber si usted presenta alguno de los siguientes problemas o situaciones: Cncer Depresin o afeccin de salud mental Diabetes Consume bebidas alcohlicas con frecuencia Problemas hormonales Problemas del sistema inmunolgico Enfermedad heptica Enfermedad pulmonar o respiratoria, como asma Trasplante de rganos Trastorno convulsivo Una reaccin alrgica o inusual a la melatonina, a otros medicamentos, alimentos, colorantes o conservantes Si est embarazada o buscando quedar embarazada Si est amamantando a un beb Cmo debo [employer]? Tome este medicamento por va oral. No lo tome con alimentos. Este medicamento por lo general se administra 1 o 2 horas antes de acostarse. Despus de [employer], limite sus actividades a las necesarias para prepararse para acostarse. Algunos productos pueden masticarse o disolverse en la boca antes de  tragarlos. Algunas tabletas o  cpsulas deben tragarse enteras; no las corte, triture ni mastique. Siga las instrucciones de la etiqueta del envase o use segn las instrucciones de su equipo de atencin. No lo use con una frecuencia mayor a la indicada. Hable con su equipo de atencin sobre el uso de este medicamento en nios. Puede requerir atencin especial. No se recomienda el uso de este medicamento en nios sin una receta mdica. Sobredosis: Pngase en contacto inmediatamente con un centro toxicolgico o una sala de urgencia si usted cree que haya tomado demasiado medicamento.<br>ATENCIN: [employer] es solo para usted. No comparta este medicamento con nadie. Qu sucede si me olvido de una dosis? Si se olvida una dosis a la hora habitual, sltese la dosis olvidada. Si es casi la hora de la prxima dosis, tome slo esa dosis. No tome dosis adicionales o dobles. Qu puede interactuar con este medicamento? No use este medicamento con ninguno de los siguientes productos: Fluvoxamina Ramelten Tasimelten Este medicamento tambin podra interactuar con los siguientes productos: Alcohol Cafena Carbamazepina Ciertos antibiticos, como ciprofloxacino Ciertos medicamentos para la depresin, ansiedad o afecciones de salud mental Cimetidina Hormonas estrgeno o progestina Metoxaleno Nifedipino Otros suplementos a base de hierbas o dietticos Otros medicamentos que le ayudan a Financial trader sueo Fenobarbital Rifampicina Fumar tabaco Tamoxifeno Warfarina Puede ser que esta lista no menciona todas las posibles interacciones. Informe a su profesional de [employer] de [employer] productos a base de hierbas, medicamentos de Plover o suplementos nutritivos que est tomando. Si usted fuma, consume bebidas alcohlicas o si utiliza drogas ilegales, indqueselo tambin a su profesional de [employer]. Algunas sustancias pueden interactuar con su medicamento. A qu debo estar atento al usar [employer]? Si los sntomas no  mejoran o si empeoran, informe a su equipo de atencin. No administre este medicamento durante ms de 2 semanas a menos que se lo indique su equipo de atencin. Este medicamento podra afectar su coordinacin, tiempo de reaccin o juicio. No conduzca ni opere maquinaria pesada hasta que sepa cmo le afecta este medicamento. Pngase de pie o levntese lentamente para reducir el riesgo de mareos o Allen. Beber alcohol con [employer] puede aumentar el riesgo de estos efectos secundarios. Es posible que tenga conductas inusuales al dormir o realice actividades que no recuerde Medical laboratory scientific officer despus de usar este medicamento. Las actividades incluyen conducir, cocinar y comer, hablar por telfono, tener actividades sexuales o caminar dormido. Deje de usar [employer] y llame a su equipo de atencin de inmediato si descubre que ha hecho actividades [employer]. Hable con su equipo de atencin antes de usar este medicamento si actualmente est recibiendo tratamiento para un problema emocional, mental o de sueo. Este medicamento puede interferir con su tratamiento. Los suplementos dietticos o a base de hierbas no estn regulados como medicamentos. Los suplementos dietticos no requieren estndares de control de calidad rigurosos. La pureza y la potencia de estos productos pueden variar. No se conocen con exactitud la seguridad ni el efecto de este suplemento diettico para ciertas enfermedades. Este producto no est hecho para diagnosticar, tratar, curar o prevenir ninguna enfermedad. La Administracin de [employer] y [employer] sugiere lo siguiente para la proteccin de los consumidores: Siempre lea las etiquetas de los productos y siga las instrucciones. Natural no significa que un producto es seguro para el consumo humano. Busque productos que contengan ingredientes con la inscripcin "USP". Esto indica que el fabricante sigui las normas de la [employer]a de los [place]. Los suplementos  fabricados o  vendidos por compaas de alimentos o frmacos conocidas a nivel nacional tienen ms posibilidades de haber sido fabricados bajo un estricto control. Puede escribir a la compaa para obtener ms informacin sobre cmo se Engineer, materials. Qu efectos secundarios puedo tener al [employer] este medicamento? Efectos secundarios que debe informar a su equipo de atencin tan pronto como sea posible: Reacciones alrgicas: erupcin cutnea, comezn/picazn, urticaria, hinchazn de la cara, los labios, la lengua o la garganta Cambios en el estado de nimo y el comportamiento: ansiedad, nerviosismo, confusin, alucinaciones, irritabilidad, hostilidad, ideas suicidas o de autolesionarse, empeoramiento del [place] de nimo, sentimientos de depresin Efectos secundarios que generalmente no requieren atencin mdica (debe informarlos a su equipo de atencin si persisten o si son molestos): Nios que mojan la cama Mareos Somnolencia el da despus del uso Dolor de cabeza Nuseas Puede ser que esta lista no menciona todos los posibles efectos secundarios. Comunquese a su mdico por asesoramiento mdico [employer]. Usted puede informar los efectos secundarios a la FDA por telfono al 1-800-FDA-1088. Dnde debo guardar mi medicina? Mantenga fuera del alcance de los nios. Guarde a Marketing executive ambiente o como indique la etiqueta del envase. Proteja de la humedad. Deseche todo el medicamento que no haya utilizado despus de la fecha de vencimiento. <b>ATENCIN: Este folleto es un resumen. Puede ser que no cubra toda la posible informacin. Si usted tiene preguntas acerca de esta medicina, consulte con su mdico, su farmacutico o su profesional de Radiographer, therapeutic.</b>  2024 Elsevier/Gold Standard (2022-10-01 00:00:00)   EXERCISE AND DIET:  We recommended that you start or continue a regular exercise program for good health. Regular exercise means any activity that makes your heart beat faster and  makes you sweat.  We recommend exercising at least 30 minutes per day at least 3 days a week, preferably 4 or 5.  We also recommend a diet low in fat and sugar.  Inactivity, poor dietary choices and obesity can cause diabetes, heart attack, stroke, and kidney damage, among others.    ALCOHOL AND SMOKING:  Women should limit their alcohol intake to no more than 7 drinks/beers/glasses of wine (combined, not each!) per week. Moderation of alcohol intake to this level decreases your risk of breast cancer and liver damage. And of course, no recreational drugs are part of a healthy lifestyle.  And absolutely no smoking or even second hand smoke. Most people know smoking can cause heart and lung diseases, but did you know it also contributes to weakening of your bones? Aging of your skin?  Yellowing of your teeth and nails?  CALCIUM  AND VITAMIN D:  Adequate intake of calcium  and Vitamin D are recommended.  The recommendations for exact amounts of these supplements seem to change often, but generally speaking 600 mg of calcium  (either carbonate or citrate) and 800 units of Vitamin D per day seems prudent. Certain women may benefit from higher intake of Vitamin D.  If you are among these women, your doctor will have told you during your visit.    PAP SMEARS:  Pap smears, to check for cervical cancer or precancers,  have traditionally been done yearly, although recent scientific advances have shown that most women can have pap smears less often.  However, every woman still should have a physical exam from her gynecologist every year. It will include a breast check, inspection of the vulva and vagina to check for abnormal growths or skin changes, a visual exam of the cervix, and then  an exam to evaluate the size and shape of the uterus and ovaries.  And after 53 years of age, a rectal exam is indicated to check for rectal cancers. We will also provide age appropriate advice regarding health maintenance, like when you  should have certain vaccines, screening for sexually transmitted diseases, bone density testing, colonoscopy, mammograms, etc.   MAMMOGRAMS:  All women over 73 years old should have a yearly mammogram. Many facilities now offer a "3D" mammogram, which may cost around $50 extra out of pocket. If possible,  we recommend you accept the option to have the 3D mammogram performed.  It both reduces the number of women who will be called back for extra views which then turn out to be normal, and it is better than the routine mammogram at detecting truly abnormal areas.    COLONOSCOPY:  Colonoscopy to screen for colon cancer is recommended for all women at age 61.  We know, you hate the idea of the prep.  We agree, BUT, having colon cancer and not knowing it is worse!!  Colon cancer so often starts as a polyp that can be seen and removed at colonscopy, which can quite literally save your life!  And if your first colonoscopy is normal and you have no family history of colon cancer, most women don't have to have it again for 10 years.  Once every ten years, you can do something that may end up saving your life, right?  We will be happy to help you get it scheduled when you are ready.  Be sure to check your insurance coverage so you understand how much it will cost.  It may be covered as a preventative service at no cost, but you should check your particular policy.

## 2023-10-01 ENCOUNTER — Encounter: Payer: Self-pay | Admitting: Emergency Medicine

## 2023-10-01 ENCOUNTER — Ambulatory Visit (INDEPENDENT_AMBULATORY_CARE_PROVIDER_SITE_OTHER): Payer: BC Managed Care – PPO | Admitting: Emergency Medicine

## 2023-10-01 VITALS — BP 128/84 | HR 58 | Temp 97.6°F | Ht 65.25 in | Wt 161.0 lb

## 2023-10-01 DIAGNOSIS — E039 Hypothyroidism, unspecified: Secondary | ICD-10-CM | POA: Diagnosis not present

## 2023-10-01 DIAGNOSIS — E785 Hyperlipidemia, unspecified: Secondary | ICD-10-CM | POA: Diagnosis not present

## 2023-10-01 NOTE — Assessment & Plan Note (Signed)
 Clinically euthyroid Lab Results  Component Value Date   TSH 1.69 03/26/2023  Continue Synthroid 100 mcg daily

## 2023-10-01 NOTE — Assessment & Plan Note (Signed)
Eating better and losing weight.  Feels better. Diet and nutrition discussed Normal last lipid profile July 2024 Continue rosuvastatin 20 mg daily The 10-year ASCVD risk score (Arnett DK, et al., 2019) is: 1.1%   Values used to calculate the score:     Age: 53 years     Sex: Female     Is Non-Hispanic African American: No     Diabetic: No     Tobacco smoker: No     Systolic Blood Pressure: 128 mmHg     Is BP treated: No     HDL Cholesterol: 54.3 mg/dL     Total Cholesterol: 150 mg/dL

## 2023-10-01 NOTE — Patient Instructions (Signed)

## 2023-10-01 NOTE — Progress Notes (Signed)
Lois C Avila-Cisneros 53 y.o.   Chief Complaint  Patient presents with   Follow-up    6 month f/u    HISTORY OF PRESENT ILLNESS: This is a 53 y.o. female here for 25-month follow-up of chronic medical conditions including hypothyroidism and dyslipidemia. Overall doing well.  Has no complaints or medical concerns today.  HPI   Prior to Admission medications   Medication Sig Start Date End Date Taking? Authorizing Provider  Calcium Carbonate (CALCIUM 600 PO) Take by mouth daily.   Yes [provider]  cyclobenzaprine (FLEXERIL) 10 MG tablet Take 1 tablet (10 mg total) by mouth at bedtime. 07/29/23  Yes Georgina Quint, MD  levothyroxine (SYNTHROID) 100 MCG tablet Take 1 tablet (100 mcg total) by mouth daily before breakfast. 04/04/23  Yes Varonica Siharath, Eilleen Kempf, MD  meloxicam (MOBIC) 15 MG tablet Take 1 tablet (15 mg total) by mouth daily. Take only as needed. 07/19/22  Yes Elenore Paddy, NP  methocarbamol (ROBAXIN) 500 MG tablet Take 1 tablet (500 mg total) by mouth 2 (two) times daily as needed for muscle spasms. 07/19/22  Yes Elenore Paddy, NP  rosuvastatin (CRESTOR) 20 MG tablet Take 1 tablet (20 mg total) by mouth daily. 09/18/22  Yes SagardiaEilleen Kempf, MD  VITAMIN D PO Take by mouth daily.   Yes [provider]    No Known Allergies  Patient Active Problem List   Diagnosis Date Noted   History of colonic polyps 09/18/2022   Dyslipidemia 08/18/2019   Hypothyroidism 06/28/2018    Past Medical History:  Diagnosis Date   Allergy    Anemia    Constipation    occasionally- but daily or weekly    Elevated cholesterol    Fibroid    Hypothyroidism 2019   Hypothyroidism 2019   Thyroid disease 2005    Past Surgical History:  Procedure Laterality Date   CESAREAN SECTION     x 1   COLPOSCOPY     DILATATION & CURETTAGE/HYSTEROSCOPY WITH MYOSURE N/A 07/28/2018   Procedure: DILATATION & CURETTAGE/HYSTEROSCOPY WITH MYOSURE;  Surgeon: Patton Salles, MD;  Location: [facility];  Service: Gynecology;  Laterality: N/A;  rep will be here confirmed on 07/27/18   ROBOTIC ASSISTED TOTAL HYSTERECTOMY N/A 09/30/2019   Procedure: XI ROBOTIC ASSISTED TOTAL HYSTERECTOMY GREATER THAN 250 GRAMS;  Surgeon: Adolphus Birchwood, MD;  Location: [facility];  Service: Gynecology;  Laterality: N/A;   TUBAL LIGATION     UPPER GASTROINTESTINAL ENDOSCOPY  01/16/2018   XI ROBOTIC ASSISTED SALPINGECTOMY N/A 09/30/2019   Procedure: XI ROBOTIC ASSISTED SALPINGECTOMY;  Surgeon: Adolphus Birchwood, MD;  Location: [facility];  Service: Gynecology;  Laterality: N/A;    Social History   Socioeconomic History   Marital status: Married    Spouse name: Not on file   Number of children: 3   Years of education: Not on file   Highest education level: Not on file  Occupational History   Not on file  Tobacco Use   Smoking status: Never   Smokeless tobacco: Never  Vaping Use   Vaping status: Never Used  Substance and Sexual Activity   Alcohol use: No   Drug use: No   Sexual activity: Yes    Birth control/protection: Surgical    Comment: Hyst  Other Topics Concern   Not on file  Social History Narrative   Not on file   Social Drivers of Health   Financial Resource Strain: Not on file  Food  Insecurity: Not on file  Transportation Needs: Not on file  Physical Activity: Not on file  Stress: Not on file  Social Connections: Not on file  Intimate Partner Violence: Not on file    Family History  Problem Relation Age of Onset   Heart disease Mother    Rectal cancer Neg Hx    Stomach cancer Neg Hx    Colon cancer Neg Hx    Esophageal cancer Neg Hx    Colon polyps Neg Hx    Breast cancer Neg Hx      Review of Systems  Constitutional: Negative.  Negative for chills and fever.  HENT: Negative.  Negative for congestion and sore throat.   Respiratory: Negative.  Negative for cough and shortness of breath.   Cardiovascular: Negative.  Negative for chest pain and  palpitations.  Gastrointestinal:  Negative for abdominal pain, diarrhea, nausea and vomiting.  Genitourinary: Negative.  Negative for dysuria and hematuria.  Skin: Negative.  Negative for rash.  Neurological: Negative.  Negative for dizziness and headaches.  All other systems reviewed and are negative.   Vitals:   10/01/23 0807  BP: 128/84  Pulse: (!) 58  Temp: 97.6 F (36.4 C)  SpO2: 96%    Physical Exam Vitals reviewed.  Constitutional:      Appearance: Normal appearance.  HENT:     Head: Normocephalic.     Mouth/Throat:     Mouth: Mucous membranes are moist.     Pharynx: Oropharynx is clear.  Eyes:     Extraocular Movements: Extraocular movements intact.     Pupils: Pupils are equal, round, and reactive to light.  Cardiovascular:     Rate and Rhythm: Normal rate and regular rhythm.     Pulses: Normal pulses.     Heart sounds: Normal heart sounds.  Pulmonary:     Effort: Pulmonary effort is normal.     Breath sounds: Normal breath sounds.  Musculoskeletal:     Cervical back: No tenderness.  Lymphadenopathy:     Cervical: No cervical adenopathy.  Skin:    General: Skin is warm and dry.     Capillary Refill: Capillary refill takes less than 2 seconds.  Neurological:     General: No focal deficit present.     Mental Status: She is alert and oriented to person, place, and time.  Psychiatric:        Mood and Affect: Mood normal.        Behavior: Behavior normal.      ASSESSMENT & PLAN: A total of 45 minutes was spent with the patient and counseling/coordination of care regarding preparing for this visit, review of most recent office visit notes, review of multiple chronic medical conditions and their management, review of all medications, review of most recent bloodwork results, review of health maintenance items, education on nutrition, prognosis, documentation, and need for follow up.   Problem List Items Addressed This Visit       Endocrine    Hypothyroidism - Primary   Clinically euthyroid      Lab Results  Component Value Date    TSH 1.69 03/26/2023  Continue Synthroid 100 mcg daily        Other   Dyslipidemia   Eating better and losing weight.  Feels better. Diet and nutrition discussed Normal last lipid profile July 2024 Continue rosuvastatin 20 mg daily The 10-year ASCVD risk score (Arnett DK, et al., 2019) is: 1.1%   Values used to calculate the score:  Age: 48 years     Sex: Female     Is Non-Hispanic African American: No     Diabetic: No     Tobacco smoker: No     Systolic Blood Pressure: 128 mmHg     Is BP treated: No     HDL Cholesterol: 54.3 mg/dL     Total Cholesterol: 150 mg/dL       Patient Instructions  Mantenimiento de la salud en las mujeres Health Maintenance, Female Adoptar un estilo de vida saludable y recibir atencin preventiva son importantes para promover la salud y Counsellor. Consulte al mdico sobre: El esquema adecuado para hacerse pruebas y exmenes peridicos. Cosas que puede hacer por su cuenta para prevenir enfermedades y [place] sano. Qu debo saber sobre la dieta, el peso y el ejercicio? Consuma una dieta saludable  Consuma una dieta que incluya muchas verduras, frutas, productos lcteos con bajo contenido de [place] y Associate Professor. No consuma muchos alimentos ricos en grasas slidas, azcares agregados o sodio. Mantenga un peso saludable El ndice de masa muscular [facility]) se [place] para identificar problemas de [place]. Proporciona una estimacin de la grasa corporal basndose en el peso y la altura. Su mdico puede ayudarle a Engineer, site [facility] y a Personnel officer o Pharmacologist un peso saludable. Haga ejercicio con regularidad Haga ejercicio con regularidad. Esta es una de las prcticas ms importantes que puede hacer por su salud. La [employer] de los adultos deben seguir estas pautas: Education officer, environmental, al menos, 150 minutos de actividad fsica por semana. El ejercicio debe aumentar la  frecuencia cardaca y Media planner transpirar (ejercicio de intensidad moderada). Hacer ejercicios de fortalecimiento por lo [employer] por semana. Agregue esto a su plan de ejercicio de intensidad moderada. Pase menos tiempo sentada. Incluso la actividad fsica ligera puede ser beneficiosa. Controle sus niveles de colesterol y lpidos en la sangre Comience a realizarse anlisis de lpidos y Oncologist en la sangre a los 20 aos y luego reptalos cada 5 aos. Hgase controlar los niveles de colesterol con mayor frecuencia si: Sus niveles de lpidos y colesterol son altos. Es mayor de 40 aos. Presenta un alto riesgo de padecer enfermedades cardacas. Qu debo saber sobre las pruebas de deteccin del cncer? Segn su historia clnica y sus antecedentes familiares, es posible que deba realizarse pruebas de deteccin del cncer en diferentes edades. Esto puede incluir pruebas de deteccin de lo siguiente: Cncer de mama. Cncer de cuello uterino. Cncer colorrectal. Cncer de piel. Cncer de pulmn. Qu debo saber sobre la enfermedad cardaca, la diabetes y la hipertensin arterial? Presin arterial y enfermedad cardaca La hipertensin arterial causa enfermedades cardacas y [place] el riesgo de accidente cerebrovascular. Es ms probable que esto se manifieste en las personas que tienen lecturas de presin arterial alta o tienen sobrepeso. Hgase controlar la presin arterial: Cada 3 a 5 aos si tiene entre 18 y 78 aos. Todos los aos si es mayor de 40 aos. Diabetes Realcese exmenes de deteccin de la diabetes con regularidad. Este anlisis revisa el nivel de azcar en la sangre en [place]. Hgase las pruebas de deteccin: Cada tres aos despus de los 40 aos de edad si tiene un peso normal y un bajo riesgo de padecer diabetes. Con ms frecuencia y a partir de [place] edad inferior si tiene sobrepeso o un alto riesgo de padecer diabetes. Qu debo saber sobre la prevencin de  infecciones? Hepatitis B Si tiene un riesgo ms alto de contraer hepatitis B, debe someterse a Nurse, learning disability de  deteccin de este virus. Hable con el mdico para averiguar si tiene riesgo de contraer la infeccin por hepatitis B. Hepatitis C Se recomienda el anlisis a: [employer] 1945 y 1965. Todas las personas que tengan un riesgo de haber contrado hepatitis C. Enfermedades de transmisin sexual (ETS) Hgase las pruebas de Airline pilot de ITS, incluidas la gonorrea y la clamidia, si: Es sexualmente activa y es menor de [address]. Es mayor de [address], y Public affairs consultant informa que corre riesgo de tener este tipo de infecciones. La actividad sexual ha cambiado desde que le hicieron la ltima prueba de deteccin y tiene un riesgo mayor de Warehouse manager clamidia o Copy. Pregntele al mdico si usted tiene riesgo. Pregntele al mdico si usted tiene un alto riesgo de Primary school teacher VIH. El mdico tambin puede recomendarle un medicamento recetado para ayudar a evitar la infeccin por el VIH. Si elige tomar medicamentos para prevenir el VIH, primero debe [employer] de deteccin del VIH. Luego debe hacerse anlisis cada 3 meses mientras est tomando los medicamentos. Embarazo Si est por dejar de Armed forces training and education officer (fase premenopusica) y usted puede quedar [place], busque asesoramiento antes de [place]. Tome de 400 a 800 microgramos (mcg) de cido [employer] si [place]. Pida mtodos de control de la natalidad (anticonceptivos) si desea evitar un embarazo no deseado. Osteoporosis y [place] La osteoporosis es una enfermedad en la que los huesos pierden los minerales y la fuerza por el avance de la edad. El resultado pueden ser fracturas en los [place]. Si tiene 65 aos o ms, o si est en riesgo de sufrir osteoporosis y fracturas, pregunte a su mdico si debe: Hacerse pruebas de deteccin de prdida sea. Tomar un suplemento de calcio o de vitamina D para reducir el  riesgo de fracturas. Recibir terapia de reemplazo hormonal (TRH) para tratar los sntomas de la menopausia. Siga estas indicaciones en su casa: Consumo de alcohol No beba alcohol si: Su mdico le indica no hacerlo. Est embarazada, puede estar embarazada o est tratando de [place]. Si bebe alcohol: Limite la cantidad que bebe a lo siguiente: De 0 a 1 bebida por da. Sepa cunta cantidad de alcohol hay en las bebidas que toma. En los [address], una medida equivale a una botella de cerveza de 12 oz (355 ml), un vaso de vino de 5 oz (148 ml) o un vaso de una bebida alcohlica de alta graduacin de 1 oz (44 ml). Estilo de vida No consuma ningn producto que contenga nicotina o tabaco. Estos productos incluyen cigarrillos, tabaco para Theatre manager y aparatos de vapeo, como los Administrator, Civil Service. Si necesita ayuda para dejar de consumir estos productos, consulte al mdico. No consuma drogas. No comparta agujas. Solicite ayuda a su mdico si necesita apoyo o informacin para abandonar las drogas. Indicaciones generales Realcese los estudios de rutina de [address], dentales y de Wellsite geologist. Mantngase al da con las vacunas. Infrmele a su mdico si: Se siente deprimida con frecuencia. Alguna vez ha sido vctima de [place] o no se siente seguro en su casa. Resumen Adoptar un estilo de vida saludable y recibir atencin preventiva son importantes para promover la salud y Counsellor. Siga las instrucciones del mdico acerca de una dieta saludable, el ejercicio y la realizacin de pruebas o exmenes para Hotel manager. Siga las instrucciones del mdico con respecto al control del colesterol y la presin arterial. Esta informacin no tiene Theme park manager el consejo del mdico. Asegrese de  hacerle al mdico cualquier pregunta que tenga. Document Revised: 02/01/2021 Document Reviewed: 02/01/2021 Elsevier Patient Education  2024 Elsevier Inc.     Edwina Barth,  MD [facility]

## 2023-11-13 ENCOUNTER — Other Ambulatory Visit: Payer: Self-pay | Admitting: Emergency Medicine

## 2023-11-13 DIAGNOSIS — E785 Hyperlipidemia, unspecified: Secondary | ICD-10-CM

## 2023-11-13 NOTE — Telephone Encounter (Signed)
 Copied from CRM [id number]. Topic: Clinical - Medication Refill >> Nov 13, 2023  3:26 PM [name] wrote: Most Recent Primary Care Visit:  Provider: Georgina Quint  Department[facility]  Visit Type: OFFICE VISIT  Date: 10/01/2023  Medication: rosuvastatin   Has the patient contacted their pharmacy? Yes  (Agent: If no, request that the patient contact the pharmacy for the refill. If patient does not wish to contact the pharmacy document the reason why and proceed with request.) (Agent: If yes, when and what did the pharmacy advise?)  Is this the correct pharmacy for this prescription? Yes If no, delete pharmacy and type the correct one.  This is the patient's preferred pharmacy:   [facility] [address] [place] - [address] [place] [address] Phone: [phone] Fax: [phone]   Has the prescription been filled recently? Yes  Is the patient out of the medication? No, patient still has a few left   Has the patient been seen for an appointment in the last year OR does the patient have an upcoming appointment? Yes  Can we respond through MyChart? Yes  Agent: Please be advised that Rx refills may take up to 3 business days. We ask that you follow-up with your pharmacy.

## 2023-11-18 MED ORDER — ROSUVASTATIN CALCIUM 20 MG PO TABS: 20.0000 mg | ORAL_TABLET | Freq: Every day | ORAL | 3 refills | Status: DC

## 2024-03-31 ENCOUNTER — Ambulatory Visit (INDEPENDENT_AMBULATORY_CARE_PROVIDER_SITE_OTHER): Payer: BC Managed Care – PPO | Admitting: Emergency Medicine

## 2024-03-31 ENCOUNTER — Ambulatory Visit: Payer: Self-pay | Admitting: Emergency Medicine

## 2024-03-31 ENCOUNTER — Encounter: Payer: Self-pay | Admitting: Emergency Medicine

## 2024-03-31 VITALS — BP 124/88 | HR 60 | Temp 97.8°F | Ht 65.25 in | Wt 167.0 lb

## 2024-03-31 DIAGNOSIS — E039 Hypothyroidism, unspecified: Secondary | ICD-10-CM

## 2024-03-31 DIAGNOSIS — E785 Hyperlipidemia, unspecified: Secondary | ICD-10-CM | POA: Diagnosis not present

## 2024-03-31 DIAGNOSIS — M549 Dorsalgia, unspecified: Secondary | ICD-10-CM | POA: Diagnosis not present

## 2024-03-31 LAB — TSH: TSH: 1.12 u[IU]/mL (ref 0.35–5.50)

## 2024-03-31 LAB — LIPID PANEL
Cholesterol: 161 mg/dL (ref 0–200)
HDL: 56.6 mg/dL (ref >39.00)
LDL Cholesterol: 85 mg/dL (ref 0–99)
NonHDL: 104.44
Total CHOL/HDL Ratio: 3
Triglycerides: 95 mg/dL (ref 0.0–149.0)
VLDL: 19 mg/dL (ref 0.0–40.0)

## 2024-03-31 LAB — CBC WITH DIFFERENTIAL/PLATELET
Basophils Absolute: 0 K/uL (ref 0.0–0.1)
Basophils Relative: 0.3 % (ref 0.0–3.0)
Eosinophils Absolute: 0.2 K/uL (ref 0.0–0.7)
Eosinophils Relative: 3.3 % (ref 0.0–5.0)
HCT: 42.7 % (ref 36.0–46.0)
Hemoglobin: 14.2 g/dL (ref 12.0–15.0)
Lymphocytes Relative: 42.5 % (ref 12.0–46.0)
Lymphs Abs: 2 K/uL (ref 0.7–4.0)
MCHC: 33.3 g/dL (ref 30.0–36.0)
MCV: 79.7 fl (ref 78.0–100.0)
Monocytes Absolute: 0.3 K/uL (ref 0.1–1.0)
Monocytes Relative: 6.4 % (ref 3.0–12.0)
Neutro Abs: 2.2 K/uL (ref 1.4–7.7)
Neutrophils Relative %: 47.5 % (ref 43.0–77.0)
Platelets: 221 K/uL (ref 150.0–400.0)
RBC: 5.35 Mil/uL — ABNORMAL HIGH (ref 3.87–5.11)
RDW: 13.1 % (ref 11.5–15.5)
WBC: 4.7 K/uL (ref 4.0–10.5)

## 2024-03-31 LAB — COMPREHENSIVE METABOLIC PANEL WITH GFR
ALT: 19 U/L (ref 0–35)
AST: 19 U/L (ref 0–37)
Albumin: 4.4 g/dL (ref 3.5–5.2)
Alkaline Phosphatase: 77 U/L (ref 39–117)
BUN: 17 mg/dL (ref 6–23)
CO2: 27 meq/L (ref 19–32)
Calcium: 9.3 mg/dL (ref 8.4–10.5)
Chloride: 103 meq/L (ref 96–112)
Creatinine, Ser: 0.57 mg/dL (ref 0.40–1.20)
GFR: 104.27 mL/min (ref >60.00)
Glucose, Bld: 91 mg/dL (ref 70–99)
Potassium: 3.9 meq/L (ref 3.5–5.1)
Sodium: 138 meq/L (ref 135–145)
Total Bilirubin: 0.8 mg/dL (ref 0.2–1.2)
Total Protein: 7.6 g/dL (ref 6.0–8.3)

## 2024-03-31 LAB — HEMOGLOBIN A1C: Hgb A1c MFr Bld: 6.1 % (ref 4.6–6.5)

## 2024-03-31 MED ORDER — LEVOTHYROXINE SODIUM 100 MCG PO TABS: 100.0000 ug | ORAL_TABLET | Freq: Every day | ORAL | 3 refills | Status: AC

## 2024-03-31 MED ORDER — ROSUVASTATIN CALCIUM 20 MG PO TABS: 20.0000 mg | ORAL_TABLET | Freq: Every day | ORAL | 3 refills | Status: AC

## 2024-03-31 NOTE — Progress Notes (Signed)
 Kelli Hale 53 y.o.   Chief Complaint  Patient presents with   Follow-up    6 month f/u. Patient has been having pain in her left shoulder/arm for about a month.     HISTORY OF PRESENT ILLNESS: This is a 53 y.o. female here for 46-month follow-up of chronic medical conditions Overall doing well.  Occasionally has some pain to left upper back/shoulder area related to daily physical activities No other complaints or medical concerns today.  HPI   Prior to Admission medications   Medication Sig Start Date End Date Taking? Authorizing Provider  Calcium  Carbonate (CALCIUM  600 PO) Take by mouth daily. Patient not taking: Reported on 03/31/2024    [provider]  cyclobenzaprine  (FLEXERIL ) 10 MG tablet Take 1 tablet (10 mg total) by mouth at bedtime. Patient not taking: Reported on 03/31/2024 07/29/23   Purcell Emil Schanz, MD  levothyroxine  (SYNTHROID ) 100 MCG tablet Take 1 tablet (100 mcg total) by mouth daily before breakfast. 04/04/23   Purcell Emil Schanz, MD  meloxicam  (MOBIC ) 15 MG tablet Take 1 tablet (15 mg total) by mouth daily. Take only as needed. Patient not taking: Reported on 03/31/2024 07/19/22   Elnor Lauraine BRAVO, NP  methocarbamol  (ROBAXIN ) 500 MG tablet Take 1 tablet (500 mg total) by mouth 2 (two) times daily as needed for muscle spasms. Patient not taking: Reported on 03/31/2024 07/19/22   Elnor Lauraine BRAVO, NP  rosuvastatin  (CRESTOR ) 20 MG tablet Take 1 tablet (20 mg total) by mouth daily. 11/18/23   Purcell Emil Schanz, MD  VITAMIN D PO Take by mouth daily. Patient not taking: Reported on 03/31/2024    [provider]    No Known Allergies  Patient Active Problem List   Diagnosis Date Noted   History of colonic polyps 09/18/2022   Dyslipidemia 08/18/2019   Hypothyroidism 06/28/2018    Past Medical History:  Diagnosis Date   Allergy    Anemia    Constipation    occasionally- but daily or weekly    Elevated cholesterol    Fibroid     Hypothyroidism 2019   Hypothyroidism 2019   Thyroid  disease 2005    Past Surgical History:  Procedure Laterality Date   CESAREAN SECTION     x 1   COLPOSCOPY     DILATATION & CURETTAGE/HYSTEROSCOPY WITH MYOSURE N/A 07/28/2018   Procedure: DILATATION & CURETTAGE/HYSTEROSCOPY WITH MYOSURE;  Surgeon: Cathlyn JAYSON Nikki Bobie BRAVO, MD;  Location: [facility];  Service: Gynecology;  Laterality: N/A;  rep will be here confirmed on 07/27/18   ROBOTIC ASSISTED TOTAL HYSTERECTOMY N/A 09/30/2019   Procedure: XI ROBOTIC ASSISTED TOTAL HYSTERECTOMY GREATER THAN 250 GRAMS;  Surgeon: Eloy Herring, MD;  Location: [facility];  Service: Gynecology;  Laterality: N/A;   TUBAL LIGATION     UPPER GASTROINTESTINAL ENDOSCOPY  01/16/2018   XI ROBOTIC ASSISTED SALPINGECTOMY N/A 09/30/2019   Procedure: XI ROBOTIC ASSISTED SALPINGECTOMY;  Surgeon: Eloy Herring, MD;  Location: [facility];  Service: Gynecology;  Laterality: N/A;    Social History   Socioeconomic History   Marital status: Married    Spouse name: Not on file   Number of children: 3   Years of education: Not on file   Highest education level: 6th grade  Occupational History   Not on file  Tobacco Use   Smoking status: Never   Smokeless tobacco: Never  Vaping Use   Vaping status: Never Used  Substance and Sexual Activity   Alcohol use: No  Drug use: No   Sexual activity: Yes    Birth control/protection: Surgical    Comment: Hyst  Other Topics Concern   Not on file  Social History Narrative   Not on file   Social Drivers of Health   Financial Resource Strain: Low Risk  (03/30/2024)   Overall Financial Resource Strain (CARDIA)    Difficulty of Paying Living Expenses: Not hard at all  Food Insecurity: No Food Insecurity (03/30/2024)   Hunger Vital Sign    Worried About Running Out of Food in the Last Year: Never true    Ran Out of Food in the Last Year: Never true  Transportation Needs: No Transportation Needs (03/30/2024)   PRAPARE - Therapist, art (Medical): No    Lack of Transportation (Non-Medical): No  Physical Activity: Sufficiently Active (03/30/2024)   Exercise Vital Sign    Days of Exercise per Week: 5 days    Minutes of Exercise per Session: 70 min  Stress: No Stress Concern Present (03/30/2024)   [employer] of Occupational Health - Occupational Stress Questionnaire    Feeling of Stress: Only a little  Social Connections: Moderately Integrated (03/30/2024)   Social Connection and Isolation Panel    Frequency of Communication with Friends and Family: Twice a week    Frequency of Social Gatherings with Friends and Family: Once a week    Attends Religious Services: More than 4 times per year    Active Member of [employer] or Organizations: No    Attends Engineer, structural: Not on file    Marital Status: Married  Catering manager Violence: Not on file    Family History  Problem Relation Age of Onset   Heart disease Mother    Rectal cancer Neg Hx    Stomach cancer Neg Hx    Colon cancer Neg Hx    Esophageal cancer Neg Hx    Colon polyps Neg Hx    Breast cancer Neg Hx      Review of Systems  Constitutional: Negative.  Negative for chills and fever.  HENT: Negative.  Negative for congestion and sore throat.   Respiratory: Negative.  Negative for cough and shortness of breath.   Cardiovascular: Negative.  Negative for chest pain and palpitations.  Gastrointestinal:  Negative for abdominal pain, diarrhea, nausea and vomiting.  Genitourinary: Negative.  Negative for dysuria and hematuria.  Musculoskeletal:        Left arm/shoulder pain  Skin: Negative.  Negative for rash.  Neurological: Negative.  Negative for dizziness and headaches.  All other systems reviewed and are negative.   Today's Vitals   03/31/24 0752  BP: 124/88  Pulse: 60  Temp: 97.8 F (36.6 C)  TempSrc: Oral  SpO2: 93%  Weight: 167 lb (75.8 kg)  Height: 5' 5.25 (1.657 m)   Body mass index is 27.58  kg/m.   Physical Exam Vitals reviewed.  Constitutional:      Appearance: Normal appearance.  HENT:     Head: Normocephalic.     Mouth/Throat:     Mouth: Mucous membranes are moist.     Pharynx: Oropharynx is clear.  Eyes:     Extraocular Movements: Extraocular movements intact.     Pupils: Pupils are equal, round, and reactive to light.  Cardiovascular:     Rate and Rhythm: Normal rate and regular rhythm.     Pulses: Normal pulses.     Heart sounds: Normal heart sounds.  Pulmonary:  Effort: Pulmonary effort is normal.     Breath sounds: Normal breath sounds.  Abdominal:     Palpations: Abdomen is soft.     Tenderness: There is no abdominal tenderness.  Skin:    General: Skin is warm and dry.     Capillary Refill: Capillary refill takes less than 2 seconds.  Neurological:     General: No focal deficit present.     Mental Status: She is alert and oriented to person, place, and time.  Psychiatric:        Mood and Affect: Mood normal.        Behavior: Behavior normal.      ASSESSMENT & PLAN: A total of 45 minutes was spent with the patient and counseling/coordination of care regarding preparing for this visit, review of most recent office visit notes, review of multiple chronic medical conditions and their management, review of all medications, review of most recent bloodwork results, review of health maintenance items, education on nutrition, prognosis, documentation, and need for follow up.   Problem List Items Addressed This Visit       Endocrine   Hypothyroidism   Lab Results  Component Value Date   TSH 1.69 03/26/2023  Clinically euthyroid TSH done today Continue Synthroid  100 mcg daily       Relevant Medications   levothyroxine  (SYNTHROID ) 100 MCG tablet   Other Relevant Orders   TSH   CBC with Differential/Platelet     Other   Dyslipidemia - Primary   Lab Results  Component Value Date   CHOL 150 03/26/2023   HDL 54.30 03/26/2023   LDLCALC 77  03/26/2023   TRIG 92.0 03/26/2023   CHOLHDL 3 03/26/2023  Lipid profile done today Continue rosuvastatin  20 mg daily Diet and nutrition discussed Cardiovascular risks associated with dyslipidemia discussed Benefits of exercise discussed       Relevant Medications   rosuvastatin  (CRESTOR ) 20 MG tablet   Other Relevant Orders   CBC with Differential/Platelet   Hemoglobin A1c   Comprehensive metabolic panel with GFR   Lipid panel   Musculoskeletal back pain   Mostly to left trapezius area Mechanical and related to daily activities. Advised to take Tylenol  as needed.      Patient Instructions  Mantenimiento de Radiographer, therapeutic en las mujeres Health Maintenance, Female Adoptar un estilo de vida saludable y recibir atencin preventiva son importantes para promover la salud y Counsellor. Consulte al mdico sobre: El esquema adecuado para hacerse pruebas y exmenes peridicos. Cosas que puede hacer por su cuenta para prevenir enfermedades y [place] sano. Qu debo saber sobre la dieta, el peso y el ejercicio? Consuma una dieta saludable  Consuma una dieta que incluya muchas verduras, frutas, productos lcteos con bajo contenido de grasa y protenas magras. No consuma muchos alimentos ricos en grasas slidas, azcares agregados o sodio. Mantenga un peso saludable El ndice de masa muscular [facility]) se [place] para identificar problemas de [place]. Proporciona una estimacin de la grasa corporal basndose en el peso y la altura. Su mdico puede ayudarle a determinar su IMC y a Personnel officer o Pharmacologist un peso saludable. Haga ejercicio con regularidad Haga ejercicio con regularidad. Esta es una de las prcticas ms importantes que puede hacer por su salud. La [employer] de los adultos deben seguir estas pautas: Education officer, environmental, al menos, 150 minutos de actividad fsica por semana. El ejercicio debe aumentar la frecuencia cardaca y Media planner transpirar (ejercicio de intensidad moderada). Hacer ejercicios de  fortalecimiento por lo [facility]  por semana. Agregue esto a su plan de ejercicio de intensidad moderada. Pase menos tiempo sentada. Incluso la actividad fsica ligera puede ser beneficiosa. Controle sus niveles de colesterol y lpidos en la sangre Comience a realizarse anlisis de lpidos y Oncologist en la sangre a los 20 aos y luego reptalos cada 5 aos. Hgase controlar los niveles de colesterol con mayor frecuencia si: Sus niveles de lpidos y colesterol son altos. Es mayor de 40 aos. Presenta un alto riesgo de padecer enfermedades cardacas. Qu debo saber sobre las pruebas de deteccin del cncer? Segn su historia clnica y sus antecedentes familiares, es posible que deba realizarse pruebas de deteccin del cncer en diferentes edades. Esto puede incluir pruebas de deteccin de lo siguiente: Cncer de mama. Cncer de cuello uterino. Cncer colorrectal. Cncer de piel. Cncer de pulmn. Qu debo saber sobre la enfermedad cardaca, la diabetes y la hipertensin arterial? Presin arterial y enfermedad cardaca La hipertensin arterial causa enfermedades cardacas y [place]otho el riesgo de accidente cerebrovascular. Es ms probable que esto se manifieste en las personas que tienen lecturas de presin arterial alta o tienen sobrepeso. Hgase controlar la presin arterial: Cada 3 a 5 aos si tiene entre 18 y 1 aos. Todos los aos si es mayor de 40 aos. Diabetes Realcese exmenes de deteccin de la diabetes con regularidad. Este anlisis revisa el nivel de azcar en la sangre en [place]. Hgase las pruebas de deteccin: Cada tres aos despus de los 40 aos de edad si tiene un peso normal y un bajo riesgo de padecer diabetes. Con ms frecuencia y a partir de [place] edad inferior si tiene sobrepeso o un alto riesgo de padecer diabetes. Qu debo saber sobre la prevencin de infecciones? Hepatitis B Si tiene un riesgo ms alto de contraer hepatitis B, debe someterse a un examen de  deteccin de este virus. Hable con el mdico para averiguar si tiene riesgo de contraer la infeccin por hepatitis B. Hepatitis C Se recomienda el anlisis a: [employer] 1945 y 1965. Todas las personas que tengan un riesgo de haber contrado hepatitis C. Enfermedades de transmisin sexual (ETS) Hgase las pruebas de Airline pilot de ITS, incluidas la gonorrea y la clamidia, si: Es sexualmente activa y es menor de [address]. Es mayor de [address], y Public affairs consultant informa que corre riesgo de tener este tipo de infecciones. La actividad sexual ha cambiado desde que le hicieron la ltima prueba de deteccin y tiene un riesgo mayor de tener clamidia o Copy. Pregntele al mdico si usted tiene riesgo. Pregntele al mdico si usted tiene un alto riesgo de Primary school teacher VIH. El mdico tambin puede recomendarle un medicamento recetado para ayudar a evitar la infeccin por el VIH. Si elige tomar medicamentos para prevenir el VIH, primero debe [employer] de deteccin del VIH. Luego debe hacerse anlisis cada 3 meses mientras est tomando los medicamentos. Embarazo Si est por dejar de menstruar (fase premenopusica) y usted puede quedar embarazada, busque asesoramiento antes de quedar embarazada. Tome de 400 a 800 microgramos (mcg) de cido flico todos los das si queda embarazada. Pida mtodos de control de la natalidad (anticonceptivos) si desea evitar un embarazo no deseado. Osteoporosis y [place] La osteoporosis es una enfermedad en la que los huesos pierden los minerales y la fuerza por el avance de la edad. El resultado pueden ser fracturas en los [place]. Si tiene 65 aos o ms, o si est en riesgo de sufrir osteoporosis y fracturas, pregunte a su  mdico si debe: Hacerse pruebas de deteccin de prdida sea. Tomar un suplemento de calcio o de vitamina D para reducir el riesgo de fracturas. Recibir terapia de reemplazo hormonal (TRH) para tratar los sntomas de la  menopausia. Siga estas indicaciones en su casa: Consumo de alcohol No beba alcohol si: Su mdico le indica no hacerlo. Est embarazada, puede estar embarazada o est tratando de quedar embarazada. Si bebe alcohol: Limite la cantidad que bebe a lo siguiente: De 0 a 1 bebida por da. Sepa cunta cantidad de alcohol hay en las bebidas que toma. En los [address], una medida equivale a una botella de cerveza de 12 oz (355 ml), un vaso de vino de 5 oz (148 ml) o un vaso de una bebida alcohlica de alta graduacin de 1 oz (44 ml). Estilo de vida No consuma ningn producto que contenga nicotina o tabaco. Estos productos incluyen cigarrillos, tabaco para Theatre manager y aparatos de vapeo, como los cigarrillos electrnicos. Si necesita ayuda para dejar de consumir estos productos, consulte al mdico. No consuma drogas. No comparta agujas. Solicite ayuda a su mdico si necesita apoyo o informacin para abandonar las drogas. Indicaciones generales Realcese los estudios de rutina de [address], dentales y de Wellsite geologist. Mantngase al da con las vacunas. Infrmele a su mdico si: Se siente deprimida con frecuencia. Alguna vez ha sido vctima de [place] o no se siente seguro en su casa. Resumen Adoptar un estilo de vida saludable y recibir atencin preventiva son importantes para promover la salud y Counsellor. Siga las instrucciones del mdico acerca de una dieta saludable, el ejercicio y la realizacin de pruebas o exmenes para Hotel manager. Siga las instrucciones del mdico con respecto al control del colesterol y la presin arterial. Esta informacin no tiene Theme park manager el consejo del mdico. Asegrese de hacerle al mdico cualquier pregunta que tenga. Document Revised: 02/01/2021 Document Reviewed: 02/01/2021 Elsevier Patient Education  2024 Elsevier Inc.      Emil Schaumann, MD [facility]

## 2024-03-31 NOTE — Assessment & Plan Note (Addendum)
 Lab Results  Component Value Date   TSH 1.69 03/26/2023  Clinically euthyroid TSH done today Continue Synthroid  100 mcg daily

## 2024-03-31 NOTE — Patient Instructions (Signed)
 Mantenimiento de Radiographer, therapeutic en las mujeres Health Maintenance, Female Adoptar un estilo de vida saludable y recibir atencin preventiva son importantes para promover la salud y Counsellor. Consulte al mdico sobre: El esquema adecuado para hacerse pruebas y exmenes peridicos. Cosas que puede hacer por su cuenta para prevenir enfermedades y [place] sano. Qu debo saber sobre la dieta, el peso y el ejercicio? Consuma una dieta saludable  Consuma una dieta que incluya muchas verduras, frutas, productos lcteos con bajo contenido de [place] y Associate Professor. No consuma muchos alimentos ricos en grasas slidas, azcares agregados o sodio. Mantenga un peso saludable El ndice de masa muscular [facility]) se [place] para identificar problemas de [place]. Proporciona una estimacin de la grasa corporal basndose en el peso y la altura. Su mdico puede ayudarle a Engineer, site IMC y a Personnel officer o Pharmacologist un peso saludable. Haga ejercicio con regularidad Haga ejercicio con regularidad. Esta es una de las prcticas ms importantes que puede hacer por su salud. La [employer] de los adultos deben seguir estas pautas: Education officer, environmental, al menos, 150 minutos de actividad fsica por semana. El ejercicio debe aumentar la frecuencia cardaca y Media planner transpirar (ejercicio de intensidad moderada). Hacer ejercicios de fortalecimiento por lo [facility] por semana. Agregue esto a su plan de ejercicio de intensidad moderada. Pase menos tiempo sentada. Incluso la actividad fsica ligera puede ser beneficiosa. Controle sus niveles de colesterol y lpidos en la sangre Comience a realizarse anlisis de lpidos y Oncologist en la sangre a los 20 aos y luego reptalos cada 5 aos. Hgase controlar los niveles de colesterol con mayor frecuencia si: Sus niveles de lpidos y colesterol son altos. Es mayor de 40 aos. Presenta un alto riesgo de padecer enfermedades cardacas. Qu debo saber sobre las pruebas de deteccin del  cncer? Segn su historia clnica y sus antecedentes familiares, es posible que deba realizarse pruebas de deteccin del cncer en diferentes edades. Esto puede incluir pruebas de deteccin de lo siguiente: Cncer de mama. Cncer de cuello uterino. Cncer colorrectal. Cncer de piel. Cncer de pulmn. Qu debo saber sobre la enfermedad cardaca, la diabetes y la hipertensin arterial? Presin arterial y enfermedad cardaca La hipertensin arterial causa enfermedades cardacas y [place] el riesgo de accidente cerebrovascular. Es ms probable que esto se manifieste en las personas que tienen lecturas de presin arterial alta o tienen sobrepeso. Hgase controlar la presin arterial: Cada 3 a 5 aos si tiene entre 18 y 50 aos. Todos los aos si es mayor de 40 aos. Diabetes Realcese exmenes de deteccin de la diabetes con regularidad. Este anlisis revisa el nivel de azcar en la sangre en [place]. Hgase las pruebas de deteccin: Cada tres aos despus de los 40 aos de edad si tiene un peso normal y un bajo riesgo de padecer diabetes. Con ms frecuencia y a partir de [place] edad inferior si tiene sobrepeso o un alto riesgo de padecer diabetes. Qu debo saber sobre la prevencin de infecciones? Hepatitis B Si tiene un riesgo ms alto de contraer hepatitis B, debe someterse a un examen de deteccin de este virus. Hable con el mdico para averiguar si tiene riesgo de contraer la infeccin por hepatitis B. Hepatitis C Se recomienda el anlisis a: [employer] 1945 y 1965. Todas las personas que tengan un riesgo de haber contrado hepatitis C. Enfermedades de transmisin sexual (ETS) Hgase las pruebas de Airline pilot de ITS, incluidas la gonorrea y la clamidia, si: Es sexualmente activa y es Adult nurse de 24  aos. Es mayor de 24 aos, y el mdico le informa que corre riesgo de tener este tipo de infecciones. La actividad sexual ha cambiado desde que le hicieron la ltima prueba de  deteccin y tiene un riesgo mayor de Warehouse manager clamidia o Copy. Pregntele al mdico si usted tiene riesgo. Pregntele al mdico si usted tiene un alto riesgo de Primary school teacher VIH. El mdico tambin puede recomendarle un medicamento recetado para ayudar a evitar la infeccin por el VIH. Si elige tomar medicamentos para prevenir el VIH, primero debe [employer] de deteccin del VIH. Luego debe hacerse anlisis cada 3 meses mientras est tomando los medicamentos. Embarazo Si est por dejar de Armed forces training and education officer (fase premenopusica) y usted puede quedar [place], busque asesoramiento antes de [place]. Tome de 400 a 800 microgramos (mcg) de cido [employer] si [place]. Pida mtodos de control de la natalidad (anticonceptivos) si desea evitar un embarazo no deseado. Osteoporosis y [place] La osteoporosis es una enfermedad en la que los huesos pierden los minerales y la fuerza por el avance de la edad. El resultado pueden ser fracturas en los [place]. Si tiene 65 aos o ms, o si est en riesgo de sufrir osteoporosis y fracturas, pregunte a su mdico si debe: Hacerse pruebas de deteccin de prdida sea. Tomar un suplemento de calcio o de vitamina D para reducir el riesgo de fracturas. Recibir terapia de reemplazo hormonal (TRH) para tratar los sntomas de la menopausia. Siga estas indicaciones en su casa: Consumo de alcohol No beba alcohol si: Su mdico le indica no hacerlo. Est embarazada, puede estar embarazada o est tratando de [place]. Si bebe alcohol: Limite la cantidad que bebe a lo siguiente: De 0 a 1 bebida por da. Sepa cunta cantidad de alcohol hay en las bebidas que toma. En los [address], una medida equivale a una botella de cerveza de 12 oz (355 ml), un vaso de vino de 5 oz (148 ml) o un vaso de una bebida alcohlica de alta graduacin de 1 oz (44 ml). Estilo de vida No consuma ningn producto que contenga nicotina o tabaco. Estos  productos incluyen cigarrillos, tabaco para Theatre manager y aparatos de vapeo, como los Administrator, Civil Service. Si necesita ayuda para dejar de consumir estos productos, consulte al mdico. No consuma drogas. No comparta agujas. Solicite ayuda a su mdico si necesita apoyo o informacin para abandonar las drogas. Indicaciones generales Realcese los estudios de rutina de [address], dentales y de Wellsite geologist. Mantngase al da con las vacunas. Infrmele a su mdico si: Se siente deprimida con frecuencia. Alguna vez ha sido vctima de [place] o no se siente seguro en su casa. Resumen Adoptar un estilo de vida saludable y recibir atencin preventiva son importantes para promover la salud y Counsellor. Siga las instrucciones del mdico acerca de una dieta saludable, el ejercicio y la realizacin de pruebas o exmenes para Hotel manager. Siga las instrucciones del mdico con respecto al control del colesterol y la presin arterial. Esta informacin no tiene Theme park manager el consejo del mdico. Asegrese de hacerle al mdico cualquier pregunta que tenga. Document Revised: 02/01/2021 Document Reviewed: 02/01/2021 Elsevier Patient Education  2024 [employer].

## 2024-03-31 NOTE — Assessment & Plan Note (Signed)
 Mostly to left trapezius area Mechanical and related to daily activities. Advised to take Tylenol  as needed.

## 2024-03-31 NOTE — Assessment & Plan Note (Addendum)
 Lab Results  Component Value Date   CHOL 150 03/26/2023   HDL 54.30 03/26/2023   LDLCALC 77 03/26/2023   TRIG 92.0 03/26/2023   CHOLHDL 3 03/26/2023  Lipid profile done today Continue rosuvastatin  20 mg daily Diet and nutrition discussed Cardiovascular risks associated with dyslipidemia discussed Benefits of exercise discussed

## 2024-04-01 LAB — ABO/RH: Rh Factor: POSITIVE

## 2024-06-30 ENCOUNTER — Other Ambulatory Visit: Payer: Self-pay | Admitting: Emergency Medicine

## 2024-06-30 DIAGNOSIS — Z1231 Encounter for screening mammogram for malignant neoplasm of breast: Secondary | ICD-10-CM

## 2024-07-20 ENCOUNTER — Ambulatory Visit
Admission: RE | Admit: 2024-07-20 | Discharge: 2024-07-20 | Disposition: A | Discharge status: Home / Self Care | Source: Ambulatory Visit | Attending: Emergency Medicine | Admitting: Emergency Medicine

## 2024-07-20 DIAGNOSIS — Z1231 Encounter for screening mammogram for malignant neoplasm of breast: Secondary | ICD-10-CM

## 2024-07-21 ENCOUNTER — Ambulatory Visit: Payer: Self-pay | Admitting: Emergency Medicine

## 2024-09-29 ENCOUNTER — Encounter: Payer: Self-pay | Admitting: Obstetrics and Gynecology

## 2024-09-29 ENCOUNTER — Ambulatory Visit (INDEPENDENT_AMBULATORY_CARE_PROVIDER_SITE_OTHER): Payer: BC Managed Care – PPO | Admitting: Obstetrics and Gynecology

## 2024-09-29 VITALS — BP 120/84 | HR 70 | Ht 65.0 in | Wt 162.0 lb

## 2024-09-29 DIAGNOSIS — Z1331 Encounter for screening for depression: Secondary | ICD-10-CM

## 2024-09-29 DIAGNOSIS — Z01419 Encounter for gynecological examination (general) (routine) without abnormal findings: Secondary | ICD-10-CM

## 2024-09-29 DIAGNOSIS — N760 Acute vaginitis: Secondary | ICD-10-CM | POA: Diagnosis not present

## 2024-09-29 LAB — WET PREP FOR TRICH, YEAST, CLUE

## 2024-09-29 MED ORDER — METRONIDAZOLE 500 MG PO TABS: 500.0000 mg | ORAL_TABLET | Freq: Two times a day (BID) | ORAL | 0 refills | Status: AC

## 2024-09-29 NOTE — Patient Instructions (Addendum)
 You can try Good Clean Love with Vitamin E from [employer] store to hydrate the vagina.   EXERCISE AND DIET:  We recommended that you start or continue a regular exercise program for good health. Regular exercise means any activity that makes your heart beat faster and makes you sweat.  We recommend exercising at least 30 minutes per day at least 3 days a week, preferably 4 or 5.  We also recommend a diet low in fat and sugar.  Inactivity, poor dietary choices and obesity can cause diabetes, heart attack, stroke, and kidney damage, among others.    ALCOHOL AND SMOKING:  Women should limit their alcohol intake to no more than 7 drinks/beers/glasses of wine (combined, not each!) per week. Moderation of alcohol intake to this level decreases your risk of breast cancer and liver damage. And of course, no recreational drugs are part of a healthy lifestyle.  And absolutely no smoking or even second hand smoke. Most people know smoking can cause heart and lung diseases, but did you know it also contributes to weakening of your bones? Aging of your skin?  Yellowing of your teeth and nails?  CALCIUM  AND VITAMIN D:  Adequate intake of calcium  and Vitamin D are recommended.  The recommendations for exact amounts of these supplements seem to change often, but generally speaking 600 mg of calcium  (either carbonate or citrate) and 800 units of Vitamin D per day seems prudent. Certain women may benefit from higher intake of Vitamin D.  If you are among these women, your doctor will have told you during your visit.    PAP SMEARS:  Pap smears, to check for cervical cancer or precancers,  have traditionally been done yearly, although recent scientific advances have shown that most women can have pap smears less often.  However, every woman still should have a physical exam from her gynecologist every year. It will include a breast check, inspection of the vulva and vagina to check for abnormal growths or skin changes, a visual  exam of the cervix, and then an exam to evaluate the size and shape of the uterus and ovaries.  And after 54 years of age, a rectal exam is indicated to check for rectal cancers. We will also provide age appropriate advice regarding health maintenance, like when you should have certain vaccines, screening for sexually transmitted diseases, bone density testing, colonoscopy, mammograms, etc.   MAMMOGRAMS:  All women over 53 years old should have a yearly mammogram. Many facilities now offer a 3D mammogram, which may cost around $50 extra out of pocket. If possible,  we recommend you accept the option to have the 3D mammogram performed.  It both reduces the number of women who will be called back for extra views which then turn out to be normal, and it is better than the routine mammogram at detecting truly abnormal areas.    COLONOSCOPY:  Colonoscopy to screen for colon cancer is recommended for all women at age 61.  We know, you hate the idea of the prep.  We agree, BUT, having colon cancer and not knowing it is worse!!  Colon cancer so often starts as a polyp that can be seen and removed at colonscopy, which can quite literally save your life!  And if your first colonoscopy is normal and you have no family history of colon cancer, most women don't have to have it again for 10 years.  Once every ten years, you can do something that may end up saving  your life, right?  We will be happy to help you get it scheduled when you are ready.  Be sure to check your insurance coverage so you understand how much it will cost.  It may be covered as a preventative service at no cost, but you should check your particular policy.

## 2024-09-29 NOTE — Progress Notes (Unsigned)
 "  54 y.o. G69P2003 Married Hispanic female here for annual exam.    No hot flashes.  Has vaginal dryness every now and then.   Uses coconut oil.    Some itching with wiping after using the restroom.  No discharge.   PCP: Purcell Emil Schanz, MD   Patient's last menstrual period was 09/24/2019.           Sexually active: No The current method of family planning is status post hysterectomy.    Menopausal hormone therapy:  n/a Exercising: Yes.    Walking  Smoker:  no  OB History  Gravida Para Term Preterm AB Living  3 2 2  0 0 3  SAB IAB Ectopic Multiple Live Births  0 0 0 0 3    # Outcome Date GA Lbr Len/2nd Weight Sex Type Anes PTL Lv  3 Gravida 2006    F CS-Classical   LIV  2 Term 74    M Vag-Spont   LIV  1 Term 65    F Vag-Spont   LIV     HEALTH MAINTENANCE: Last 2 paps:  05/27/18 neg, HPV neg  History of abnormal Pap or positive HPV:  no Mammogram:   07/20/24 Breast Density Cat C, BIRADS Cat 1 neg  Colonoscopy:  04/30/18 - due in 2024.  [place].  Letter printer for patient.  Bone Density:  n/a  Result  n/a   Immunization History  Administered Date(s) Administered   Influenza Inj Mdck Quad Pf 06/30/2018   Influenza,inj,Quad PF,6+ Mos 08/24/2020, 09/06/2021, 09/18/2022   Tdap 01/02/2018   Zoster Recombinant(Shingrix ) 03/18/2022, 09/18/2022      reports that she has never smoked. She has never used smokeless tobacco. She reports that she does not drink alcohol and does not use drugs.  Past Medical History:  Diagnosis Date   Allergy    Anemia    Constipation    occasionally- but daily or weekly    Elevated cholesterol    Fibroid    Hypothyroidism 2019   Hypothyroidism 2019   Thyroid  disease 2005    Past Surgical History:  Procedure Laterality Date   CESAREAN SECTION     x 1   COLPOSCOPY     DILATATION & CURETTAGE/HYSTEROSCOPY WITH MYOSURE N/A 07/28/2018   Procedure: DILATATION & CURETTAGE/HYSTEROSCOPY WITH MYOSURE;  Surgeon: Cathlyn JAYSON Nikki Bobie FORBES, MD;  Location: [facility];  Service: Gynecology;  Laterality: N/A;  rep will be here confirmed on 07/27/18   ROBOTIC ASSISTED TOTAL HYSTERECTOMY N/A 09/30/2019   Procedure: XI ROBOTIC ASSISTED TOTAL HYSTERECTOMY GREATER THAN 250 GRAMS;  Surgeon: Eloy Herring, MD;  Location: [facility];  Service: Gynecology;  Laterality: N/A;   TUBAL LIGATION     UPPER GASTROINTESTINAL ENDOSCOPY  01/16/2018   XI ROBOTIC ASSISTED SALPINGECTOMY N/A 09/30/2019   Procedure: XI ROBOTIC ASSISTED SALPINGECTOMY;  Surgeon: Eloy Herring, MD;  Location: [facility];  Service: Gynecology;  Laterality: N/A;    Current Outpatient Medications  Medication Sig Dispense Refill   levothyroxine  (SYNTHROID ) 100 MCG tablet Take 1 tablet (100 mcg total) by mouth daily before breakfast. 90 tablet 3   rosuvastatin  (CRESTOR ) 20 MG tablet Take 1 tablet (20 mg total) by mouth daily. 90 tablet 3   No current facility-administered medications for this visit.    ALLERGIES: Patient has no known allergies.  Family History  Problem Relation Age of Onset   Heart disease Mother    Rectal cancer Neg Hx    Stomach cancer Neg Hx  Colon cancer Neg Hx    Esophageal cancer Neg Hx    Colon polyps Neg Hx    Breast cancer Neg Hx     Review of Systems  All other systems reviewed and are negative.   PHYSICAL EXAM:  BP 120/84 (BP Location: Left Arm, Patient Position: Sitting)   Pulse 70   Ht 5' 5 (1.651 m)   Wt 162 lb (73.5 kg)   LMP 09/24/2019   SpO2 95%   BMI 26.96 kg/m     General appearance: alert, cooperative and appears stated age Head: normocephalic, without obvious abnormality, atraumatic Neck: no adenopathy, supple, symmetrical, trachea midline and thyroid  normal to inspection and palpation Lungs: clear to auscultation bilaterally Breasts: normal appearance, no masses or tenderness, No nipple retraction or dimpling, No nipple discharge or bleeding, No axillary adenopathy Heart: regular rate and rhythm Abdomen: soft, non-tender;  no masses, no organomegaly Extremities: extremities normal, atraumatic, no cyanosis or edema Skin: skin color, texture, turgor normal. No rashes or lesions Lymph nodes: cervical, supraclavicular, and axillary nodes normal. Neurologic: grossly normal  Pelvic: External genitalia:  no lesions              No abnormal inguinal nodes palpated.              Urethra:  normal appearing urethra with no masses, tenderness or lesions              Bartholins and Skenes: normal                 Vagina: normal appearing vagina with normal color and discharge, no lesions              Cervix: absent              Pap taken: no Bimanual Exam:  Uterus:  absent              Adnexa: no mass, fullness, tenderness              Rectal exam: yes.  Confirms.              Anus:  normal sphincter tone, no lesions  Chaperone was present for exam:  Kari HERO, CMA  ASSESSMENT: Well woman visit with gynecologic exam. Status post robotically assisted hysterectomy with bilateral salpingectomy.  Ovaries remain.  Vaginal atrophy. Hypothyroidism.  Managed by her PCP.  PHQ-2-9: 0  PLAN: Mammogram screening discussed. Self breast awareness reviewed. Pap and HRV collected:  no.  Not indicated. Guidelines for Calcium , Vitamin D, regular exercise program including cardiovascular and weight bearing exercise. Medication refills:  *** Wet prep:  clue cells, neg yeast, neg trich Declines vaginal estrogen.  She will try Good Clean Love with vit E.  Labs with PCP.  Letter with colonoscopy results given to patient so she will schedule her follow up.    Follow up:  yearly and prn.     Additional counseling given.  {yes [id number]. ***  total time was spent for this patient encounter, including preparation, face-to-face counseling with the patient, coordination of care, and documentation of the encounter in addition to doing the well woman visit with gynecologic exam.        "

## 2024-09-30 ENCOUNTER — Ambulatory Visit: Payer: Self-pay | Admitting: Obstetrics and Gynecology

## 2024-10-07 ENCOUNTER — Encounter: Payer: Self-pay | Admitting: Emergency Medicine

## 2024-10-07 ENCOUNTER — Ambulatory Visit: Admitting: Emergency Medicine

## 2024-10-07 ENCOUNTER — Ambulatory Visit: Payer: Self-pay | Admitting: Emergency Medicine

## 2024-10-07 VITALS — BP 120/70 | HR 68 | Temp 98.0°F | Ht 65.0 in | Wt 164.0 lb

## 2024-10-07 DIAGNOSIS — R7303 Prediabetes: Secondary | ICD-10-CM | POA: Diagnosis not present

## 2024-10-07 DIAGNOSIS — E039 Hypothyroidism, unspecified: Secondary | ICD-10-CM | POA: Diagnosis not present

## 2024-10-07 DIAGNOSIS — E785 Hyperlipidemia, unspecified: Secondary | ICD-10-CM | POA: Diagnosis not present

## 2024-10-07 LAB — LIPID PANEL
Cholesterol: 121 mg/dL (ref 28–200)
HDL: 47 mg/dL
LDL Cholesterol: 62 mg/dL (ref 10–99)
NonHDL: 74.32
Total CHOL/HDL Ratio: 3
Triglycerides: 64 mg/dL (ref 10.0–149.0)
VLDL: 12.8 mg/dL (ref 0.0–40.0)

## 2024-10-07 LAB — COMPREHENSIVE METABOLIC PANEL WITH GFR
ALT: 24 U/L (ref 3–35)
AST: 22 U/L (ref 5–37)
Albumin: 4.4 g/dL (ref 3.5–5.2)
Alkaline Phosphatase: 86 U/L (ref 39–117)
BUN: 14 mg/dL (ref 6–23)
CO2: 30 meq/L (ref 19–32)
Calcium: 8.9 mg/dL (ref 8.4–10.5)
Chloride: 105 meq/L (ref 96–112)
Creatinine, Ser: 0.63 mg/dL (ref 0.40–1.20)
GFR: 101.41 mL/min
Glucose, Bld: 91 mg/dL (ref 70–99)
Potassium: 3.9 meq/L (ref 3.5–5.1)
Sodium: 140 meq/L (ref 135–145)
Total Bilirubin: 0.7 mg/dL (ref 0.2–1.2)
Total Protein: 7.5 g/dL (ref 6.0–8.3)

## 2024-10-07 LAB — POCT GLYCOSYLATED HEMOGLOBIN (HGB A1C): HbA1c POC (<> result, manual entry): 5.8 %

## 2024-10-07 LAB — CBC WITH DIFFERENTIAL/PLATELET
Basophils Absolute: 0 10*3/uL (ref 0.0–0.1)
Basophils Relative: 0.5 % (ref 0.0–3.0)
Eosinophils Absolute: 0.1 10*3/uL (ref 0.0–0.7)
Eosinophils Relative: 2.8 % (ref 0.0–5.0)
HCT: 41.7 % (ref 36.0–46.0)
Hemoglobin: 14 g/dL (ref 12.0–15.0)
Lymphocytes Relative: 34.5 % (ref 12.0–46.0)
Lymphs Abs: 1.7 10*3/uL (ref 0.7–4.0)
MCHC: 33.5 g/dL (ref 30.0–36.0)
MCV: 80.3 fl (ref 78.0–100.0)
Monocytes Absolute: 0.4 10*3/uL (ref 0.1–1.0)
Monocytes Relative: 7.5 % (ref 3.0–12.0)
Neutro Abs: 2.6 10*3/uL (ref 1.4–7.7)
Neutrophils Relative %: 54.7 % (ref 43.0–77.0)
Platelets: 246 10*3/uL (ref 150.0–400.0)
RBC: 5.19 Mil/uL — ABNORMAL HIGH (ref 3.87–5.11)
RDW: 13.7 % (ref 11.5–15.5)
WBC: 4.8 10*3/uL (ref 4.0–10.5)

## 2024-10-07 LAB — TSH: TSH: 5.28 u[IU]/mL (ref 0.35–5.50)

## 2024-10-07 NOTE — Assessment & Plan Note (Addendum)
 Lab Results  Component Value Date   TSH 1.12 03/31/2024  Clinically euthyroid.  No concerns. TSH done today Continue levothyroxine  100 mcg daily Diet and nutrition discussed

## 2024-10-07 NOTE — Progress Notes (Signed)
 Kelli Hale 54 y.o.   Chief Complaint  Patient presents with   Follow-up    6 months     HISTORY OF PRESENT ILLNESS: This is a 54 y.o. female A1A here for 70-month follow-up of chronic medical conditions Was able to recently follow-up with her gynecologist.  Clean bill of health. Overall doing well.  Staying physically active still working Eating well. Has no complaints or medical concerns today.  HPI   Prior to Admission medications  Medication Sig Start Date End Date Taking? Authorizing Provider  levothyroxine  (SYNTHROID ) 100 MCG tablet Take 1 tablet (100 mcg total) by mouth daily before breakfast. 03/31/24  Yes Leatrice Parilla, Emil Schanz, MD  metroNIDAZOLE  (FLAGYL ) 500 MG tablet Take 1 tablet (500 mg total) by mouth 2 (two) times daily. 09/29/24  Yes Cathlyn JAYSON Nikki Bobie FORBES, MD  rosuvastatin  (CRESTOR ) 20 MG tablet Take 1 tablet (20 mg total) by mouth daily. 03/31/24  Yes [place] Emil Schanz, MD    Allergies[1]  Patient Active Problem List   Diagnosis Date Noted   History of colonic polyps 09/18/2022   Dyslipidemia 08/18/2019   Hypothyroidism 06/28/2018    Past Medical History:  Diagnosis Date   Allergy    Anemia    Constipation    occasionally- but daily or weekly    Elevated cholesterol    Fibroid    Hypothyroidism 2019   Hypothyroidism 2019   Thyroid  disease 2005    Past Surgical History:  Procedure Laterality Date   CESAREAN SECTION     x 1   COLPOSCOPY     DILATATION & CURETTAGE/HYSTEROSCOPY WITH MYOSURE N/A 07/28/2018   Procedure: DILATATION & CURETTAGE/HYSTEROSCOPY WITH MYOSURE;  Surgeon: Cathlyn JAYSON Nikki Bobie FORBES, MD;  Location: [facility];  Service: Gynecology;  Laterality: N/A;  rep will be here confirmed on 07/27/18   ROBOTIC ASSISTED TOTAL HYSTERECTOMY N/A 09/30/2019   Procedure: XI ROBOTIC ASSISTED TOTAL HYSTERECTOMY GREATER THAN 250 GRAMS;  Surgeon: Eloy Herring, MD;  Location: [facility];  Service: Gynecology;  Laterality: N/A;   TUBAL LIGATION      UPPER GASTROINTESTINAL ENDOSCOPY  01/16/2018   XI ROBOTIC ASSISTED SALPINGECTOMY N/A 09/30/2019   Procedure: XI ROBOTIC ASSISTED SALPINGECTOMY;  Surgeon: Eloy Herring, MD;  Location: [facility];  Service: Gynecology;  Laterality: N/A;    Social History   Socioeconomic History   Marital status: Married    Spouse name: Not on file   Number of children: 3   Years of education: Not on file   Highest education level: 6th grade  Occupational History   Not on file  Tobacco Use   Smoking status: Never   Smokeless tobacco: Never  Vaping Use   Vaping status: Never Used  Substance and Sexual Activity   Alcohol use: No   Drug use: No   Sexual activity: Not Currently    Partners: Male    Birth control/protection: Surgical    Comment: Hyst  Other Topics Concern   Not on file  Social History Narrative   Not on file   Social Drivers of Health   Tobacco Use: Low Risk (10/07/2024)   Patient History    Smoking Tobacco Use: Never    Smokeless Tobacco Use: Never    Passive Exposure: Not on file  Financial Resource Strain: Low Risk (03/30/2024)   Overall Financial Resource Strain (CARDIA)    Difficulty of Paying Living Expenses: Not hard at all  Food Insecurity: No Food Insecurity (03/30/2024)   Epic    Worried  About Running Out of Food in the Last Year: Never true    Ran Out of Food in the Last Year: Never true  Transportation Needs: No Transportation Needs (03/30/2024)   Epic    Lack of Transportation (Medical): No    Lack of Transportation (Non-Medical): No  Physical Activity: Sufficiently Active (03/30/2024)   Exercise Vital Sign    Days of Exercise per Week: 5 days    Minutes of Exercise per Session: 70 min  Stress: No Stress Concern Present (03/30/2024)   [employer] of Occupational Health - Occupational Stress Questionnaire    Feeling of Stress: Only a little  Social Connections: Moderately Integrated (03/30/2024)   Social Connection and Isolation Panel    Frequency of  Communication with Friends and Family: Twice a week    Frequency of Social Gatherings with Friends and Family: Once a week    Attends Religious Services: More than 4 times per year    Active Member of [employer] or Organizations: No    Attends Engineer, Structural: Not on file    Marital Status: Married  Catering Manager Violence: Not on file  Depression (PHQ2-9): Low Risk (09/29/2024)   Depression (PHQ2-9)    PHQ-2 Score: 0  Alcohol Screen: Not on file  Housing: Low Risk (03/30/2024)   Epic    Unable to Pay for Housing in the Last Year: No    Number of Times Moved in the Last Year: 0    Homeless in the Last Year: No  Utilities: Not on file  Health Literacy: Not on file    Family History  Problem Relation Age of Onset   Heart disease Mother    Rectal cancer Neg Hx    Stomach cancer Neg Hx    Colon cancer Neg Hx    Esophageal cancer Neg Hx    Colon polyps Neg Hx    Breast cancer Neg Hx      Review of Systems  Constitutional: Negative.  Negative for chills and fever.  HENT: Negative.  Negative for congestion and sore throat.   Respiratory: Negative.  Negative for cough and shortness of breath.   Cardiovascular: Negative.  Negative for chest pain and palpitations.  Gastrointestinal:  Negative for abdominal pain, diarrhea, nausea and vomiting.  Genitourinary: Negative.  Negative for dysuria and hematuria.  Skin: Negative.  Negative for rash.  Neurological: Negative.  Negative for dizziness and headaches.  All other systems reviewed and are negative.   Today's Vitals   10/07/24 0754 10/07/24 0804 10/07/24 0823  BP: (!) 120/100 (!) 120/100 120/70  Pulse: 68    Temp: 98 F (36.7 C)    TempSrc: Oral    SpO2: 98%    Weight: 164 lb (74.4 kg)    Height: 5' 5 (1.651 m)     Body mass index is 27.29 kg/m.  Body mass index is 27.29 kg/m.   Physical Exam Vitals reviewed.  Constitutional:      Appearance: Normal appearance.  HENT:     Head: Normocephalic.      Mouth/Throat:     Mouth: Mucous membranes are moist.     Pharynx: Oropharynx is clear.  Eyes:     Extraocular Movements: Extraocular movements intact.     Pupils: Pupils are equal, round, and reactive to light.  Cardiovascular:     Rate and Rhythm: Normal rate and regular rhythm.     Pulses: Normal pulses.     Heart sounds: Normal heart sounds.  Pulmonary:  Effort: Pulmonary effort is normal.     Breath sounds: Normal breath sounds.  Abdominal:     Palpations: Abdomen is soft.     Tenderness: There is no abdominal tenderness.  Musculoskeletal:     Cervical back: No tenderness.     Right lower leg: No edema.     Left lower leg: No edema.  Lymphadenopathy:     Cervical: No cervical adenopathy.  Skin:    General: Skin is warm and dry.  Neurological:     General: No focal deficit present.     Mental Status: She is alert and oriented to person, place, and time.  Psychiatric:        Mood and Affect: Mood normal.        Behavior: Behavior normal.    Results for orders placed or performed in visit on 10/07/24 (from the past 24 hours)  POCT HgB A1C     Status: Abnormal   Collection Time: 10/07/24  8:13 AM  Result Value Ref Range   Hemoglobin A1C     HbA1c POC (<> result, manual entry) 5.8 4.0 - 5.6 %   HbA1c, POC (prediabetic range)     HbA1c, POC (controlled diabetic range)       ASSESSMENT & PLAN: A total of 40 minutes was spent with the patient and counseling/coordination of care regarding preparing for this visit, review of most recent office visit notes, review of multiple chronic medical conditions and their management, cardiovascular risks associated with dyslipidemia, review of all medications, review of most recent bloodwork results including interpretation of today's hemoglobin A1c, review of health maintenance items, education on nutrition, prognosis, documentation, and need for follow up.   Problem List Items Addressed This Visit       Endocrine    Hypothyroidism - Primary   Lab Results  Component Value Date   TSH 1.12 03/31/2024  Clinically euthyroid.  No concerns. TSH done today Continue levothyroxine  100 mcg daily Diet and nutrition discussed       Relevant Orders   TSH   CBC with Differential/Platelet     Other   Dyslipidemia   Lab Results  Component Value Date   CHOL 161 03/31/2024   HDL 56.60 03/31/2024   LDLCALC 85 03/31/2024   TRIG 95.0 03/31/2024   CHOLHDL 3 03/31/2024  The 10-year ASCVD risk score (Arnett DK, et al., 2019) is: 1.1%   Values used to calculate the score:     Age: 45 years     Clinically relevant sex: Female     Is Non-Hispanic African American: No     Diabetic: No     Tobacco smoker: No     Systolic Blood Pressure: 120 mmHg     Is BP treated: No     HDL Cholesterol: 56.6 mg/dL     Total Cholesterol: 161 mg/dL Chronic stable condition Lipid profile done today Continue rosuvastatin  20 mg daily Diet and nutrition discussed Stays physically active.  Benefits of exercise discussed      Relevant Orders   POCT HgB A1C (Completed)   CBC with Differential/Platelet   Comprehensive metabolic panel with GFR   Lipid panel   Prediabetes   Hemoglobin A1c today at 5.8 Diet and nutrition discussed Cardiovascular risks associated with diabetes and dyslipidemia discussed Blood work done today Follow-up in 6 months      Relevant Orders   POCT HgB A1C (Completed)   CBC with Differential/Platelet   Comprehensive metabolic panel with GFR   Lipid  panel   Patient Instructions  Mantenimiento de la salud en las mujeres Health Maintenance, Female Adoptar un estilo de vida saludable y recibir atencin preventiva son importantes para promover la salud y counsellor. Consulte al mdico sobre: El esquema adecuado para hacerse pruebas y exmenes peridicos. Cosas que puede hacer por su cuenta para prevenir enfermedades y [place] sano. Qu debo saber sobre la dieta, el peso y el ejercicio? Consuma  una dieta saludable  Consuma una dieta que incluya muchas verduras, frutas, productos lcteos con bajo contenido de grasa y protenas magras. No consuma muchos alimentos ricos en grasas slidas, azcares agregados o sodio. Mantenga un peso saludable El ndice de masa muscular [facility]) se utiliza para identificar problemas de [place]. Proporciona una estimacin de la grasa corporal basndose en el peso y la altura. Su mdico puede ayudarle a determinar su [facility] y a personnel officer o pharmacologist un peso saludable. Haga ejercicio con regularidad Haga ejercicio con regularidad. Esta es una de las prcticas ms importantes que puede hacer por su salud. La [employer] de los adultos deben seguir estas pautas: Education Officer, Environmental, al menos, 150 minutos de actividad fsica por semana. El ejercicio debe aumentar la frecuencia cardaca y media planner transpirar (ejercicio de intensidad moderada). Hacer ejercicios de fortalecimiento por lo [facility] por semana. Agregue esto a su plan de ejercicio de intensidad moderada. Pase menos tiempo sentada. Incluso la actividad fsica ligera puede ser beneficiosa. Controle sus niveles de colesterol y lpidos en la sangre Comience a realizarse anlisis de lpidos y oncologist en la sangre a los 20 aos y luego reptalos cada 5 aos. Hgase controlar los niveles de colesterol con mayor frecuencia si: Sus niveles de lpidos y colesterol son altos. Es mayor de 40 aos. Presenta un alto riesgo de padecer enfermedades cardacas. Qu debo saber sobre las pruebas de deteccin del cncer? Segn su historia clnica y sus antecedentes familiares, es posible que deba realizarse pruebas de deteccin del cncer en diferentes edades. Esto puede incluir pruebas de deteccin de lo siguiente: Cncer de mama. Cncer de cuello uterino. Cncer colorrectal. Cncer de piel. Cncer de pulmn. Qu debo saber sobre la enfermedad cardaca, la diabetes y la hipertensin arterial? Presin arterial y enfermedad cardaca La  hipertensin arterial causa enfermedades cardacas y aumenta el riesgo de accidente cerebrovascular. Es ms probable que esto se manifieste en las personas que tienen lecturas de presin arterial alta o tienen sobrepeso. Hgase controlar la presin arterial: Cada 3 a 5 aos si tiene entre 18 y 25 aos. Todos los aos si es mayor de 40 aos. Diabetes Realcese exmenes de deteccin de la diabetes con regularidad. Este anlisis revisa el nivel de azcar en la sangre en [place]. Hgase las pruebas de deteccin: Cada tres aos despus de los 40 aos de edad si tiene un peso normal y un bajo riesgo de padecer diabetes. Con ms frecuencia y a partir de [place] edad inferior si tiene sobrepeso o un alto riesgo de padecer diabetes. Qu debo saber sobre la prevencin de infecciones? Hepatitis B Si tiene un riesgo ms alto de contraer hepatitis B, debe someterse a un examen de deteccin de este virus. Hable con el mdico para averiguar si tiene riesgo de contraer la infeccin por hepatitis B. Hepatitis C Se recomienda el anlisis a: [employer] 1945 y 1965. Todas las personas que tengan un riesgo de haber contrado hepatitis C. Enfermedades de transmisin sexual (ETS) Hgase las pruebas de airline pilot de ITS, incluidas la gonorrea y la clamidia, si: Es  sexualmente activa y es menor de [address]. Es mayor de [address], y public affairs consultant informa que corre riesgo de tener este tipo de infecciones. La actividad sexual ha cambiado desde que le hicieron la ltima prueba de deteccin y tiene un riesgo mayor de tener clamidia o copy. Pregntele al mdico si usted tiene riesgo. Pregntele al mdico si usted tiene un alto riesgo de primary school teacher VIH. El mdico tambin puede recomendarle un medicamento recetado para ayudar a evitar la infeccin por el VIH. Si elige tomar medicamentos para prevenir el VIH, primero debe oneok de deteccin del VIH. Luego debe hacerse anlisis cada 3 meses mientras est  tomando los medicamentos. Embarazo Si est por dejar de menstruar (fase premenopusica) y usted puede quedar embarazada, busque asesoramiento antes de quedar embarazada. Tome de 400 a 800 microgramos (mcg) de cido flico todos los das si queda embarazada. Pida mtodos de control de la natalidad (anticonceptivos) si desea evitar un embarazo no deseado. Osteoporosis y menopausia La osteoporosis es una enfermedad en la que los huesos pierden los minerales y la fuerza por el avance de la edad. El resultado pueden ser fracturas en los [place]. Si tiene 65 aos o ms, o si est en riesgo de sufrir osteoporosis y fracturas, pregunte a su mdico si debe: Hacerse pruebas de deteccin de prdida sea. Tomar un suplemento de calcio o de vitamina D para reducir el riesgo de fracturas. Recibir terapia de reemplazo hormonal (TRH) para tratar los sntomas de la menopausia. Siga estas indicaciones en su casa: Consumo de alcohol No beba alcohol si: Su mdico le indica no hacerlo. Est embarazada, puede estar embarazada o est tratando de quedar embarazada. Si bebe alcohol: Limite la cantidad que bebe a lo siguiente: De 0 a 1 bebida por da. Sepa cunta cantidad de alcohol hay en las bebidas que toma. En los [address], una medida equivale a una botella de cerveza de 12 oz (355 ml), un vaso de vino de 5 oz (148 ml) o un vaso de una bebida alcohlica de alta graduacin de 1 oz (44 ml). Estilo de vida No consuma ningn producto que contenga nicotina o tabaco. Estos productos incluyen cigarrillos, tabaco para theatre manager y aparatos de vapeo, como los cigarrillos electrnicos. Si necesita ayuda para dejar de consumir estos productos, consulte al mdico. No consuma drogas. No comparta agujas. Solicite ayuda a su mdico si necesita apoyo o informacin para abandonar las drogas. Indicaciones generales Realcese los estudios de rutina de [address], dentales y de wellsite geologist. Mantngase al da con las  vacunas. Infrmele a su mdico si: Se siente deprimida con frecuencia. Alguna vez ha sido vctima de maltrato o no se siente seguro en su casa. Resumen Adoptar un estilo de vida saludable y recibir atencin preventiva son importantes para promover la salud y counsellor. Siga las instrucciones del mdico acerca de una dieta saludable, el ejercicio y la realizacin de pruebas o exmenes para hotel manager. Siga las instrucciones del mdico con respecto al control del colesterol y la presin arterial. Esta informacin no tiene theme park manager el consejo del mdico. Asegrese de hacerle al mdico cualquier pregunta que tenga. Document Revised: 02/01/2021 Document Reviewed: 02/01/2021 Elsevier Patient Education  2024 Elsevier Inc.     Emil Schaumann, MD [facility]    [1] No Known Allergies

## 2024-10-07 NOTE — Assessment & Plan Note (Addendum)
 Lab Results  Component Value Date   CHOL 161 03/31/2024   HDL 56.60 03/31/2024   LDLCALC 85 03/31/2024   TRIG 95.0 03/31/2024   CHOLHDL 3 03/31/2024  The 10-year ASCVD risk score (Arnett DK, et al., 2019) is: 1.1%   Values used to calculate the score:     Age: 54 years     Clinically relevant sex: Female     Is Non-Hispanic African American: No     Diabetic: No     Tobacco smoker: No     Systolic Blood Pressure: 120 mmHg     Is BP treated: No     HDL Cholesterol: 56.6 mg/dL     Total Cholesterol: 161 mg/dL Chronic stable condition Lipid profile done today Continue rosuvastatin  20 mg daily Diet and nutrition discussed Stays physically active.  Benefits of exercise discussed

## 2024-10-07 NOTE — Patient Instructions (Signed)
 Mantenimiento de Radiographer, therapeutic en las mujeres Health Maintenance, Female Adoptar un estilo de vida saludable y recibir atencin preventiva son importantes para promover la salud y Counsellor. Consulte al mdico sobre: El esquema adecuado para hacerse pruebas y exmenes peridicos. Cosas que puede hacer por su cuenta para prevenir enfermedades y [place] sano. Qu debo saber sobre la dieta, el peso y el ejercicio? Consuma una dieta saludable  Consuma una dieta que incluya muchas verduras, frutas, productos lcteos con bajo contenido de [place] y Associate Professor. No consuma muchos alimentos ricos en grasas slidas, azcares agregados o sodio. Mantenga un peso saludable El ndice de masa muscular [facility]) se [place] para identificar problemas de [place]. Proporciona una estimacin de la grasa corporal basndose en el peso y la altura. Su mdico puede ayudarle a Engineer, site IMC y a Personnel officer o Pharmacologist un peso saludable. Haga ejercicio con regularidad Haga ejercicio con regularidad. Esta es una de las prcticas ms importantes que puede hacer por su salud. La [employer] de los adultos deben seguir estas pautas: Education officer, environmental, al menos, 150 minutos de actividad fsica por semana. El ejercicio debe aumentar la frecuencia cardaca y Media planner transpirar (ejercicio de intensidad moderada). Hacer ejercicios de fortalecimiento por lo [facility] por semana. Agregue esto a su plan de ejercicio de intensidad moderada. Pase menos tiempo sentada. Incluso la actividad fsica ligera puede ser beneficiosa. Controle sus niveles de colesterol y lpidos en la sangre Comience a realizarse anlisis de lpidos y Oncologist en la sangre a los 20 aos y luego reptalos cada 5 aos. Hgase controlar los niveles de colesterol con mayor frecuencia si: Sus niveles de lpidos y colesterol son altos. Es mayor de 40 aos. Presenta un alto riesgo de padecer enfermedades cardacas. Qu debo saber sobre las pruebas de deteccin del  cncer? Segn su historia clnica y sus antecedentes familiares, es posible que deba realizarse pruebas de deteccin del cncer en diferentes edades. Esto puede incluir pruebas de deteccin de lo siguiente: Cncer de mama. Cncer de cuello uterino. Cncer colorrectal. Cncer de piel. Cncer de pulmn. Qu debo saber sobre la enfermedad cardaca, la diabetes y la hipertensin arterial? Presin arterial y enfermedad cardaca La hipertensin arterial causa enfermedades cardacas y [place] el riesgo de accidente cerebrovascular. Es ms probable que esto se manifieste en las personas que tienen lecturas de presin arterial alta o tienen sobrepeso. Hgase controlar la presin arterial: Cada 3 a 5 aos si tiene entre 18 y 50 aos. Todos los aos si es mayor de 40 aos. Diabetes Realcese exmenes de deteccin de la diabetes con regularidad. Este anlisis revisa el nivel de azcar en la sangre en [place]. Hgase las pruebas de deteccin: Cada tres aos despus de los 40 aos de edad si tiene un peso normal y un bajo riesgo de padecer diabetes. Con ms frecuencia y a partir de [place] edad inferior si tiene sobrepeso o un alto riesgo de padecer diabetes. Qu debo saber sobre la prevencin de infecciones? Hepatitis B Si tiene un riesgo ms alto de contraer hepatitis B, debe someterse a un examen de deteccin de este virus. Hable con el mdico para averiguar si tiene riesgo de contraer la infeccin por hepatitis B. Hepatitis C Se recomienda el anlisis a: [employer] 1945 y 1965. Todas las personas que tengan un riesgo de haber contrado hepatitis C. Enfermedades de transmisin sexual (ETS) Hgase las pruebas de Airline pilot de ITS, incluidas la gonorrea y la clamidia, si: Es sexualmente activa y es Adult nurse de 24  aos. Es mayor de 24 aos, y el mdico le informa que corre riesgo de tener este tipo de infecciones. La actividad sexual ha cambiado desde que le hicieron la ltima prueba de  deteccin y tiene un riesgo mayor de Warehouse manager clamidia o Copy. Pregntele al mdico si usted tiene riesgo. Pregntele al mdico si usted tiene un alto riesgo de Primary school teacher VIH. El mdico tambin puede recomendarle un medicamento recetado para ayudar a evitar la infeccin por el VIH. Si elige tomar medicamentos para prevenir el VIH, primero debe [employer] de deteccin del VIH. Luego debe hacerse anlisis cada 3 meses mientras est tomando los medicamentos. Embarazo Si est por dejar de Armed forces training and education officer (fase premenopusica) y usted puede quedar [place], busque asesoramiento antes de [place]. Tome de 400 a 800 microgramos (mcg) de cido [employer] si [place]. Pida mtodos de control de la natalidad (anticonceptivos) si desea evitar un embarazo no deseado. Osteoporosis y [place] La osteoporosis es una enfermedad en la que los huesos pierden los minerales y la fuerza por el avance de la edad. El resultado pueden ser fracturas en los [place]. Si tiene 65 aos o ms, o si est en riesgo de sufrir osteoporosis y fracturas, pregunte a su mdico si debe: Hacerse pruebas de deteccin de prdida sea. Tomar un suplemento de calcio o de vitamina D para reducir el riesgo de fracturas. Recibir terapia de reemplazo hormonal (TRH) para tratar los sntomas de la menopausia. Siga estas indicaciones en su casa: Consumo de alcohol No beba alcohol si: Su mdico le indica no hacerlo. Est embarazada, puede estar embarazada o est tratando de [place]. Si bebe alcohol: Limite la cantidad que bebe a lo siguiente: De 0 a 1 bebida por da. Sepa cunta cantidad de alcohol hay en las bebidas que toma. En los [address], una medida equivale a una botella de cerveza de 12 oz (355 ml), un vaso de vino de 5 oz (148 ml) o un vaso de una bebida alcohlica de alta graduacin de 1 oz (44 ml). Estilo de vida No consuma ningn producto que contenga nicotina o tabaco. Estos  productos incluyen cigarrillos, tabaco para Theatre manager y aparatos de vapeo, como los Administrator, Civil Service. Si necesita ayuda para dejar de consumir estos productos, consulte al mdico. No consuma drogas. No comparta agujas. Solicite ayuda a su mdico si necesita apoyo o informacin para abandonar las drogas. Indicaciones generales Realcese los estudios de rutina de [address], dentales y de Wellsite geologist. Mantngase al da con las vacunas. Infrmele a su mdico si: Se siente deprimida con frecuencia. Alguna vez ha sido vctima de [place] o no se siente seguro en su casa. Resumen Adoptar un estilo de vida saludable y recibir atencin preventiva son importantes para promover la salud y Counsellor. Siga las instrucciones del mdico acerca de una dieta saludable, el ejercicio y la realizacin de pruebas o exmenes para Hotel manager. Siga las instrucciones del mdico con respecto al control del colesterol y la presin arterial. Esta informacin no tiene Theme park manager el consejo del mdico. Asegrese de hacerle al mdico cualquier pregunta que tenga. Document Revised: 02/01/2021 Document Reviewed: 02/01/2021 Elsevier Patient Education  2024 [employer].

## 2024-10-07 NOTE — Assessment & Plan Note (Signed)
 Hemoglobin A1c today at 5.8 Diet and nutrition discussed Cardiovascular risks associated with diabetes and dyslipidemia discussed Blood work done today Follow-up in 6 months

## 2024-10-08 ENCOUNTER — Other Ambulatory Visit: Payer: Self-pay | Admitting: Emergency Medicine

## 2024-10-08 DIAGNOSIS — Z1211 Encounter for screening for malignant neoplasm of colon: Secondary | ICD-10-CM

## 2024-10-08 NOTE — Telephone Encounter (Signed)
 Referral placed.  Thanks

## 2024-10-08 NOTE — Telephone Encounter (Signed)
 Yes to another referral?

## 2025-04-06 ENCOUNTER — Ambulatory Visit: Admitting: Emergency Medicine

## 2025-10-03 ENCOUNTER — Ambulatory Visit: Payer: Self-pay | Admitting: Obstetrics and Gynecology
